# Patient Record
Sex: Female | Born: 1955 | Race: White | Hispanic: No | Marital: Married | Smoking: Former smoker
Health system: Southern US, Community
[De-identification: ages and names within clinical notes are randomized; demographics above are authoritative.]

## PROBLEM LIST (undated history)

## (undated) DIAGNOSIS — M1990 Unspecified osteoarthritis, unspecified site: Secondary | ICD-10-CM

## (undated) DIAGNOSIS — N200 Calculus of kidney: Secondary | ICD-10-CM

## (undated) DIAGNOSIS — A310 Pulmonary mycobacterial infection: Secondary | ICD-10-CM

## (undated) DIAGNOSIS — I7300 Raynaud's syndrome without gangrene: Secondary | ICD-10-CM

## (undated) DIAGNOSIS — B4481 Allergic bronchopulmonary aspergillosis: Secondary | ICD-10-CM

## (undated) DIAGNOSIS — I480 Paroxysmal atrial fibrillation: Secondary | ICD-10-CM

## (undated) DIAGNOSIS — M797 Fibromyalgia: Secondary | ICD-10-CM

## (undated) DIAGNOSIS — L4050 Arthropathic psoriasis, unspecified: Secondary | ICD-10-CM

## (undated) DIAGNOSIS — Z9221 Personal history of antineoplastic chemotherapy: Secondary | ICD-10-CM

## (undated) DIAGNOSIS — M549 Dorsalgia, unspecified: Secondary | ICD-10-CM

## (undated) DIAGNOSIS — J45909 Unspecified asthma, uncomplicated: Secondary | ICD-10-CM

## (undated) DIAGNOSIS — I341 Nonrheumatic mitral (valve) prolapse: Secondary | ICD-10-CM

## (undated) DIAGNOSIS — M329 Systemic lupus erythematosus, unspecified: Secondary | ICD-10-CM

## (undated) DIAGNOSIS — F419 Anxiety disorder, unspecified: Secondary | ICD-10-CM

## (undated) DIAGNOSIS — M3500 Sicca syndrome, unspecified: Secondary | ICD-10-CM

## (undated) DIAGNOSIS — I499 Cardiac arrhythmia, unspecified: Secondary | ICD-10-CM

## (undated) DIAGNOSIS — IMO0002 Reserved for concepts with insufficient information to code with codable children: Secondary | ICD-10-CM

## (undated) DIAGNOSIS — K5790 Diverticulosis of intestine, part unspecified, without perforation or abscess without bleeding: Secondary | ICD-10-CM

## (undated) DIAGNOSIS — M810 Age-related osteoporosis without current pathological fracture: Secondary | ICD-10-CM

## (undated) DIAGNOSIS — J449 Chronic obstructive pulmonary disease, unspecified: Secondary | ICD-10-CM

## (undated) DIAGNOSIS — R569 Unspecified convulsions: Secondary | ICD-10-CM

## (undated) DIAGNOSIS — F32A Depression, unspecified: Secondary | ICD-10-CM

## (undated) DIAGNOSIS — K589 Irritable bowel syndrome without diarrhea: Secondary | ICD-10-CM

## (undated) DIAGNOSIS — G43909 Migraine, unspecified, not intractable, without status migrainosus: Secondary | ICD-10-CM

## (undated) DIAGNOSIS — K219 Gastro-esophageal reflux disease without esophagitis: Secondary | ICD-10-CM

## (undated) DIAGNOSIS — R011 Cardiac murmur, unspecified: Secondary | ICD-10-CM

## (undated) HISTORY — PX: CHOLECYSTECTOMY: SHX55

## (undated) HISTORY — PX: BACK SURGERY: SHX140

## (undated) HISTORY — PX: BREAST BIOPSY: SHX20

## (undated) HISTORY — PX: ABDOMINAL HYSTERECTOMY: SHX81

## (undated) HISTORY — PX: OOPHORECTOMY: SHX86

## (undated) HISTORY — PX: TUBAL LIGATION: SHX77

## (undated) HISTORY — DX: Pulmonary mycobacterial infection: A31.0

---
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
## 1982-09-26 DIAGNOSIS — Z9221 Personal history of antineoplastic chemotherapy: Secondary | ICD-10-CM

## 1982-09-26 HISTORY — DX: Personal history of antineoplastic chemotherapy: Z92.21

## 2004-02-26 ENCOUNTER — Other Ambulatory Visit: Payer: Self-pay

## 2004-08-09 ENCOUNTER — Emergency Department: Payer: Self-pay | Admitting: General Practice

## 2004-08-13 ENCOUNTER — Ambulatory Visit: Payer: Self-pay | Admitting: Internal Medicine

## 2004-08-13 ENCOUNTER — Ambulatory Visit: Payer: Self-pay | Admitting: Cardiology

## 2004-08-18 ENCOUNTER — Emergency Department: Payer: Self-pay | Admitting: Emergency Medicine

## 2004-11-18 ENCOUNTER — Emergency Department: Payer: Self-pay | Admitting: Emergency Medicine

## 2004-11-18 ENCOUNTER — Other Ambulatory Visit: Payer: Self-pay

## 2004-11-19 ENCOUNTER — Ambulatory Visit: Payer: Self-pay | Admitting: Emergency Medicine

## 2005-01-06 ENCOUNTER — Ambulatory Visit: Payer: Self-pay | Admitting: Internal Medicine

## 2005-04-15 ENCOUNTER — Emergency Department: Payer: Self-pay | Admitting: Emergency Medicine

## 2005-04-16 ENCOUNTER — Ambulatory Visit: Payer: Self-pay | Admitting: Emergency Medicine

## 2005-04-21 ENCOUNTER — Ambulatory Visit: Payer: Self-pay | Admitting: Unknown Physician Specialty

## 2005-05-03 ENCOUNTER — Ambulatory Visit: Payer: Self-pay | Admitting: Unknown Physician Specialty

## 2005-05-25 ENCOUNTER — Ambulatory Visit: Payer: Self-pay | Admitting: General Surgery

## 2005-11-20 ENCOUNTER — Other Ambulatory Visit: Payer: Self-pay

## 2005-11-20 ENCOUNTER — Emergency Department: Payer: Self-pay | Admitting: Emergency Medicine

## 2006-01-08 ENCOUNTER — Emergency Department: Payer: Self-pay | Admitting: Emergency Medicine

## 2006-12-18 ENCOUNTER — Inpatient Hospital Stay: Payer: Self-pay | Admitting: Internal Medicine

## 2007-05-12 ENCOUNTER — Ambulatory Visit: Payer: Self-pay | Admitting: Rheumatology

## 2007-07-30 ENCOUNTER — Other Ambulatory Visit: Payer: Self-pay

## 2007-07-30 ENCOUNTER — Emergency Department: Payer: Self-pay | Admitting: Emergency Medicine

## 2008-02-05 ENCOUNTER — Emergency Department (HOSPITAL_COMMUNITY): Admission: EM | Admit: 2008-02-05 | Discharge: 2008-02-06 | Payer: Self-pay | Admitting: Emergency Medicine

## 2008-02-05 ENCOUNTER — Emergency Department: Payer: Self-pay | Admitting: Emergency Medicine

## 2008-03-16 ENCOUNTER — Emergency Department (HOSPITAL_COMMUNITY): Admission: EM | Admit: 2008-03-16 | Discharge: 2008-03-16 | Payer: Self-pay | Admitting: Emergency Medicine

## 2008-03-17 ENCOUNTER — Emergency Department (HOSPITAL_COMMUNITY): Admission: EM | Admit: 2008-03-17 | Discharge: 2008-03-17 | Payer: Self-pay | Admitting: Emergency Medicine

## 2008-03-30 ENCOUNTER — Observation Stay: Payer: Self-pay | Admitting: Internal Medicine

## 2008-03-30 ENCOUNTER — Other Ambulatory Visit: Payer: Self-pay

## 2008-05-06 ENCOUNTER — Encounter: Payer: Self-pay | Admitting: Neurology

## 2008-08-07 ENCOUNTER — Emergency Department (HOSPITAL_COMMUNITY): Admission: EM | Admit: 2008-08-07 | Discharge: 2008-08-07 | Payer: Self-pay | Admitting: Emergency Medicine

## 2008-09-20 ENCOUNTER — Emergency Department (HOSPITAL_COMMUNITY): Admission: EM | Admit: 2008-09-20 | Discharge: 2008-09-20 | Payer: Self-pay | Admitting: Emergency Medicine

## 2009-02-19 ENCOUNTER — Emergency Department (HOSPITAL_COMMUNITY): Admission: EM | Admit: 2009-02-19 | Discharge: 2009-02-20 | Payer: Self-pay | Admitting: Emergency Medicine

## 2009-03-04 ENCOUNTER — Emergency Department (HOSPITAL_COMMUNITY): Admission: EM | Admit: 2009-03-04 | Discharge: 2009-03-04 | Payer: Self-pay | Admitting: Family Medicine

## 2009-03-24 ENCOUNTER — Emergency Department (HOSPITAL_COMMUNITY): Admission: EM | Admit: 2009-03-24 | Discharge: 2009-03-24 | Payer: Self-pay | Admitting: Emergency Medicine

## 2009-05-19 ENCOUNTER — Other Ambulatory Visit: Payer: Self-pay

## 2009-05-19 ENCOUNTER — Ambulatory Visit: Payer: Self-pay | Admitting: Family Medicine

## 2009-05-23 ENCOUNTER — Telehealth: Payer: Self-pay | Admitting: Gastroenterology

## 2009-05-26 ENCOUNTER — Emergency Department (HOSPITAL_COMMUNITY): Admission: EM | Admit: 2009-05-26 | Discharge: 2009-05-26 | Payer: Self-pay | Admitting: Emergency Medicine

## 2009-05-26 ENCOUNTER — Encounter: Admission: RE | Admit: 2009-05-26 | Discharge: 2009-05-26 | Payer: Self-pay | Admitting: Gastroenterology

## 2009-09-19 ENCOUNTER — Emergency Department (HOSPITAL_COMMUNITY): Admission: EM | Admit: 2009-09-19 | Discharge: 2009-09-19 | Payer: Self-pay | Admitting: Emergency Medicine

## 2010-02-06 ENCOUNTER — Emergency Department (HOSPITAL_COMMUNITY): Admission: EM | Admit: 2010-02-06 | Discharge: 2010-02-06 | Payer: Self-pay | Admitting: Emergency Medicine

## 2010-03-10 ENCOUNTER — Ambulatory Visit: Payer: Self-pay | Admitting: Internal Medicine

## 2010-12-14 LAB — BASIC METABOLIC PANEL
BUN: 4 mg/dL — ABNORMAL LOW (ref 6–23)
CO2: 31 mEq/L (ref 19–32)
Calcium: 8.9 mg/dL (ref 8.4–10.5)
Chloride: 104 mEq/L (ref 96–112)
Creatinine, Ser: 0.51 mg/dL (ref 0.4–1.2)
GFR calc Af Amer: >60 mL/min (ref >60)
GFR calc non Af Amer: >60 mL/min (ref >60)
Glucose, Bld: 96 mg/dL (ref 70–99)
Potassium: 4.3 mEq/L (ref 3.5–5.1)
Sodium: 142 mEq/L (ref 135–145)

## 2010-12-14 LAB — URINALYSIS, ROUTINE W REFLEX MICROSCOPIC
Bilirubin Urine: NEGATIVE
Glucose, UA: NEGATIVE mg/dL
Hgb urine dipstick: NEGATIVE
Ketones, ur: NEGATIVE mg/dL
Nitrite: NEGATIVE
Protein, ur: NEGATIVE mg/dL
Specific Gravity, Urine: 1.009 (ref 1.005–1.030)
Urobilinogen, UA: 0.2 mg/dL (ref 0.0–1.0)
pH: 6 (ref 5.0–8.0)

## 2010-12-14 LAB — CBC
HCT: 42.5 % (ref 36.0–46.0)
Hemoglobin: 14.6 g/dL (ref 12.0–15.0)
MCHC: 34.3 g/dL (ref 30.0–36.0)
MCV: 92 fL (ref 78.0–100.0)
Platelets: 250 10*3/uL (ref 150–400)
RBC: 4.62 MIL/uL (ref 3.87–5.11)
RDW: 12.5 % (ref 11.5–15.5)
WBC: 8.5 10*3/uL (ref 4.0–10.5)

## 2010-12-14 LAB — DIFFERENTIAL
Basophils Absolute: 0 10*3/uL (ref 0.0–0.1)
Basophils Relative: 0 % (ref 0–1)
Eosinophils Absolute: 0.1 10*3/uL (ref 0.0–0.7)
Eosinophils Relative: 1 % (ref 0–5)
Lymphocytes Relative: 36 % (ref 12–46)
Lymphs Abs: 3 10*3/uL (ref 0.7–4.0)
Monocytes Absolute: 0.6 10*3/uL (ref 0.1–1.0)
Monocytes Relative: 7 % (ref 3–12)
Neutro Abs: 4.7 10*3/uL (ref 1.7–7.7)
Neutrophils Relative %: 55 % (ref 43–77)

## 2011-01-01 LAB — BASIC METABOLIC PANEL
BUN: 4 mg/dL — ABNORMAL LOW (ref 6–23)
CO2: 28 mEq/L (ref 19–32)
Calcium: 9.5 mg/dL (ref 8.4–10.5)
Chloride: 102 mEq/L (ref 96–112)
Creatinine, Ser: 0.49 mg/dL (ref 0.4–1.2)
GFR calc Af Amer: >60 mL/min (ref >60)
GFR calc non Af Amer: >60 mL/min (ref >60)
Glucose, Bld: 89 mg/dL (ref 70–99)
Potassium: 4.2 mEq/L (ref 3.5–5.1)
Sodium: 139 mEq/L (ref 135–145)

## 2011-01-01 LAB — POCT CARDIAC MARKERS
CKMB, poc: <1 ng/mL — ABNORMAL LOW (ref 1.0–8.0)
CKMB, poc: <1 ng/mL — ABNORMAL LOW (ref 1.0–8.0)
Myoglobin, poc: 58.3 ng/mL (ref 12–200)
Myoglobin, poc: 72.7 ng/mL (ref 12–200)
Troponin i, poc: <0.05 ng/mL (ref 0.00–0.09)
Troponin i, poc: <0.05 ng/mL (ref 0.00–0.09)

## 2011-01-01 LAB — CBC
HCT: 45.3 % (ref 36.0–46.0)
Hemoglobin: 15.4 g/dL — ABNORMAL HIGH (ref 12.0–15.0)
MCHC: 34.1 g/dL (ref 30.0–36.0)
MCV: 91.2 fL (ref 78.0–100.0)
Platelets: 264 10*3/uL (ref 150–400)
RBC: 4.97 MIL/uL (ref 3.87–5.11)
RDW: 12.9 % (ref 11.5–15.5)
WBC: 11.7 10*3/uL — ABNORMAL HIGH (ref 4.0–10.5)

## 2011-01-01 LAB — DIFFERENTIAL
Basophils Absolute: 0 10*3/uL (ref 0.0–0.1)
Basophils Relative: 0 % (ref 0–1)
Eosinophils Absolute: 0 10*3/uL (ref 0.0–0.7)
Eosinophils Relative: 0 % (ref 0–5)
Lymphocytes Relative: 27 % (ref 12–46)
Lymphs Abs: 3.2 10*3/uL (ref 0.7–4.0)
Monocytes Absolute: 0.7 10*3/uL (ref 0.1–1.0)
Monocytes Relative: 6 % (ref 3–12)
Neutro Abs: 7.7 10*3/uL (ref 1.7–7.7)
Neutrophils Relative %: 66 % (ref 43–77)

## 2011-01-03 LAB — GLUCOSE, CAPILLARY: Glucose-Capillary: 75 mg/dL (ref 70–99)

## 2011-01-03 LAB — URINE CULTURE: Colony Count: 4000

## 2011-01-03 LAB — COMPREHENSIVE METABOLIC PANEL
ALT: 21 U/L (ref 0–35)
AST: 28 U/L (ref 0–37)
Albumin: 3.5 g/dL (ref 3.5–5.2)
Alkaline Phosphatase: 82 U/L (ref 39–117)
BUN: 5 mg/dL — ABNORMAL LOW (ref 6–23)
CO2: 26 mEq/L (ref 19–32)
Calcium: 9 mg/dL (ref 8.4–10.5)
Chloride: 104 mEq/L (ref 96–112)
Creatinine, Ser: 0.64 mg/dL (ref 0.4–1.2)
GFR calc Af Amer: >60 mL/min (ref >60)
GFR calc non Af Amer: >60 mL/min (ref >60)
Glucose, Bld: 96 mg/dL (ref 70–99)
Potassium: 4.6 mEq/L (ref 3.5–5.1)
Sodium: 140 mEq/L (ref 135–145)
Total Bilirubin: 1.3 mg/dL — ABNORMAL HIGH (ref 0.3–1.2)
Total Protein: 7.4 g/dL (ref 6.0–8.3)

## 2011-01-03 LAB — DIFFERENTIAL
Basophils Absolute: 0 10*3/uL (ref 0.0–0.1)
Basophils Relative: 0 % (ref 0–1)
Eosinophils Absolute: 0 10*3/uL (ref 0.0–0.7)
Eosinophils Relative: 0 % (ref 0–5)
Lymphocytes Relative: 16 % (ref 12–46)
Lymphs Abs: 2.2 10*3/uL (ref 0.7–4.0)
Monocytes Absolute: 0.9 10*3/uL (ref 0.1–1.0)
Monocytes Relative: 7 % (ref 3–12)
Neutro Abs: 10.3 10*3/uL — ABNORMAL HIGH (ref 1.7–7.7)
Neutrophils Relative %: 76 % (ref 43–77)

## 2011-01-03 LAB — CBC
HCT: 41.9 % (ref 36.0–46.0)
Hemoglobin: 14.3 g/dL (ref 12.0–15.0)
MCHC: 34.2 g/dL (ref 30.0–36.0)
MCV: 91 fL (ref 78.0–100.0)
Platelets: 255 10*3/uL (ref 150–400)
RBC: 4.6 MIL/uL (ref 3.87–5.11)
RDW: 13.1 % (ref 11.5–15.5)
WBC: 13.5 10*3/uL — ABNORMAL HIGH (ref 4.0–10.5)

## 2011-01-03 LAB — URINALYSIS, ROUTINE W REFLEX MICROSCOPIC
Bilirubin Urine: NEGATIVE
Glucose, UA: NEGATIVE mg/dL
Hgb urine dipstick: NEGATIVE
Ketones, ur: NEGATIVE mg/dL
Nitrite: NEGATIVE
Protein, ur: NEGATIVE mg/dL
Specific Gravity, Urine: 1.007 (ref 1.005–1.030)
Urobilinogen, UA: 0.2 mg/dL (ref 0.0–1.0)
pH: 8 (ref 5.0–8.0)

## 2011-01-05 ENCOUNTER — Ambulatory Visit: Payer: Self-pay | Admitting: Internal Medicine

## 2011-01-28 ENCOUNTER — Encounter: Payer: Self-pay | Admitting: Orthopedic Surgery

## 2011-02-08 NOTE — Consult Note (Signed)
NAMESAMAR, VENNEMAN            ACCOUNT NO.:  [id number]   MEDICAL RECORD NO.:  [id number]          PATIENT TYPE:  EMS   LOCATION:  [facility]                         FACILITY:  [facility]   PHYSICIAN:  Della Goo, M.D. DATE OF BIRTH:  [birth year]   DATE OF CONSULTATION:  09/20/2008  DATE OF DISCHARGE:                                 CONSULTATION   CHIEF COMPLAINT:  Shortness of breath and wheezing.   HISTORY OF PRESENT ILLNESS:  This is a 55 year old female who presents  to the emergency department secondary to complaints of increasing  shortness of breath, cough and wheezing.  The patient has a history of  asthma and reports being seen at [facility] 2 days ago and being  evaluated and found to have a viral bronchitis.  She presented to the  emergency department at [facility] secondary to pleuritic chest  discomfort which she reports is waxing and waning.  The patient was  evaluated in the emergency department and was noticed to have an  abnormal electrocardiographic tracing.  Cardiac enzymes were started and  the patient was retained for further evaluation.  The patient was  treated for her symptoms with pain medication.  The patient had point of  care cardiac markers performed, two of which were negative, and an EKG  performed which revealed upward sloping of the ST-segment and this  abnormality was discussed with the patient who reports that this is a  chronic finding of which she has had multiple cardiac evaluations and  reports having a Myoview study performed in July of 2009 with normal  findings.  The patient reports that she also has followup with a  cardiologist at [facility] and a cardiologist in [place] who is  her primary care physician.  The patient denies having any fevers,  chills, but does report coughing up a clear mucus.  She also reports  having an elevated white count, however, reports the white count has  improved from the report 2 days ago  at [facility].   PAST MEDICAL HISTORY:  Significant for asthma, arthritis, atrial  fibrillation, mitral valve prolapse.  The patient has also a history of  scoliosis and is status post Harrington rod placement.   PAST SURGICAL HISTORY:  History of a cholecystectomy.  Please at the  past medical history.   ALLERGIES:  The patient has multiple listed and documented allergies to  CEPHALOSPORIN, SULFA, AMOXICILLIN, VANCOMYCIN, AZITHROMYCIN,  METRONIDAZOLE, VANCOMYCIN, PREDNISONE, VICODIN, FLUOROQUINOLONES along  with ALBUTEROL.   MEDICATIONS:  1. Her medications at this time include Flonase.  2. Magnesium.  3. Aspirin.  4. Motrin.  5. Multivitamin.  6. Flexhaler 2 puffs b.i.d.  7. Tenormin.  8. Pulmicort.  9. Xanax.  10.Protonix.  11.She was recently started on Tamiflu therapy.   SOCIAL HISTORY:  The patient is married.  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  She is a nonsmoker, nondrinker.   FAMILY HISTORY:  Noncontributory.   PHYSICAL EXAMINATION:  GENERAL:  This is a 55 year old well-nourished,  well-developed female in no discomfort or acute distress.  VITAL SIGNS:  Her  vital signs are temperature 98.6, blood pressure  142/86, heart rate 85, respirations 16.  HEENT:  Normocephalic, atraumatic.  Pupils equal, round, reactive to  light.  Extraocular movements are intact.  Funduscopic benign.  Oropharynx is clear.  No exudates, erythema.  No tonsillar adenopathy.  NECK:  Supple for range of motion.  No thyromegaly, adenopathy, jugular  venous distention.  No carotid bruits.  CARDIOVASCULAR:  Regular rate and rhythm.  No murmurs, gallops or rubs  appreciated.  LUNGS:  Lungs are clear to auscultation bilaterally.  No rales, rhonchi  or wheezes.  ABDOMEN:  Positive bowel sounds, soft, nontender, nondistended.  EXTREMITIES:  Without cyanosis, clubbing or edema.  NEUROLOGICAL:  Alert and oriented x3.  Cranial nerves are intact.  Motor  and sensory function  also intact and gait is steady.   LABORATORY STUDIES:  White blood cell count 10.7, hemoglobin 14.7,  hematocrit 45.0, platelets 333.  Sodium 140, potassium 4.4, chloride  103, bicarb 30, BUN 7, creatinine 0.8 and glucose 82.  Point of care  cardiac markers with a myoglobin level 47.6, a CK-MB less than 1.0 and  troponin less than 0.05.  A second set of point of care cardiac markers  revealed a myoglobin of 43.6, a CK-MB of less than 1.0 and a troponin of  less than 0.05.  Beta natriuretic peptide less than 30.0.  EKG reveals a  normal sinus rhythm with diffuse ST elevation with upward  sloping/concavity in the ST-segment.  Chest x-ray reveals no acute  disease process and hyperaeration of both lungs.  The patient has  dextroscoliosis and the Harrington rod is present in the lower thoracic  and lumbar region.   ASSESSMENT:  A 55 year old female with pleuritic chest pain and viral  bronchitis/mild asthma exacerbation.   PLAN:  The results of the patient's cardiac studies have been discussed  with the patient and the patient will follow up with her cardiologist in  [facility] and her cardiologist at [facility].  The  patient also understands that if her symptoms worsen that she is to  return to the emergency department for reevaluation.  The patient will  continue on her guaifenesin that she has been taking q.6 hours for  coughing.  She will also continue on her flexhaler as she had been  instructed by her pulmonologist.      Della Goo, M.D.  Electronically Signed     HJ/MEDQ  D:  09/21/2008  T:  09/21/2008  Job:  [id number]

## 2011-02-25 ENCOUNTER — Encounter: Payer: Self-pay | Admitting: Orthopedic Surgery

## 2011-03-12 ENCOUNTER — Emergency Department (HOSPITAL_COMMUNITY): Payer: Medicare Other

## 2011-03-12 ENCOUNTER — Emergency Department (HOSPITAL_COMMUNITY)
Admission: EM | Admit: 2011-03-12 | Discharge: 2011-03-12 | Disposition: A | Discharge status: Home / Self Care | Payer: Medicare Other | Attending: Emergency Medicine | Admitting: Emergency Medicine

## 2011-03-12 DIAGNOSIS — I4891 Unspecified atrial fibrillation: Secondary | ICD-10-CM | POA: Insufficient documentation

## 2011-03-12 DIAGNOSIS — Z7982 Long term (current) use of aspirin: Secondary | ICD-10-CM | POA: Insufficient documentation

## 2011-03-12 DIAGNOSIS — M3500 Sicca syndrome, unspecified: Secondary | ICD-10-CM | POA: Insufficient documentation

## 2011-03-12 DIAGNOSIS — Z8541 Personal history of malignant neoplasm of cervix uteri: Secondary | ICD-10-CM | POA: Insufficient documentation

## 2011-03-12 DIAGNOSIS — K589 Irritable bowel syndrome without diarrhea: Secondary | ICD-10-CM | POA: Insufficient documentation

## 2011-03-12 DIAGNOSIS — I498 Other specified cardiac arrhythmias: Secondary | ICD-10-CM | POA: Insufficient documentation

## 2011-03-12 DIAGNOSIS — J45909 Unspecified asthma, uncomplicated: Secondary | ICD-10-CM | POA: Insufficient documentation

## 2011-03-12 DIAGNOSIS — Z79899 Other long term (current) drug therapy: Secondary | ICD-10-CM | POA: Insufficient documentation

## 2011-03-12 DIAGNOSIS — R002 Palpitations: Secondary | ICD-10-CM | POA: Insufficient documentation

## 2011-03-12 LAB — DIFFERENTIAL
Basophils Absolute: 0 10*3/uL (ref 0.0–0.1)
Basophils Relative: 0 % (ref 0–1)
Eosinophils Absolute: 0 10*3/uL (ref 0.0–0.7)
Eosinophils Relative: 0 % (ref 0–5)
Lymphocytes Relative: 38 % (ref 12–46)
Lymphs Abs: 4.9 10*3/uL — ABNORMAL HIGH (ref 0.7–4.0)
Monocytes Absolute: 0.8 10*3/uL (ref 0.1–1.0)
Monocytes Relative: 7 % (ref 3–12)
Neutro Abs: 6.9 10*3/uL (ref 1.7–7.7)
Neutrophils Relative %: 54 % (ref 43–77)

## 2011-03-12 LAB — ETHANOL: Alcohol, Ethyl (B): <11 mg/dL (ref 0–11)

## 2011-03-12 LAB — URINALYSIS, ROUTINE W REFLEX MICROSCOPIC
Bilirubin Urine: NEGATIVE
Glucose, UA: NEGATIVE mg/dL
Hgb urine dipstick: NEGATIVE
Ketones, ur: >80 mg/dL — AB
Leukocytes, UA: NEGATIVE
Nitrite: NEGATIVE
Protein, ur: NEGATIVE mg/dL
Specific Gravity, Urine: 1.009 (ref 1.005–1.030)
Urobilinogen, UA: 0.2 mg/dL (ref 0.0–1.0)
pH: 8 (ref 5.0–8.0)

## 2011-03-12 LAB — CBC
HCT: 47.9 % — ABNORMAL HIGH (ref 36.0–46.0)
Hemoglobin: 17 g/dL — ABNORMAL HIGH (ref 12.0–15.0)
MCH: 32 pg (ref 26.0–34.0)
MCHC: 35.5 g/dL (ref 30.0–36.0)
MCV: 90.2 fL (ref 78.0–100.0)
Platelets: 311 10*3/uL (ref 150–400)
RBC: 5.31 MIL/uL — ABNORMAL HIGH (ref 3.87–5.11)
RDW: 12.7 % (ref 11.5–15.5)
WBC: 12.7 10*3/uL — ABNORMAL HIGH (ref 4.0–10.5)

## 2011-03-12 LAB — BASIC METABOLIC PANEL
BUN: 9 mg/dL (ref 6–23)
CO2: 25 mEq/L (ref 19–32)
Calcium: 10 mg/dL (ref 8.4–10.5)
Chloride: 101 mEq/L (ref 96–112)
Creatinine, Ser: 0.5 mg/dL (ref 0.50–1.10)
GFR calc Af Amer: >60 mL/min (ref >60)
GFR calc non Af Amer: >60 mL/min (ref >60)
Glucose, Bld: 95 mg/dL (ref 70–99)
Potassium: 3.3 mEq/L — ABNORMAL LOW (ref 3.5–5.1)
Sodium: 139 mEq/L (ref 135–145)

## 2011-03-12 LAB — RAPID URINE DRUG SCREEN, HOSP PERFORMED
Amphetamines: NOT DETECTED
Barbiturates: NOT DETECTED
Benzodiazepines: POSITIVE — AB
Cocaine: NOT DETECTED
Opiates: NOT DETECTED
Tetrahydrocannabinol: NOT DETECTED

## 2011-03-12 LAB — APTT: aPTT: 34 seconds (ref 24–37)

## 2011-03-12 LAB — CK TOTAL AND CKMB (NOT AT ARMC)
CK, MB: 3 ng/mL (ref 0.3–4.0)
Relative Index: 1.9 (ref 0.0–2.5)
Total CK: 159 U/L (ref 7–177)

## 2011-03-12 LAB — PROTIME-INR
INR: 0.93 (ref 0.00–1.49)
Prothrombin Time: 12.7 seconds (ref 11.6–15.2)

## 2011-03-12 LAB — TROPONIN I: Troponin I: <0.3 ng/mL (ref <0.30)

## 2011-03-27 ENCOUNTER — Encounter: Payer: Self-pay | Admitting: Orthopedic Surgery

## 2011-06-28 LAB — COMPREHENSIVE METABOLIC PANEL
ALT: 24
AST: 29
Albumin: 3.7
Alkaline Phosphatase: 83
BUN: 6
CO2: 28
Calcium: 9.5
Chloride: 97
Creatinine, Ser: 0.66
GFR calc Af Amer: >60
GFR calc non Af Amer: >60
Glucose, Bld: 93
Potassium: 4.3
Sodium: 137
Total Bilirubin: 0.8
Total Protein: 7.2

## 2011-06-28 LAB — CBC
HCT: 43.3
Hemoglobin: 14.6
MCHC: 33.6
MCV: 91.2
Platelets: 278
RBC: 4.75
RDW: 13
WBC: 9.7

## 2011-06-28 LAB — DIFFERENTIAL
Basophils Absolute: 0
Basophils Relative: 0
Eosinophils Absolute: 0.1
Eosinophils Relative: 1
Lymphocytes Relative: 24
Lymphs Abs: 2.3
Monocytes Absolute: 0.6
Monocytes Relative: 6
Neutro Abs: 6.7
Neutrophils Relative %: 69

## 2011-06-28 LAB — URINALYSIS, ROUTINE W REFLEX MICROSCOPIC
Bilirubin Urine: NEGATIVE
Glucose, UA: NEGATIVE
Hgb urine dipstick: NEGATIVE
Ketones, ur: NEGATIVE
Nitrite: NEGATIVE
Protein, ur: NEGATIVE
Specific Gravity, Urine: 1.007
Urobilinogen, UA: 0.2
pH: 6.5

## 2011-06-28 LAB — LIPASE, BLOOD: Lipase: 28

## 2011-07-01 LAB — POCT I-STAT, CHEM 8
BUN: 7 mg/dL (ref 6–23)
Calcium, Ion: 1.13 mmol/L (ref 1.12–1.32)
Chloride: 103 mEq/L (ref 96–112)
Creatinine, Ser: 0.8 mg/dL (ref 0.4–1.2)
Glucose, Bld: 82 mg/dL (ref 70–99)
HCT: 46 % (ref 36.0–46.0)
Hemoglobin: 15.6 g/dL — ABNORMAL HIGH (ref 12.0–15.0)
Potassium: 4.4 mEq/L (ref 3.5–5.1)
Sodium: 140 mEq/L (ref 135–145)
TCO2: 30 mmol/L (ref 0–100)

## 2011-07-01 LAB — POCT CARDIAC MARKERS
CKMB, poc: <1 ng/mL — ABNORMAL LOW (ref 1.0–8.0)
CKMB, poc: <1 ng/mL — ABNORMAL LOW (ref 1.0–8.0)
Myoglobin, poc: 43.6 ng/mL (ref 12–200)
Myoglobin, poc: 47.6 ng/mL (ref 12–200)
Troponin i, poc: <0.05 ng/mL (ref 0.00–0.09)
Troponin i, poc: <0.05 ng/mL (ref 0.00–0.09)

## 2011-07-01 LAB — CBC
HCT: 45 % (ref 36.0–46.0)
Hemoglobin: 14.7 g/dL (ref 12.0–15.0)
MCHC: 32.8 g/dL (ref 30.0–36.0)
MCV: 91.9 fL (ref 78.0–100.0)
Platelets: 333 10*3/uL (ref 150–400)
RBC: 4.89 MIL/uL (ref 3.87–5.11)
RDW: 13 % (ref 11.5–15.5)
WBC: 10.7 10*3/uL — ABNORMAL HIGH (ref 4.0–10.5)

## 2011-07-01 LAB — B-NATRIURETIC PEPTIDE (CONVERTED LAB): Pro B Natriuretic peptide (BNP): <30 pg/mL (ref 0.0–100.0)

## 2011-07-04 ENCOUNTER — Encounter (HOSPITAL_COMMUNITY): Payer: Self-pay | Admitting: Radiology

## 2011-07-04 ENCOUNTER — Emergency Department (HOSPITAL_COMMUNITY): Payer: Medicare Other

## 2011-07-04 ENCOUNTER — Emergency Department (HOSPITAL_COMMUNITY)
Admission: EM | Admit: 2011-07-04 | Discharge: 2011-07-04 | Disposition: A | Discharge status: Home / Self Care | Payer: Medicare Other | Attending: Emergency Medicine | Admitting: Emergency Medicine

## 2011-07-04 DIAGNOSIS — R1013 Epigastric pain: Secondary | ICD-10-CM | POA: Insufficient documentation

## 2011-07-04 DIAGNOSIS — K589 Irritable bowel syndrome without diarrhea: Secondary | ICD-10-CM | POA: Insufficient documentation

## 2011-07-04 DIAGNOSIS — J45909 Unspecified asthma, uncomplicated: Secondary | ICD-10-CM | POA: Insufficient documentation

## 2011-07-04 DIAGNOSIS — M3500 Sicca syndrome, unspecified: Secondary | ICD-10-CM | POA: Insufficient documentation

## 2011-07-04 DIAGNOSIS — I059 Rheumatic mitral valve disease, unspecified: Secondary | ICD-10-CM | POA: Insufficient documentation

## 2011-07-04 DIAGNOSIS — E876 Hypokalemia: Secondary | ICD-10-CM | POA: Insufficient documentation

## 2011-07-04 DIAGNOSIS — K3189 Other diseases of stomach and duodenum: Secondary | ICD-10-CM | POA: Insufficient documentation

## 2011-07-04 DIAGNOSIS — R1032 Left lower quadrant pain: Secondary | ICD-10-CM | POA: Insufficient documentation

## 2011-07-04 DIAGNOSIS — M4120 Other idiopathic scoliosis, site unspecified: Secondary | ICD-10-CM | POA: Insufficient documentation

## 2011-07-04 DIAGNOSIS — R110 Nausea: Secondary | ICD-10-CM | POA: Insufficient documentation

## 2011-07-04 LAB — DIFFERENTIAL
Basophils Absolute: 0.1 10*3/uL (ref 0.0–0.1)
Basophils Relative: 1 % (ref 0–1)
Eosinophils Absolute: 0.1 10*3/uL (ref 0.0–0.7)
Eosinophils Relative: 1 % (ref 0–5)
Lymphocytes Relative: 36 % (ref 12–46)
Lymphs Abs: 3 10*3/uL (ref 0.7–4.0)
Monocytes Absolute: 0.8 10*3/uL (ref 0.1–1.0)
Monocytes Relative: 9 % (ref 3–12)
Neutro Abs: 4.6 10*3/uL (ref 1.7–7.7)
Neutrophils Relative %: 54 % (ref 43–77)

## 2011-07-04 LAB — COMPREHENSIVE METABOLIC PANEL
ALT: 18 U/L (ref 0–35)
AST: 23 U/L (ref 0–37)
Albumin: 3.4 g/dL — ABNORMAL LOW (ref 3.5–5.2)
Alkaline Phosphatase: 74 U/L (ref 39–117)
BUN: 4 mg/dL — ABNORMAL LOW (ref 6–23)
CO2: 27 mEq/L (ref 19–32)
Calcium: 8.7 mg/dL (ref 8.4–10.5)
Chloride: 104 mEq/L (ref 96–112)
Creatinine, Ser: 0.49 mg/dL — ABNORMAL LOW (ref 0.50–1.10)
GFR calc Af Amer: >90 mL/min (ref >90)
GFR calc non Af Amer: >90 mL/min (ref >90)
Glucose, Bld: 97 mg/dL (ref 70–99)
Potassium: 2.9 mEq/L — ABNORMAL LOW (ref 3.5–5.1)
Sodium: 139 mEq/L (ref 135–145)
Total Bilirubin: 0.6 mg/dL (ref 0.3–1.2)
Total Protein: 6.7 g/dL (ref 6.0–8.3)

## 2011-07-04 LAB — URINALYSIS, ROUTINE W REFLEX MICROSCOPIC
Bilirubin Urine: NEGATIVE
Glucose, UA: NEGATIVE mg/dL
Hgb urine dipstick: NEGATIVE
Ketones, ur: NEGATIVE mg/dL
Leukocytes, UA: NEGATIVE
Nitrite: NEGATIVE
Protein, ur: NEGATIVE mg/dL
Specific Gravity, Urine: 1.002 — ABNORMAL LOW (ref 1.005–1.030)
Urobilinogen, UA: 0.2 mg/dL (ref 0.0–1.0)
pH: 6.5 (ref 5.0–8.0)

## 2011-07-04 LAB — CBC
HCT: 40 % (ref 36.0–46.0)
Hemoglobin: 13.3 g/dL (ref 12.0–15.0)
MCH: 30.2 pg (ref 26.0–34.0)
MCHC: 33.3 g/dL (ref 30.0–36.0)
MCV: 90.7 fL (ref 78.0–100.0)
Platelets: 238 10*3/uL (ref 150–400)
RBC: 4.41 MIL/uL (ref 3.87–5.11)
RDW: 12.4 % (ref 11.5–15.5)
WBC: 8.5 10*3/uL (ref 4.0–10.5)

## 2011-07-04 LAB — LACTIC ACID, PLASMA: Lactic Acid, Venous: 1.2 mmol/L (ref 0.5–2.2)

## 2011-07-04 MED ORDER — IOHEXOL 300 MG/ML  SOLN: 80.0000 mL | Freq: Once | INTRAMUSCULAR | Status: DC | PRN

## 2012-03-31 ENCOUNTER — Emergency Department (HOSPITAL_COMMUNITY)
Admission: EM | Admit: 2012-03-31 | Discharge: 2012-03-31 | Disposition: A | Discharge status: Home / Self Care | Payer: Medicare Other | Attending: Emergency Medicine | Admitting: Emergency Medicine

## 2012-03-31 ENCOUNTER — Emergency Department (HOSPITAL_COMMUNITY): Payer: Medicare Other

## 2012-03-31 ENCOUNTER — Encounter (HOSPITAL_COMMUNITY): Payer: Self-pay | Admitting: Emergency Medicine

## 2012-03-31 DIAGNOSIS — J4489 Other specified chronic obstructive pulmonary disease: Secondary | ICD-10-CM | POA: Insufficient documentation

## 2012-03-31 DIAGNOSIS — S2239XA Fracture of one rib, unspecified side, initial encounter for closed fracture: Secondary | ICD-10-CM

## 2012-03-31 DIAGNOSIS — S2249XA Multiple fractures of ribs, unspecified side, initial encounter for closed fracture: Secondary | ICD-10-CM | POA: Insufficient documentation

## 2012-03-31 DIAGNOSIS — W010XXA Fall on same level from slipping, tripping and stumbling without subsequent striking against object, initial encounter: Secondary | ICD-10-CM | POA: Insufficient documentation

## 2012-03-31 DIAGNOSIS — J449 Chronic obstructive pulmonary disease, unspecified: Secondary | ICD-10-CM | POA: Insufficient documentation

## 2012-03-31 DIAGNOSIS — I059 Rheumatic mitral valve disease, unspecified: Secondary | ICD-10-CM | POA: Insufficient documentation

## 2012-03-31 DIAGNOSIS — T07XXXA Unspecified multiple injuries, initial encounter: Secondary | ICD-10-CM | POA: Insufficient documentation

## 2012-03-31 DIAGNOSIS — Y92009 Unspecified place in unspecified non-institutional (private) residence as the place of occurrence of the external cause: Secondary | ICD-10-CM | POA: Insufficient documentation

## 2012-03-31 HISTORY — DX: Fibromyalgia: M79.7

## 2012-03-31 HISTORY — DX: Dorsalgia, unspecified: M54.9

## 2012-03-31 HISTORY — DX: Nonrheumatic mitral (valve) prolapse: I34.1

## 2012-03-31 HISTORY — DX: Chronic obstructive pulmonary disease, unspecified: J44.9

## 2012-03-31 HISTORY — DX: Cardiac arrhythmia, unspecified: I49.9

## 2012-03-31 MED ORDER — KETOROLAC TROMETHAMINE 60 MG/2ML IM SOLN
60.0000 mg | Freq: Once | INTRAMUSCULAR | Status: AC
  Administered 2012-03-31: 60 mg via INTRAMUSCULAR
  Filled 2012-03-31: qty 2

## 2012-03-31 MED ORDER — KETOROLAC TROMETHAMINE 10 MG PO TABS: 10.0000 mg | ORAL_TABLET | Freq: Four times a day (QID) | ORAL | Status: AC | PRN

## 2012-03-31 NOTE — ED Notes (Signed)
Pt states she fell while changing clothes  At 0100.  Pt states he foot got caught up into her pants and fell into a door jamb and landed on left hip.  C/o right  Rib pain 9/10 and when leaning forward it is a 10/10.  Denies LOC.

## 2012-03-31 NOTE — ED Provider Notes (Signed)
History     CSN: [id number]  Arrival date & time 03/31/12  0219   First MD Initiated Contact with Patient 03/31/12 [phone]      Chief Complaint  Patient presents with  . Fall    (Consider location/radiation/quality/duration/timing/severity/associated sxs/prior treatment) HPI Comments: According to the patient and her spouse she lost her balance while trying to change clothes this evening, fell landing on her right side against a wooden door jam and then to the floor bracing her fall with her right arm and leg. This was acute in onset, and occurred just prior to arrival. Her symptoms are persistent, moderate to severe, worse with taking a deep breath and not associated with neck pain or head injury. She has had no medication prior to arrival.  Patient is a 56 y.o. female presenting with fall. The history is provided by the patient and the spouse.  Fall Pertinent negatives include no fever, no nausea and no vomiting.    Past Medical History  Diagnosis Date  . Asthma   . Fibromyalgia   . Arrhythmia   . Back pain   . Mitral valve prolapse   . Chronic obstructive airway disease     Past Surgical History  Procedure Date  . Back surgery     No family history on file.  History  Substance Use Topics  . Smoking status: Never Smoker   . Smokeless tobacco: Not on file  . Alcohol Use: No    OB History    Grav Para Term Preterm Abortions TAB SAB Ect Mult Living                  Review of Systems  Constitutional: Negative for fever and chills.  Gastrointestinal: Negative for nausea and vomiting.  Musculoskeletal: Negative for back pain.  Skin: Positive for rash.       abscess    Allergies  Vancomycin; Vicodin; Albuterol; Amoxicillin; Cephalosporins; Ciprofloxacin; Macrobid; Sulfa antibiotics; Decongest-aid; and Prednisone  Home Medications   Current Outpatient Rx  Name Route Sig Dispense Refill  . ALPRAZOLAM 0.5 MG PO TABS Oral Take 0.5 mg by mouth 4 (four) times  daily.    . ASPIRIN 81 MG PO CHEW Oral Chew 81 mg by mouth daily.    . ATENOLOL 25 MG PO TABS Oral Take 12.5 mg by mouth daily.    . BUDESONIDE 180 MCG/ACT IN AEPB Inhalation Inhale 1 puff into the lungs 2 (two) times daily.    Marland Kitchen DOXYCYCLINE HYCLATE 100 MG PO TBEC Oral Take 100 mg by mouth 2 (two) times daily.    Marland Kitchen FLUTICASONE PROPIONATE 50 MCG/ACT NA SUSP Nasal Place 2 sprays into the nose daily.    . IPRATROPIUM BROMIDE HFA 17 MCG/ACT IN AERS Inhalation Inhale 2 puffs into the lungs 4 (four) times daily.    . LUBIPROSTONE 8 MCG PO CAPS Oral Take 8 mcg by mouth 2 (two) times daily with a meal.    . MAGNESIUM OXIDE 400 MG PO TABS Oral Take 400 mg by mouth daily.    . ADULT MULTIVITAMIN W/MINERALS CH Oral Take 1 tablet by mouth daily.    . TRAMADOL HCL 50 MG PO TABS Oral Take 50 mg by mouth every 6 (six) hours as needed. For pain    . KETOROLAC TROMETHAMINE 10 MG PO TABS Oral Take 1 tablet (10 mg total) by mouth every 6 (six) hours as needed for pain. 20 tablet 0    BP 153/90  Pulse 77  Temp 97.7  F (36.5 C) (Oral)  SpO2 100%  Physical Exam  Constitutional: She appears well-developed and well-nourished.       Uncomfortable appearing  HENT:  Head: Normocephalic.  Mouth/Throat: Oropharynx is clear and moist.  Eyes: Conjunctivae are normal. No scleral icterus.  Neck: Normal range of motion. Neck supple.  Cardiovascular: Normal rate and regular rhythm.   Pulmonary/Chest: Effort normal and breath sounds normal. No respiratory distress. She has no wheezes. She has no rales. She exhibits tenderness.  Abdominal: Soft. Bowel sounds are normal. She exhibits no distension. There is no tenderness. There is no rebound.       No right upper quadrant or left upper quadrant tenderness  Musculoskeletal: Normal range of motion. She exhibits tenderness (Tender to palpation over the right anterior lateral lower ribs, no crepitus, no subcutaneous emphysema). She exhibits no edema.  Neurological: She is  alert. Coordination normal.       Sensation and motor intact  Skin: Skin is warm and dry. She is not diaphoretic.       Contusions to the right shoulder, biceps, elbow, forearm, knee    ED Course  Procedures (including critical care time)  Labs Reviewed - No data to display Dg Ribs Unilateral W/chest Right  03/31/2012  *RADIOLOGY REPORT*  Clinical Data: Status post fall, with right lower rib pain.  Pain with breathing and motion.  RIGHT RIBS AND CHEST - 3+ VIEW  Comparison: Chest radiograph performed 03/12/2011  Findings: There are minimally displaced fractures of the right anterolateral eighth through tenth ribs.  The lungs are well-aerated and clear.  There is no evidence of focal opacification, pleural effusion or pneumothorax.  The cardiomediastinal silhouette is within normal limits.  No additional osseous abnormalities are seen; a thoracolumbar spinal rod is partially imaged.  Clips are noted within the right upper quadrant, reflecting prior cholecystectomy.  IMPRESSION:  1.  Minimally displaced fractures of the right anterolateral eighth through tenth ribs. 2.  No acute cardiopulmonary process seen.  Original Report Authenticated By: Tonia Ghent, M.D.     1. Rib fractures   2. Contusion of multiple sites       MDM  Multiple signs of contusion, rib fracture suspected based on exam, confirmed on x-ray. Intramuscular Toradol given, patient refuses opiate medications, vital signs normal, oxygenation 100% on room air and no signs of pneumothorax. No significant tenderness in the right upper quadrant to suggest liver injury, anticipate discharge home  Definitive Fracture Care:  Definitive fracture care performred for the ribs.  This included analgesia in the ED, incentive spiromoterly, which have been provided.  I have counseled the pt on possible complications of the fractures and signs and symptoms which would mandate return for further care.  Discharge Prescriptions  include:  Toradol         Vida Roller, MD 03/31/12 [phone]

## 2012-03-31 NOTE — ED Notes (Signed)
C/o 10 /10 right rib pain from fall at home.  Abrasions noted to RLE and RUE.  Family at bedside

## 2012-03-31 NOTE — ED Notes (Signed)
Pt states she lost her balance and fell tonight while changing pants.  C/o R sided rib pain, abrasion noted to R knee and R ankle.

## 2012-03-31 NOTE — ED Notes (Signed)
Instructions given for Incentive spirometry.  Pt did return demonstration of  2000cc times 4 reps

## 2012-06-24 ENCOUNTER — Emergency Department (HOSPITAL_COMMUNITY): Payer: Medicare Other

## 2012-06-24 ENCOUNTER — Encounter (HOSPITAL_COMMUNITY): Payer: Self-pay | Admitting: Physical Medicine and Rehabilitation

## 2012-06-24 ENCOUNTER — Emergency Department (HOSPITAL_COMMUNITY)
Admission: EM | Admit: 2012-06-24 | Discharge: 2012-06-24 | Disposition: A | Discharge status: Home / Self Care | Payer: Medicare Other | Attending: Emergency Medicine | Admitting: Emergency Medicine

## 2012-06-24 DIAGNOSIS — R1031 Right lower quadrant pain: Secondary | ICD-10-CM | POA: Insufficient documentation

## 2012-06-24 DIAGNOSIS — IMO0001 Reserved for inherently not codable concepts without codable children: Secondary | ICD-10-CM | POA: Insufficient documentation

## 2012-06-24 DIAGNOSIS — Z7982 Long term (current) use of aspirin: Secondary | ICD-10-CM | POA: Insufficient documentation

## 2012-06-24 DIAGNOSIS — R109 Unspecified abdominal pain: Secondary | ICD-10-CM

## 2012-06-24 DIAGNOSIS — G8929 Other chronic pain: Secondary | ICD-10-CM | POA: Insufficient documentation

## 2012-06-24 DIAGNOSIS — Z79899 Other long term (current) drug therapy: Secondary | ICD-10-CM | POA: Insufficient documentation

## 2012-06-24 DIAGNOSIS — J4489 Other specified chronic obstructive pulmonary disease: Secondary | ICD-10-CM | POA: Insufficient documentation

## 2012-06-24 DIAGNOSIS — J449 Chronic obstructive pulmonary disease, unspecified: Secondary | ICD-10-CM | POA: Insufficient documentation

## 2012-06-24 HISTORY — DX: Calculus of kidney: N20.0

## 2012-06-24 HISTORY — DX: Irritable bowel syndrome, unspecified: K58.9

## 2012-06-24 LAB — COMPREHENSIVE METABOLIC PANEL
ALT: 21 U/L (ref 0–35)
AST: 32 U/L (ref 0–37)
Albumin: 4.2 g/dL (ref 3.5–5.2)
Alkaline Phosphatase: 84 U/L (ref 39–117)
BUN: 6 mg/dL (ref 6–23)
CO2: 28 mEq/L (ref 19–32)
Calcium: 10 mg/dL (ref 8.4–10.5)
Chloride: 97 mEq/L (ref 96–112)
Creatinine, Ser: 0.66 mg/dL (ref 0.50–1.10)
GFR calc Af Amer: >90 mL/min (ref >90)
GFR calc non Af Amer: >90 mL/min (ref >90)
Glucose, Bld: 88 mg/dL (ref 70–99)
Potassium: 4.4 mEq/L (ref 3.5–5.1)
Sodium: 134 mEq/L — ABNORMAL LOW (ref 135–145)
Total Bilirubin: 1.1 mg/dL (ref 0.3–1.2)
Total Protein: 8.1 g/dL (ref 6.0–8.3)

## 2012-06-24 LAB — URINALYSIS, ROUTINE W REFLEX MICROSCOPIC
Bilirubin Urine: NEGATIVE
Glucose, UA: NEGATIVE mg/dL
Hgb urine dipstick: NEGATIVE
Ketones, ur: NEGATIVE mg/dL
Leukocytes, UA: NEGATIVE
Nitrite: NEGATIVE
Protein, ur: NEGATIVE mg/dL
Specific Gravity, Urine: 1.006 (ref 1.005–1.030)
Urobilinogen, UA: 0.2 mg/dL (ref 0.0–1.0)
pH: 6.5 (ref 5.0–8.0)

## 2012-06-24 LAB — CBC WITH DIFFERENTIAL/PLATELET
Basophils Absolute: 0.1 10*3/uL (ref 0.0–0.1)
Basophils Relative: 1 % (ref 0–1)
Eosinophils Absolute: 0.1 10*3/uL (ref 0.0–0.7)
Eosinophils Relative: 1 % (ref 0–5)
HCT: 46.2 % — ABNORMAL HIGH (ref 36.0–46.0)
Hemoglobin: 15.8 g/dL — ABNORMAL HIGH (ref 12.0–15.0)
Lymphocytes Relative: 32 % (ref 12–46)
Lymphs Abs: 2.7 10*3/uL (ref 0.7–4.0)
MCH: 31.5 pg (ref 26.0–34.0)
MCHC: 34.2 g/dL (ref 30.0–36.0)
MCV: 92.2 fL (ref 78.0–100.0)
Monocytes Absolute: 0.7 10*3/uL (ref 0.1–1.0)
Monocytes Relative: 9 % (ref 3–12)
Neutro Abs: 4.9 10*3/uL (ref 1.7–7.7)
Neutrophils Relative %: 58 % (ref 43–77)
Platelets: 249 10*3/uL (ref 150–400)
RBC: 5.01 MIL/uL (ref 3.87–5.11)
RDW: 12.8 % (ref 11.5–15.5)
WBC: 8.5 10*3/uL (ref 4.0–10.5)

## 2012-06-24 LAB — PREGNANCY, URINE: Preg Test, Ur: NEGATIVE

## 2012-06-24 MED ORDER — SODIUM CHLORIDE 0.9 % IV BOLUS (SEPSIS)
1000.0000 mL | Freq: Once | INTRAVENOUS | Status: AC
  Administered 2012-06-24: 1000 mL via INTRAVENOUS

## 2012-06-24 MED ORDER — LUBIPROSTONE 8 MCG PO CAPS: 8.0000 ug | ORAL_CAPSULE | Freq: Two times a day (BID) | ORAL | Status: DC

## 2012-06-24 MED ORDER — IOHEXOL 300 MG/ML  SOLN
80.0000 mL | Freq: Once | INTRAMUSCULAR | Status: AC | PRN
  Administered 2012-06-24: 80 mL via INTRAVENOUS

## 2012-06-24 NOTE — ED Notes (Signed)
Remains in ct scan.

## 2012-06-24 NOTE — ED Notes (Signed)
Pt presents to department for evaluation of RLQ abdominal pain and nausea. Ongoing x4 days. States 8/10 intermittent "sharp" pains. History of diverticulosis and chronic pain. Denies urinary symptoms. She is conscious alert and oriented x4.

## 2012-06-24 NOTE — ED Provider Notes (Signed)
History     CSN: [id number]  Arrival date & time 06/24/12  1128   First MD Initiated Contact with Patient 06/24/12 1510      Chief Complaint  Patient presents with  . Abdominal Pain    (Consider location/radiation/quality/duration/timing/severity/associated sxs/prior treatment) HPI Pt with a history of chronic back pain and kidney stones reports onset of RLQ pain about 4 days ago. States at first it was a sharp, fleeting pain, came and went without particular provoking factors, over the last 2 days it has been more of a gnawing pain, persistent, worse with movement and palpation. Non-radiating, associated with lack of appetite and nausea. No fever or vomiting. No diarrhea. She was seen at PCP office 2 days ago and had CBC which was reportedly normal. Advised to come to the ED if symptoms worsened.   Past Medical History  Diagnosis Date  . Asthma   . Fibromyalgia   . Arrhythmia   . Back pain   . Mitral valve prolapse   . Chronic obstructive airway disease   . Kidney stone   . Irritable bowel syndrome     Past Surgical History  Procedure Date  . Back surgery     History reviewed. No pertinent family history.  History  Substance Use Topics  . Smoking status: Never Smoker   . Smokeless tobacco: Not on file  . Alcohol Use: No    OB History    Grav Para Term Preterm Abortions TAB SAB Ect Mult Living                  Review of Systems All other systems reviewed and are negative except as noted in HPI.   Allergies  Vancomycin; Vicodin; Albuterol; Amoxicillin; Cephalosporins; Ciprofloxacin; Macrobid; Sulfa antibiotics; Decongest-aid; and Prednisone  Home Medications   Current Outpatient Rx  Name Route Sig Dispense Refill  . ALPRAZOLAM 0.5 MG PO TABS Oral Take 0.75 mg by mouth 4 (four) times daily.     . ASPIRIN 81 MG PO CHEW Oral Chew 81 mg by mouth daily.    . ATENOLOL 25 MG PO TABS Oral Take 12.5 mg by mouth daily.    . BUDESONIDE 180 MCG/ACT IN AEPB Inhalation  Inhale 1 puff into the lungs 2 (two) times daily.    Marland Kitchen DOXYCYCLINE HYCLATE 100 MG PO TBEC Oral Take 100 mg by mouth 2 (two) times daily as needed.     Marland Kitchen FLUTICASONE PROPIONATE 50 MCG/ACT NA SUSP Nasal Place 2 sprays into the nose daily.    . IPRATROPIUM BROMIDE HFA 17 MCG/ACT IN AERS Inhalation Inhale 2 puffs into the lungs 4 (four) times daily.    . LUBIPROSTONE 8 MCG PO CAPS Oral Take 8 mcg by mouth 2 (two) times daily as needed.     . ADULT MULTIVITAMIN W/MINERALS CH Oral Take 1 tablet by mouth daily.    . TRAMADOL HCL 50 MG PO TABS Oral Take 50 mg by mouth every 6 (six) hours as needed. For pain      BP 127/69  Pulse 63  Temp 98.1 F (36.7 C) (Oral)  Resp 18  SpO2 100%  Physical Exam  Nursing note and vitals reviewed. Constitutional: She is oriented to person, place, and time. She appears well-developed and well-nourished.  HENT:  Head: Normocephalic and atraumatic.  Eyes: EOM are normal. Pupils are equal, round, and reactive to light.  Neck: Normal range of motion. Neck supple.  Cardiovascular: Normal rate, normal heart sounds and intact distal pulses.  Pulmonary/Chest: Effort normal and breath sounds normal.  Abdominal: Bowel sounds are normal. She exhibits no distension. There is tenderness (RLQ moderate). There is no rebound.  Musculoskeletal: Normal range of motion. She exhibits no edema and no tenderness.  Neurological: She is alert and oriented to person, place, and time. She has normal strength. No cranial nerve deficit or sensory deficit.  Skin: Skin is warm and dry. No rash noted.  Psychiatric: She has a normal mood and affect.    ED Course  Procedures (including critical care time)  Labs Reviewed  CBC WITH DIFFERENTIAL - Abnormal; Notable for the following:    Hemoglobin 15.8 (*)     HCT 46.2 (*)     All other components within normal limits  COMPREHENSIVE METABOLIC PANEL - Abnormal; Notable for the following:    Sodium 134 (*)     All other components within  normal limits  URINALYSIS, ROUTINE W REFLEX MICROSCOPIC  PREGNANCY, URINE   Ct Abdomen Pelvis W Contrast  06/24/2012  *RADIOLOGY REPORT*  Clinical Data: Right lower quadrant abdominal pain.  Nausea.  CT ABDOMEN AND PELVIS WITH CONTRAST  Technique:  Multidetector CT imaging of the abdomen and pelvis was performed following the standard protocol during bolus administration of intravenous contrast.  Contrast: 80mL OMNIPAQUE IOHEXOL 300 MG/ML  SOLN  Comparison: 07/04/2011 and 02/06/2010  Findings: 8 mm low density lesion in the dome of the liver is unchanged in the interval.  Another small low density lesion in the lateral segment the left liver was not readily evident on the prior study as well seen on 02/06/2010 and is unchanged since that time. Tiny 5 mm posterior right liver lesion is stable.  Calcified granuloma again noted in the spleen.  The stomach, duodenum, pancreas, adrenal glands, and left kidney are unremarkable.  3 mm nonobstructing stone is seen in the right kidney.  No abdominal aortic aneurysm.  There is no free fluid or lymphadenopathy in the abdomen.  The abdominal bowel loops are unremarkable.  Imaging through the pelvis shows no free intraperitoneal fluid.  No pelvic sidewall lymphadenopathy.  Bladder is unremarkable.  Uterus is surgically absent.  There is no adnexal mass.  Diverticular change noted in the left colon without diverticulitis. The terminal ileum is normal.  The appendix is is opacified throughout its length except for the very tip.  There is no appendiceal dilatation.  No periappendiceal edema or inflammation.  The patient is status post thoracolumbar fusion for scoliosis. Bone windows reveal no worrisome lytic or sclerotic osseous lesions.  IMPRESSION: Stable exam.  No acute findings to explain the patient's history of abdominal pain.   Original Report Authenticated By: ERIC A. MANSELL, M.D.      No diagnosis found.    MDM  CT as above neg for acute problems, no  appendicitis. Pt feeling better and ready to go home. Asking for refill for her Amitiza for IBS.         Charles B. Bernette Mayers, MD 06/24/12 1610

## 2012-11-10 ENCOUNTER — Other Ambulatory Visit: Payer: Self-pay

## 2013-07-08 ENCOUNTER — Ambulatory Visit: Payer: Self-pay

## 2013-08-01 ENCOUNTER — Other Ambulatory Visit: Payer: Self-pay

## 2013-12-20 ENCOUNTER — Emergency Department (HOSPITAL_COMMUNITY): Payer: Medicare Other

## 2013-12-20 ENCOUNTER — Emergency Department (HOSPITAL_COMMUNITY)
Admission: EM | Admit: 2013-12-20 | Discharge: 2013-12-21 | Disposition: A | Discharge status: Home / Self Care | Payer: Medicare Other | Attending: Emergency Medicine | Admitting: Emergency Medicine

## 2013-12-20 ENCOUNTER — Encounter (HOSPITAL_COMMUNITY): Payer: Self-pay | Admitting: Emergency Medicine

## 2013-12-20 DIAGNOSIS — Z23 Encounter for immunization: Secondary | ICD-10-CM | POA: Insufficient documentation

## 2013-12-20 DIAGNOSIS — K589 Irritable bowel syndrome without diarrhea: Secondary | ICD-10-CM | POA: Insufficient documentation

## 2013-12-20 DIAGNOSIS — Y9301 Activity, walking, marching and hiking: Secondary | ICD-10-CM | POA: Insufficient documentation

## 2013-12-20 DIAGNOSIS — K219 Gastro-esophageal reflux disease without esophagitis: Secondary | ICD-10-CM | POA: Insufficient documentation

## 2013-12-20 DIAGNOSIS — J449 Chronic obstructive pulmonary disease, unspecified: Secondary | ICD-10-CM | POA: Insufficient documentation

## 2013-12-20 DIAGNOSIS — W268XXA Contact with other sharp object(s), not elsewhere classified, initial encounter: Secondary | ICD-10-CM | POA: Insufficient documentation

## 2013-12-20 DIAGNOSIS — I059 Rheumatic mitral valve disease, unspecified: Secondary | ICD-10-CM | POA: Insufficient documentation

## 2013-12-20 DIAGNOSIS — S81809A Unspecified open wound, unspecified lower leg, initial encounter: Principal | ICD-10-CM

## 2013-12-20 DIAGNOSIS — IMO0002 Reserved for concepts with insufficient information to code with codable children: Secondary | ICD-10-CM

## 2013-12-20 DIAGNOSIS — S81009A Unspecified open wound, unspecified knee, initial encounter: Secondary | ICD-10-CM | POA: Insufficient documentation

## 2013-12-20 DIAGNOSIS — S91009A Unspecified open wound, unspecified ankle, initial encounter: Principal | ICD-10-CM

## 2013-12-20 DIAGNOSIS — M25571 Pain in right ankle and joints of right foot: Secondary | ICD-10-CM

## 2013-12-20 DIAGNOSIS — I4891 Unspecified atrial fibrillation: Secondary | ICD-10-CM | POA: Insufficient documentation

## 2013-12-20 DIAGNOSIS — Y92009 Unspecified place in unspecified non-institutional (private) residence as the place of occurrence of the external cause: Secondary | ICD-10-CM | POA: Insufficient documentation

## 2013-12-20 DIAGNOSIS — Z87442 Personal history of urinary calculi: Secondary | ICD-10-CM | POA: Insufficient documentation

## 2013-12-20 DIAGNOSIS — Z79899 Other long term (current) drug therapy: Secondary | ICD-10-CM | POA: Insufficient documentation

## 2013-12-20 DIAGNOSIS — J4489 Other specified chronic obstructive pulmonary disease: Secondary | ICD-10-CM | POA: Insufficient documentation

## 2013-12-20 DIAGNOSIS — Z880 Allergy status to penicillin: Secondary | ICD-10-CM | POA: Insufficient documentation

## 2013-12-20 DIAGNOSIS — Z7982 Long term (current) use of aspirin: Secondary | ICD-10-CM | POA: Insufficient documentation

## 2013-12-20 DIAGNOSIS — Z8739 Personal history of other diseases of the musculoskeletal system and connective tissue: Secondary | ICD-10-CM | POA: Insufficient documentation

## 2013-12-20 HISTORY — DX: Gastro-esophageal reflux disease without esophagitis: K21.9

## 2013-12-20 HISTORY — DX: Diverticulosis of intestine, part unspecified, without perforation or abscess without bleeding: K57.90

## 2013-12-20 HISTORY — DX: Age-related osteoporosis without current pathological fracture: M81.0

## 2013-12-20 HISTORY — DX: Paroxysmal atrial fibrillation: I48.0

## 2013-12-20 MED ORDER — TETANUS-DIPHTH-ACELL PERTUSSIS 5-2.5-18.5 LF-MCG/0.5 IM SUSP
0.5000 mL | Freq: Once | INTRAMUSCULAR | Status: AC
  Administered 2013-12-20: 0.5 mL via INTRAMUSCULAR
  Filled 2013-12-20: qty 0.5

## 2013-12-20 NOTE — ED Provider Notes (Signed)
CSN: [id number]     Arrival date & time 12/20/13  2106 History   First MD Initiated Contact with Patient 12/20/13 2239     Chief Complaint  Patient presents with  . Extremity Laceration     (Consider location/radiation/quality/duration/timing/severity/associated sxs/prior Treatment) HPI Comments: Patient is a 58 year old female who presents to the emergency department for right ankle pain times one day. Patient states that she was walking over a great at her friend's house when she lost her footing causing the side of her ankle to scrape across it. Patient states that over the last 24 hour she has noticed an increased discomfort at the site of her skin tear as well as mild redness and swelling. Patient states that pain is worse with dorsiflexion of her right ankle and 1 weightbearing. She states that she was concerned for infection so she took one tablet doxycycline at 3:30 PM today. Patient denies associated red linear streaking, fever, numbness/tingling, and weakness. Unable to recall date of last tetanus.  The history is provided by the patient. No language interpreter was used.    Past Medical History  Diagnosis Date  . Asthma   . Fibromyalgia   . Arrhythmia   . Back pain   . Mitral valve prolapse   . Chronic obstructive airway disease   . Kidney stone   . Irritable bowel syndrome   . Diverticulosis   . GERD (gastroesophageal reflux disease)   . Paroxysmal a-fib   . Osteoporosis    Past Surgical History  Procedure Laterality Date  . Back surgery    . Abdominal hysterectomy    . Oophorectomy    . Cholecystectomy     History reviewed. No pertinent family history. History  Substance Use Topics  . Smoking status: Never Smoker   . Smokeless tobacco: Not on file  . Alcohol Use: No   OB History   Grav Para Term Preterm Abortions TAB SAB Ect Mult Living                 Review of Systems  Constitutional: Negative for fever.  Musculoskeletal: Positive for myalgias.    Skin: Positive for color change and wound. Negative for pallor.  Neurological: Negative for weakness and numbness.  All other systems reviewed and are negative.      Allergies  Vancomycin; Vicodin; Albuterol; Amoxicillin; Cephalosporins; Ciprofloxacin; Flagyl; Macrobid; Sulfa antibiotics; Decongest-aid; and Prednisone  Home Medications   Current Outpatient Rx  Name  Route  Sig  Dispense  Refill  . ALPRAZolam (XANAX) 0.5 MG tablet   Oral   Take 0.75 mg by mouth 4 (four) times daily.          Marland Kitchen. aspirin 81 MG chewable tablet   Oral   Chew 81 mg by mouth daily.         Marland Kitchen. atenolol (TENORMIN) 25 MG tablet   Oral   Take 12.5 mg by mouth 2 (two) times daily.          . budesonide (PULMICORT) 180 MCG/ACT inhaler   Inhalation   Inhale 1 puff into the lungs 2 (two) times daily.         Marland Kitchen. doxycycline (DORYX) 100 MG EC tablet   Oral   Take 100 mg by mouth 2 (two) times daily as needed. For rash on foot/pain         . fluticasone (FLONASE) 50 MCG/ACT nasal spray   Nasal   Place 2 sprays into the nose daily.         .Marland Kitchen  ipratropium (ATROVENT HFA) 17 MCG/ACT inhaler   Inhalation   Inhale 2 puffs into the lungs 4 (four) times daily.         Marland Kitchen lubiprostone (AMITIZA) 8 MCG capsule   Oral   Take 8 mcg by mouth 2 (two) times daily as needed for constipation.          . Multiple Vitamin (MULTIVITAMIN WITH MINERALS) TABS   Oral   Take 1 tablet by mouth daily.         . pantoprazole (PROTONIX) 40 MG tablet   Oral   Take 40 mg by mouth daily as needed. For stomach         . Vitamin D, Ergocalciferol, (DRISDOL) 50000 UNITS CAPS capsule   Oral   Take 50,000 Units by mouth every 7 (seven) days. Has no certain days. Usually take it on weekends         . doxycycline (VIBRAMYCIN) 100 MG capsule   Oral   Take 1 capsule (100 mg total) by mouth 2 (two) times daily. Start on evening of 12/22/13 if symptoms worsen.   14 capsule   0   . traMADol (ULTRAM) 50 MG  tablet   Oral   Take 1 tablet (50 mg total) by mouth every 6 (six) hours as needed. For pain   11 tablet   0    BP 142/79  Pulse 79  Temp(Src) 98 F (36.7 C) (Oral)  Resp 18  SpO2 98%  Physical Exam  Nursing note and vitals reviewed. Constitutional: She is oriented to person, place, and time. She appears well-developed and well-nourished. No distress.  HENT:  Head: Normocephalic and atraumatic.  Eyes: Conjunctivae and EOM are normal. No scleral icterus.  Neck: Normal range of motion.  Cardiovascular: Normal rate, regular rhythm and intact distal pulses.   Capillary refill normal in all digits of R foot.  Pulmonary/Chest: Effort normal. No respiratory distress.  Musculoskeletal: Normal range of motion.       Right ankle: She exhibits swelling (very mild to posterior aspect of medial malleolus). She exhibits normal range of motion, no deformity and normal pulse. Tenderness. Medial malleolus tenderness found. No lateral malleolus tenderness found. Achilles tendon normal.       Feet:  Neurological: She is alert and oriented to person, place, and time. She has normal reflexes. She displays normal reflexes. She exhibits normal muscle tone.  No gross sensory deficits appreciated. Patellar and achilles reflexes 2+ in RLE.  Skin: Skin is warm and dry. No rash noted. She is not diaphoretic. No erythema. No pallor.  Psychiatric: She has a normal mood and affect. Her behavior is normal.    ED Course  Procedures (including critical care time)  Labs Review Labs Reviewed - No data to display  Imaging Review Dg Ankle Complete Right  12/21/2013   CLINICAL DATA:  Laceration, medial aspect of right Ankle  EXAM: RIGHT ANKLE - COMPLETE 3+ VIEW  COMPARISON:  None available  FINDINGS: No acute fracture or dislocation. The ankle mortise is approximated. No joint effusion. No definite soft tissue abnormality identified. No radiopaque foreign body.  Osseous mineralization is normal.  IMPRESSION: 1.  No acute fracture or dislocation. 2. No radiopaque foreign body.   Electronically Signed   By: Rise Mu M.D.   On: 12/21/2013 00:19     EKG Interpretation None      MDM   Final diagnoses:  Skin tear  Right ankle pain    58 year old female presents for a  skin tear to her medial right ankle with onset 2 days ago. Patient is neurovascularly intact with normal sensation to light touch. Capillary refill normal in all digits of right foot. No evidence of cellulitis; no marked erythema, red linear streaking, or heat to touch. No purulent drainage. Tetanus updated in ED. X-ray obtained today which shows no evidence of subcutaneous gas or osteomyelitis. Patient is stable and appropriate for discharge with instruction to followup with her primary doctor on Monday. Tramadol excised for pain control as well as application of bacitracin twice a day. Patient concerned about spread of infection. Will prescribe doxycycline to begin on evening of 12/22/2013 should symptoms worsen. Return precautions provided and patient agreeable to plan with no unaddressed concerns.   Filed Vitals:   12/20/13 2115 12/21/13 0032  BP: 142/79 107/64  Pulse: 79 73  Temp: 98 F (36.7 C) 98 F (36.7 C)  TempSrc: Oral Oral  Resp: 18 18  SpO2: 98% 96%       Antony Madura, PA-C 12/21/13 [phone]

## 2013-12-20 NOTE — ED Notes (Addendum)
Patient arrives from home tonight with complaint of right foot laceration. Foot was cut by metal gate as she was stepping over. Has been taking Doxycycline at home from a previous prescription without improvement. Today pain was drastically increased and redness noted by patient to have increased over a period of 2 days. Explains that flexion of her ankle is extremely painful.

## 2013-12-20 NOTE — ED Notes (Signed)
MD at bedside. 

## 2013-12-21 MED ORDER — DOXYCYCLINE HYCLATE 100 MG PO CAPS: 100.0000 mg | ORAL_CAPSULE | Freq: Two times a day (BID) | ORAL | Status: DC

## 2013-12-21 MED ORDER — TRAMADOL HCL 50 MG PO TABS: 50.0000 mg | ORAL_TABLET | Freq: Four times a day (QID) | ORAL | Status: DC | PRN

## 2013-12-21 NOTE — Discharge Instructions (Signed)
Your xray today was negative for subcutaneous gas and bone infection. Recommend bacitracin topically twice a day. Keep area covered and change dressing at least once per day. Take doxycycline as prescribed if symptoms worsen by evening of 12/22/13. Follow up with your doctor on Monday.  Skin Tear Care A skin tear is a wound in which the top layer of skin has peeled off. This is a common problem with aging because the skin becomes thinner and more fragile as a person gets older. In addition, some medicines, such as oral corticosteroids, can lead to skin thinning if taken for long periods of time.  A skin tear is often repaired with tape or skin adhesive strips. This keeps the skin that has been peeled off in contact with the healthier skin beneath. Depending on the location of the wound, a bandage (dressing) may be applied over the tape or skin adhesive strips. Sometimes, during the healing process, the skin turns black and dies. Even when this happens, the torn skin acts as a good dressing until the skin underneath gets healthier and repairs itself. HOME CARE INSTRUCTIONS   Change dressings once per day or as directed by your caregiver.  Gently clean the skin tear and the area around the tear using saline solution or mild soap and water.  Do not rub the injured skin dry. Let the area air dry.  Apply petroleum jelly or an antibiotic cream or ointment to keep the tear moist. This will help the wound heal. Do not allow a scab to form.  If the dressing sticks before the next dressing change, moisten it with warm soapy water and gently remove it.  Protect the injured skin until it has healed.  Only take over-the-counter or prescription medicines as directed by your caregiver.  Take showers or baths using warm soapy water. Apply a new dressing after the shower or bath.  Keep all follow-up appointments as directed by your caregiver.  SEEK IMMEDIATE MEDICAL CARE IF:   You have redness, swelling, or  increasing pain in the skin tear.  You havepus coming from the skin tear.  You have chills.  You have a red streak that goes away from the skin tear.  You have a bad smell coming from the tear or dressing.  You have a fever or persistent symptoms for more than 2 3 days.  You have a fever and your symptoms suddenly get worse. MAKE SURE YOU:  Understand these instructions.  Will watch this condition.  Will get help right away if your child is not doing well or gets worse. Document Released: 06/07/2001 Document Revised: 06/06/2012 Document Reviewed: 03/26/2012 [facility] Patient Information 2014 [place], [place].

## 2013-12-21 NOTE — ED Provider Notes (Signed)
Medical screening examination/treatment/procedure(s) were conducted as a shared visit with non-physician practitioner(s) or resident and myself. I personally evaluated the patient during the encounter and agree with the findings and plan unless otherwise indicated.  I have personally reviewed any xrays and/ or EKG's with the provider and I agree with interpretation.  Patient with fibromyalgia, nonspecific autoimmune disease, GERD, osteoporosis present with right medial lower leg pain and skin tears since yesterday. No fevers chills or vomiting. Patient noticed it was more red around the border today. No discharge or streaking. On exam 2 cm superficial skin tear, oval appearance, no streaking warmth or crepitus. Neurovascular intact in the right distal extremity full range of motion of the right ankle and foot with no focal bone tenderness. Discussed watch and wait with doxycycline as currently no signs of active cellulitis.     Skin wound  Sandra SkeensJoshua M Ji Feldner, MD 12/21/13 0800

## 2014-11-04 ENCOUNTER — Emergency Department (HOSPITAL_COMMUNITY)
Admission: EM | Admit: 2014-11-04 | Discharge: 2014-11-04 | Disposition: A | Discharge status: Home / Self Care | Payer: Medicare Other | Attending: Emergency Medicine | Admitting: Emergency Medicine

## 2014-11-04 ENCOUNTER — Emergency Department (HOSPITAL_COMMUNITY): Payer: Medicare Other

## 2014-11-04 ENCOUNTER — Encounter (HOSPITAL_COMMUNITY): Payer: Self-pay | Admitting: Emergency Medicine

## 2014-11-04 DIAGNOSIS — J449 Chronic obstructive pulmonary disease, unspecified: Secondary | ICD-10-CM | POA: Insufficient documentation

## 2014-11-04 DIAGNOSIS — M797 Fibromyalgia: Secondary | ICD-10-CM | POA: Diagnosis not present

## 2014-11-04 DIAGNOSIS — I480 Paroxysmal atrial fibrillation: Secondary | ICD-10-CM | POA: Insufficient documentation

## 2014-11-04 DIAGNOSIS — K589 Irritable bowel syndrome without diarrhea: Secondary | ICD-10-CM | POA: Diagnosis not present

## 2014-11-04 DIAGNOSIS — Z87442 Personal history of urinary calculi: Secondary | ICD-10-CM | POA: Insufficient documentation

## 2014-11-04 DIAGNOSIS — Z9049 Acquired absence of other specified parts of digestive tract: Secondary | ICD-10-CM | POA: Diagnosis not present

## 2014-11-04 DIAGNOSIS — Z7982 Long term (current) use of aspirin: Secondary | ICD-10-CM | POA: Diagnosis not present

## 2014-11-04 DIAGNOSIS — Z792 Long term (current) use of antibiotics: Secondary | ICD-10-CM | POA: Diagnosis not present

## 2014-11-04 DIAGNOSIS — Z79899 Other long term (current) drug therapy: Secondary | ICD-10-CM | POA: Diagnosis not present

## 2014-11-04 DIAGNOSIS — K219 Gastro-esophageal reflux disease without esophagitis: Secondary | ICD-10-CM | POA: Diagnosis not present

## 2014-11-04 DIAGNOSIS — Z7951 Long term (current) use of inhaled steroids: Secondary | ICD-10-CM | POA: Insufficient documentation

## 2014-11-04 DIAGNOSIS — M810 Age-related osteoporosis without current pathological fracture: Secondary | ICD-10-CM | POA: Insufficient documentation

## 2014-11-04 DIAGNOSIS — Z880 Allergy status to penicillin: Secondary | ICD-10-CM | POA: Insufficient documentation

## 2014-11-04 DIAGNOSIS — R1031 Right lower quadrant pain: Secondary | ICD-10-CM | POA: Diagnosis present

## 2014-11-04 DIAGNOSIS — R109 Unspecified abdominal pain: Secondary | ICD-10-CM

## 2014-11-04 DIAGNOSIS — Z90710 Acquired absence of both cervix and uterus: Secondary | ICD-10-CM | POA: Insufficient documentation

## 2014-11-04 LAB — CBC WITH DIFFERENTIAL/PLATELET
Basophils Absolute: 0 10*3/uL (ref 0.0–0.1)
Basophils Relative: 0 % (ref 0–1)
Eosinophils Absolute: 0.1 10*3/uL (ref 0.0–0.7)
Eosinophils Relative: 2 % (ref 0–5)
HCT: 45.6 % (ref 36.0–46.0)
Hemoglobin: 15.4 g/dL — ABNORMAL HIGH (ref 12.0–15.0)
Lymphocytes Relative: 41 % (ref 12–46)
Lymphs Abs: 2.9 10*3/uL (ref 0.7–4.0)
MCH: 31 pg (ref 26.0–34.0)
MCHC: 33.8 g/dL (ref 30.0–36.0)
MCV: 91.8 fL (ref 78.0–100.0)
Monocytes Absolute: 0.6 10*3/uL (ref 0.1–1.0)
Monocytes Relative: 9 % (ref 3–12)
Neutro Abs: 3.4 10*3/uL (ref 1.7–7.7)
Neutrophils Relative %: 48 % (ref 43–77)
Platelets: 226 10*3/uL (ref 150–400)
RBC: 4.97 MIL/uL (ref 3.87–5.11)
RDW: 13.2 % (ref 11.5–15.5)
WBC: 7.1 10*3/uL (ref 4.0–10.5)

## 2014-11-04 LAB — COMPREHENSIVE METABOLIC PANEL
ALT: 14 U/L (ref 0–35)
AST: 24 U/L (ref 0–37)
Albumin: 3.6 g/dL (ref 3.5–5.2)
Alkaline Phosphatase: 59 U/L (ref 39–117)
Anion gap: 9 (ref 5–15)
BUN: <5 mg/dL — ABNORMAL LOW (ref 6–23)
CO2: 25 mmol/L (ref 19–32)
Calcium: 8.9 mg/dL (ref 8.4–10.5)
Chloride: 103 mmol/L (ref 96–112)
Creatinine, Ser: 0.64 mg/dL (ref 0.50–1.10)
GFR calc Af Amer: >90 mL/min (ref >90)
GFR calc non Af Amer: >90 mL/min (ref >90)
Glucose, Bld: 78 mg/dL (ref 70–99)
Potassium: 3.3 mmol/L — ABNORMAL LOW (ref 3.5–5.1)
Sodium: 137 mmol/L (ref 135–145)
Total Bilirubin: 1 mg/dL (ref 0.3–1.2)
Total Protein: 6.8 g/dL (ref 6.0–8.3)

## 2014-11-04 LAB — URINALYSIS, ROUTINE W REFLEX MICROSCOPIC
Bilirubin Urine: NEGATIVE
Glucose, UA: NEGATIVE mg/dL
Hgb urine dipstick: NEGATIVE
Ketones, ur: NEGATIVE mg/dL
Leukocytes, UA: NEGATIVE
Nitrite: NEGATIVE
Protein, ur: NEGATIVE mg/dL
Specific Gravity, Urine: 1.003 — ABNORMAL LOW (ref 1.005–1.030)
Urobilinogen, UA: 0.2 mg/dL (ref 0.0–1.0)
pH: 6.5 (ref 5.0–8.0)

## 2014-11-04 LAB — LIPASE, BLOOD: Lipase: 35 U/L (ref 11–59)

## 2014-11-04 MED ORDER — DICYCLOMINE HCL 20 MG PO TABS: 20.0000 mg | ORAL_TABLET | Freq: Two times a day (BID) | ORAL | Status: DC

## 2014-11-04 NOTE — Discharge Instructions (Signed)
Abdominal Pain Many things can cause abdominal pain. Usually, abdominal pain is not caused by a disease and will improve without treatment. It can often be observed and treated at home. Your health care provider will do a physical exam and possibly order blood tests and X-rays to help determine the seriousness of your pain. However, in many cases, more time must pass before a clear cause of the pain can be found. Before that point, your health care provider may not know if you need more testing or further treatment. HOME CARE INSTRUCTIONS  Monitor your abdominal pain for any changes. The following actions may help to alleviate any discomfort you are experiencing:  Only take over-the-counter or prescription medicines as directed by your health care provider.  Do not take laxatives unless directed to do so by your health care provider.  Try a clear liquid diet (broth, tea, or water) as directed by your health care provider. Slowly move to a bland diet as tolerated. SEEK MEDICAL CARE IF:  You have unexplained abdominal pain.  You have abdominal pain associated with nausea or diarrhea.  You have pain when you urinate or have a bowel movement.  You experience abdominal pain that wakes you in the night.  You have abdominal pain that is worsened or improved by eating food.  You have abdominal pain that is worsened with eating fatty foods.  You have a fever. SEEK IMMEDIATE MEDICAL CARE IF:   Your pain does not go away within 2 hours.  You keep throwing up (vomiting).  Your pain is felt only in portions of the abdomen, such as the right side or the left lower portion of the abdomen.  You pass bloody or black tarry stools. MAKE SURE YOU:  Understand these instructions.   Will watch your condition.   Will get help right away if you are not doing well or get worse.  Document Released: 06/22/2005 Document Revised: 09/17/2013 Document Reviewed: 05/22/2013 Exit[employer] Patient Information  2015 [employer]. This information is not intended to replace advice given to you by your health care provider. Make sure you discuss any questions you have with your health care provider.   Irritable Bowel Syndrome Irritable bowel syndrome (IBS) is caused by a disturbance of normal bowel function and is a common digestive disorder. You may also hear this condition called spastic colon, mucous colitis, and irritable colon. There is no cure for IBS. However, symptoms often gradually improve or disappear with a good diet, stress management, and medicine. This condition usually appears in late adolescence or early adulthood. Women develop it twice as often as men. CAUSES  After food has been digested and absorbed in the small intestine, waste material is moved into the large intestine, or colon. In the colon, water and salts are absorbed from the undigested products coming from the small intestine. The remaining residue, or fecal material, is held for elimination. Under normal circumstances, gentle, rhythmic contractions of the bowel walls push the fecal material along the colon toward the rectum. In IBS, however, these contractions are irregular and poorly coordinated. The fecal material is either retained too long, resulting in constipation, or expelled too soon, producing diarrhea. SIGNS AND SYMPTOMS  The most common symptom of IBS is abdominal pain. It is often in the lower left side of the abdomen, but it may occur anywhere in the abdomen. The pain comes from spasms of the bowel muscles happening too much and from the buildup of gas and fecal material in the colon.   This pain:  Can range from sharp abdominal cramps to a dull, continuous ache.  Often worsens soon after eating.  Is often relieved by having a bowel movement or passing gas. Abdominal pain is usually accompanied by constipation, but it may also produce diarrhea. The diarrhea often occurs right after a meal or upon waking up in the  morning. The stools are often soft, watery, and flecked with mucus. Other symptoms of IBS include:  Bloating.  Loss of appetite.  Heartburn.  Backache.  Dull pain in the arms or shoulders.  Nausea.  Burping.  Vomiting.  Gas. IBS may also cause symptoms that are unrelated to the digestive system, such as:  Fatigue.  Headaches.  Anxiety.  Shortness of breath.  Trouble concentrating.  Dizziness. These symptoms tend to come and go. DIAGNOSIS  The symptoms of IBS may seem like symptoms of other, more serious digestive disorders. Your health care provider may want to perform tests to exclude these disorders.  TREATMENT Many medicines are available to help correct bowel function or relieve bowel spasms and abdominal pain. Among the medicines available are:  Laxatives for severe constipation and to help restore normal bowel habits.  Specific antidiarrheal medicines to treat severe or lasting diarrhea.  Antispasmodic agents to relieve intestinal cramps. Your health care provider may also decide to treat you with a mild tranquilizer or sedative during unusually stressful periods in your life. Your health care provider may also prescribe antidepressant medicine. The use of this medicine has been shown to reduce pain and other symptoms of IBS. Remember that if any medicine is prescribed for you, you should take it exactly as directed. Make sure your health care provider knows how well it worked for you. HOME CARE INSTRUCTIONS   Take all medicines as directed by your health care provider.  Avoid foods that are high in fat or oils, such as heavy cream, butter, frankfurters, sausage, and other fatty meats.  Avoid foods that make you go to the bathroom, such as fruit, fruit juice, and dairy products.  Cut out carbonated drinks, chewing gum, and "gassy" foods such as beans and cabbage. This may help relieve bloating and burping.  Eat foods with bran, and drink plenty of liquids  with the bran foods. This helps relieve constipation.  Keep track of what foods seem to bring on your symptoms.  Avoid emotionally charged situations or circumstances that produce anxiety.  Start or continue exercising.  Get plenty of rest and sleep. Document Released: 09/12/2005 Document Revised: 09/17/2013 Document Reviewed: 05/02/2008 Exit[employer] Patient Information 2015 [employer]. This information is not intended to replace advice given to you by your health care provider. Make sure you discuss any questions you have with your health care provider.  

## 2014-11-04 NOTE — ED Notes (Signed)
Pt requesting no opiod meds--

## 2014-11-04 NOTE — ED Notes (Signed)
Pt reports RLQ abdominal pain x 5 days with radiation to back. Reports nausea, but denies emesis or diarrhea. 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 Denies hematuria. Reports low grade fever. Pt in NAD.

## 2014-11-04 NOTE — ED Provider Notes (Addendum)
CSN: [id number]     Arrival date & time 11/04/14  [id number] History   First MD Initiated Contact with Patient 11/04/14 0840     Chief Complaint  Patient presents with  . Abdominal Pain      HPI  She presents for evaluation of abdominal pain. Primary right lower quadrant now. Present for the last 5 days. States was started on Thursday or Friday it felt "slightly IBS". She has had bowel movements over the weekend. She took a dose of doxycycline Friday and Saturday "that I had at home from before". States that she felt better Saturday and part of Sunday. Today is Tuesday. Sunday night yesterday, Monday she states it became worse and she became worried "because I still have my appendix, and I know I have sigmoid diverticuli". States she's had a temperature of 99 at home. Minimal nausea but no vomiting.  Past Medical History  Diagnosis Date  . Asthma   . Fibromyalgia   . Arrhythmia   . Back pain   . Mitral valve prolapse   . Chronic obstructive airway disease   . Kidney stone   . Irritable bowel syndrome   . Diverticulosis   . GERD (gastroesophageal reflux disease)   . Paroxysmal a-fib   . Osteoporosis    Past Surgical History  Procedure Laterality Date  . Back surgery    . Abdominal hysterectomy    . Oophorectomy    . Cholecystectomy     History reviewed. No pertinent family history. History  Substance Use Topics  . Smoking status: Never Smoker   . Smokeless tobacco: Not on file  . Alcohol Use: No   OB History    No data available     Review of Systems  Constitutional: Negative for fever, chills, diaphoresis, appetite change and fatigue.  HENT: Negative for mouth sores, sore throat and trouble swallowing.   Eyes: Negative for visual disturbance.  Respiratory: Negative for cough, chest tightness, shortness of breath and wheezing.   Cardiovascular: Negative for chest pain.  Gastrointestinal: Positive for nausea and abdominal pain. Negative for vomiting, diarrhea and abdominal  distention.  Endocrine: Negative for polydipsia, polyphagia and polyuria.  Genitourinary: Negative for dysuria, urgency, frequency, hematuria, flank pain and difficulty urinating.  Musculoskeletal: Negative for gait problem.  Skin: Negative for color change, pallor and rash.  Neurological: Negative for dizziness, syncope, light-headedness and headaches.  Hematological: Does not bruise/bleed easily.  Psychiatric/Behavioral: Negative for behavioral problems and confusion.      Allergies  Vancomycin; Vicodin; Albuterol; Amoxicillin; Cephalosporins; Ciprofloxacin; Flagyl; Macrobid; Sulfa antibiotics; Decongest-aid; and Prednisone  Home Medications   Prior to Admission medications   Medication Sig Start Date End Date Taking? Authorizing Provider  ALPRAZolam Prudy Feeler(XANAX) 0.5 MG tablet Take 0.75 mg by mouth 4 (four) times daily.     Historical Provider, MD  aspirin 81 MG chewable tablet Chew 81 mg by mouth daily.    Historical Provider, MD  atenolol (TENORMIN) 25 MG tablet Take 12.5 mg by mouth 2 (two) times daily.     Historical Provider, MD  budesonide (PULMICORT) 180 MCG/ACT inhaler Inhale 1 puff into the lungs 2 (two) times daily.    Historical Provider, MD  dicyclomine (BENTYL) 20 MG tablet Take 1 tablet (20 mg total) by mouth 2 (two) times daily. 11/04/14   Rolland PorterMark Reniya Mcclees, MD  doxycycline (DORYX) 100 MG EC tablet Take 100 mg by mouth 2 (two) times daily as needed. For rash on foot/pain    Historical Provider, MD  doxycycline (  VIBRAMYCIN) 100 MG capsule Take 1 capsule (100 mg total) by mouth 2 (two) times daily. Start on evening of 12/22/13 if symptoms worsen. 12/22/13   Antony Madura, PA-C  fluticasone (FLONASE) 50 MCG/ACT nasal spray Place 2 sprays into the nose daily.    Historical Provider, MD  ipratropium (ATROVENT HFA) 17 MCG/ACT inhaler Inhale 2 puffs into the lungs 4 (four) times daily.    Historical Provider, MD  lubiprostone (AMITIZA) 8 MCG capsule Take 8 mcg by mouth 2 (two) times daily as  needed for constipation.     Historical Provider, MD  Multiple Vitamin (MULTIVITAMIN WITH MINERALS) TABS Take 1 tablet by mouth daily.    Historical Provider, MD  pantoprazole (PROTONIX) 40 MG tablet Take 40 mg by mouth daily as needed. For stomach    Historical Provider, MD  traMADol (ULTRAM) 50 MG tablet Take 1 tablet (50 mg total) by mouth every 6 (six) hours as needed. For pain 12/21/13   Antony Madura, PA-C  Vitamin D, Ergocalciferol, (DRISDOL) 50000 UNITS CAPS capsule Take 50,000 Units by mouth every 7 (seven) days. Has no certain days. Usually take it on weekends    Historical Provider, MD   BP 88/51 mmHg  Pulse 58  Temp(Src) 97.4 F (36.3 C) (Oral)  Resp 17  SpO2 94% Physical Exam  Constitutional: She is oriented to person, place, and time. She appears well-developed and well-nourished. No distress.  HENT:  Head: Normocephalic.  Eyes: Conjunctivae are normal. Pupils are equal, round, and reactive to light. No scleral icterus.  Neck: Normal range of motion. Neck supple. No thyromegaly present.  Cardiovascular: Normal rate and regular rhythm.  Exam reveals no gallop and no friction rub.   No murmur heard. Pulmonary/Chest: Effort normal and breath sounds normal. No respiratory distress. She has no wheezes. She has no rales.  Abdominal: Soft. Bowel sounds are normal. She exhibits no distension. There is no tenderness. There is no rebound.    Musculoskeletal: Normal range of motion.  Neurological: She is alert and oriented to person, place, and time.  Skin: Skin is warm and dry. No rash noted.  Psychiatric: She has a normal mood and affect. Her behavior is normal.    ED Course  Procedures (including critical care time) Labs Review Labs Reviewed  CBC WITH DIFFERENTIAL/PLATELET - Abnormal; Notable for the following:    Hemoglobin 15.4 (*)    All other components within normal limits  COMPREHENSIVE METABOLIC PANEL - Abnormal; Notable for the following:    Potassium 3.3 (*)    BUN  <5 (*)    All other components within normal limits  URINALYSIS, ROUTINE W REFLEX MICROSCOPIC - Abnormal; Notable for the following:    Specific Gravity, Urine 1.003 (*)    All other components within normal limits  LIPASE, BLOOD    Imaging Review Ct Abdomen Pelvis Wo Contrast  11/04/2014   CLINICAL DATA:  Right flank pain and right lower quadrant pain.  EXAM: CT ABDOMEN AND PELVIS WITHOUT CONTRAST  TECHNIQUE: Multidetector CT imaging of the abdomen and pelvis was performed following the standard protocol without IV contrast.  COMPARISON:  CT scan dated 06/24/2012 and 02/05/2008  FINDINGS: There is a stable 3 mm nodule in the lateral aspect of the right middle lobe, unchanged since 2009. There is also stable 6 mm nodule in the lingula with slight scarring in the lingula.  Heart size is normal. Previous spine fusion from T9 to L4 with Harrington rod in place. Lumbar scoliosis.  There is a  2.8 mm stone in the mid right kidney. There is no hydronephrosis. No mass. No ureteral or bladder calculi.  Gallbladder is been removed. Liver, biliary tree, adrenal glands, and left kidney are normal. There is a 4 mm benign calcification in the otherwise normal spleen. There scattered diverticula in the otherwise normal colon. Terminal ileum and appendix and small bowel appear normal. No adenopathy. Scattered calcifications in the abdominal aorta and iliac arteries. No free air or free fluid. Uterus has been removed. Ovaries are normal.  IMPRESSION: No acute abnormalities. Small stone in the right kidney. Diverticulosis of the distal colon.   Electronically Signed   By: Francene Boyers M.D.   On: 11/04/2014 11:25     EKG Interpretation None      MDM   Final diagnoses:  Abdominal pain  Irritable bowel    CT scan is reassuring. She stones, however no sign of acute hydronephrosis or passage of stone. Visualized appendix without periappendiceal inflammation. Diverticuli, without signs of diverticulitis. With  normal white blood cell count, reassuring CT, and normal urine think she is appropriate for outpatient treatment for her irritable bowel syndrome exacerbation. We discussed treatment. She has done well in the past with Bentyl. Given a prescription. Discharged home. ER with any acute changes. Routine primary care follow-up.    Rolland Porter, MD 11/04/14 1255  Rolland Porter, MD 11/04/14 1257

## 2014-11-04 NOTE — ED Notes (Signed)
C/o hx diverticulitis, abd pain, started taking doxycycline this weekend. States has not eaten since midnight, fever in evenings, "99 - axillary" dr Fayrene Fearingjames at bedside.

## 2014-12-15 ENCOUNTER — Emergency Department (HOSPITAL_COMMUNITY): Payer: Medicare Other

## 2014-12-15 ENCOUNTER — Emergency Department (HOSPITAL_COMMUNITY)
Admission: EM | Admit: 2014-12-15 | Discharge: 2014-12-15 | Disposition: A | Discharge status: Home / Self Care | Payer: Medicare Other | Attending: Emergency Medicine | Admitting: Emergency Medicine

## 2014-12-15 ENCOUNTER — Encounter (HOSPITAL_COMMUNITY): Payer: Self-pay | Admitting: Emergency Medicine

## 2014-12-15 DIAGNOSIS — Z79899 Other long term (current) drug therapy: Secondary | ICD-10-CM | POA: Insufficient documentation

## 2014-12-15 DIAGNOSIS — G43909 Migraine, unspecified, not intractable, without status migrainosus: Secondary | ICD-10-CM

## 2014-12-15 DIAGNOSIS — Z792 Long term (current) use of antibiotics: Secondary | ICD-10-CM | POA: Diagnosis not present

## 2014-12-15 DIAGNOSIS — Z880 Allergy status to penicillin: Secondary | ICD-10-CM | POA: Diagnosis not present

## 2014-12-15 DIAGNOSIS — Z7951 Long term (current) use of inhaled steroids: Secondary | ICD-10-CM | POA: Diagnosis not present

## 2014-12-15 DIAGNOSIS — Z7982 Long term (current) use of aspirin: Secondary | ICD-10-CM | POA: Diagnosis not present

## 2014-12-15 DIAGNOSIS — R4701 Aphasia: Secondary | ICD-10-CM

## 2014-12-15 DIAGNOSIS — K219 Gastro-esophageal reflux disease without esophagitis: Secondary | ICD-10-CM | POA: Insufficient documentation

## 2014-12-15 DIAGNOSIS — Z87442 Personal history of urinary calculi: Secondary | ICD-10-CM | POA: Diagnosis not present

## 2014-12-15 DIAGNOSIS — M810 Age-related osteoporosis without current pathological fracture: Secondary | ICD-10-CM | POA: Diagnosis not present

## 2014-12-15 DIAGNOSIS — J449 Chronic obstructive pulmonary disease, unspecified: Secondary | ICD-10-CM | POA: Diagnosis not present

## 2014-12-15 DIAGNOSIS — R001 Bradycardia, unspecified: Secondary | ICD-10-CM | POA: Diagnosis not present

## 2014-12-15 LAB — I-STAT CHEM 8, ED
BUN: 5 mg/dL — ABNORMAL LOW (ref 6–23)
Calcium, Ion: 1.16 mmol/L (ref 1.12–1.23)
Chloride: 96 mmol/L (ref 96–112)
Creatinine, Ser: 0.7 mg/dL (ref 0.50–1.10)
Glucose, Bld: 99 mg/dL (ref 70–99)
HCT: 50 % — ABNORMAL HIGH (ref 36.0–46.0)
Hemoglobin: 17 g/dL — ABNORMAL HIGH (ref 12.0–15.0)
Potassium: 3.1 mmol/L — ABNORMAL LOW (ref 3.5–5.1)
Sodium: 137 mmol/L (ref 135–145)
TCO2: 25 mmol/L (ref 0–100)

## 2014-12-15 LAB — URINALYSIS, ROUTINE W REFLEX MICROSCOPIC
Bilirubin Urine: NEGATIVE
Glucose, UA: NEGATIVE mg/dL
Hgb urine dipstick: NEGATIVE
Ketones, ur: NEGATIVE mg/dL
Leukocytes, UA: NEGATIVE
Nitrite: NEGATIVE
Protein, ur: NEGATIVE mg/dL
Specific Gravity, Urine: 1.003 — ABNORMAL LOW (ref 1.005–1.030)
Urobilinogen, UA: 0.2 mg/dL (ref 0.0–1.0)
pH: 6 (ref 5.0–8.0)

## 2014-12-15 LAB — CBC WITH DIFFERENTIAL/PLATELET
Basophils Absolute: 0 10*3/uL (ref 0.0–0.1)
Basophils Relative: 0 % (ref 0–1)
Eosinophils Absolute: 0.2 10*3/uL (ref 0.0–0.7)
Eosinophils Relative: 2 % (ref 0–5)
HCT: 44.9 % (ref 36.0–46.0)
Hemoglobin: 14.9 g/dL (ref 12.0–15.0)
Lymphocytes Relative: 39 % (ref 12–46)
Lymphs Abs: 3.6 10*3/uL (ref 0.7–4.0)
MCH: 30.7 pg (ref 26.0–34.0)
MCHC: 33.2 g/dL (ref 30.0–36.0)
MCV: 92.4 fL (ref 78.0–100.0)
Monocytes Absolute: 0.7 10*3/uL (ref 0.1–1.0)
Monocytes Relative: 7 % (ref 3–12)
Neutro Abs: 4.7 10*3/uL (ref 1.7–7.7)
Neutrophils Relative %: 52 % (ref 43–77)
Platelets: 247 10*3/uL (ref 150–400)
RBC: 4.86 MIL/uL (ref 3.87–5.11)
RDW: 12.6 % (ref 11.5–15.5)
WBC: 9.2 10*3/uL (ref 4.0–10.5)

## 2014-12-15 LAB — I-STAT TROPONIN, ED: Troponin i, poc: 0 ng/mL (ref 0.00–0.08)

## 2014-12-15 LAB — CBG MONITORING, ED: Glucose-Capillary: 101 mg/dL — ABNORMAL HIGH (ref 70–99)

## 2014-12-15 LAB — RAPID URINE DRUG SCREEN, HOSP PERFORMED
Amphetamines: NOT DETECTED
Barbiturates: NOT DETECTED
Benzodiazepines: POSITIVE — AB
Cocaine: NOT DETECTED
Opiates: NOT DETECTED
Tetrahydrocannabinol: NOT DETECTED

## 2014-12-15 LAB — CK TOTAL AND CKMB (NOT AT ARMC)
CK, MB: 0.9 ng/mL (ref 0.3–4.0)
Relative Index: INVALID (ref 0.0–2.5)
Total CK: 37 U/L (ref 7–177)

## 2014-12-15 LAB — APTT: aPTT: 35 seconds (ref 24–37)

## 2014-12-15 LAB — ETHANOL: Alcohol, Ethyl (B): <5 mg/dL (ref 0–9)

## 2014-12-15 MED ORDER — POTASSIUM CHLORIDE CRYS ER 20 MEQ PO TBCR
40.0000 meq | EXTENDED_RELEASE_TABLET | Freq: Once | ORAL | Status: AC
  Administered 2014-12-15: 40 meq via ORAL
  Filled 2014-12-15: qty 2

## 2014-12-15 NOTE — ED Notes (Signed)
Code Stroke paged

## 2014-12-15 NOTE — Discharge Instructions (Signed)
Recurrent Migraine Headache Sandra Burns, your MRI did not show a stroke. Her symptoms are likely due to your migraines. Follow-up with your neurologist within 3 days for continued management. If symptoms worsen come back to the emergency department immediately. Thank you A migraine headache is very bad, throbbing pain on one or both sides of your head. Recurrent migraines keep coming back. Talk to your doctor about what things may bring on (trigger) your migraine headaches. HOME CARE  Only take medicines as told by your doctor.  Lie down in a dark, quiet room when you have a migraine.  Keep a journal to find out if certain things bring on migraine headaches. For example, write down:  What you eat and drink.  How much sleep you get.  Any change to your diet or medicines.  Lessen how much alcohol you drink.  Quit smoking if you smoke.  Get enough sleep.  Lessen any stress in your life.  Keep lights dim if bright lights bother you or make your migraines worse. GET HELP IF:  Medicine does not help your migraines.  Your pain keeps coming back.  You have a fever. GET HELP RIGHT AWAY IF:   Your migraine becomes really bad.  You have a stiff neck.  You have trouble seeing.  Your muscles are weak, or you lose muscle control.  You lose your balance or have trouble walking.  You feel like you will pass out (faint), or you pass out.  You have really bad symptoms that are different than your first symptoms. MAKE SURE YOU:   Understand these instructions.  Will watch your condition.  Will get help right away if you are not doing well or get worse. Document Released: 06/21/2008 Document Revised: 09/17/2013 Document Reviewed: 05/20/2013 [facility] Patient Information 2015 [employer], [place]. This information is not intended to replace advice given to you by your health care provider. Make sure you discuss any questions you have with your health care provider.

## 2014-12-15 NOTE — Consult Note (Addendum)
Referring Physician: Oni    Chief Complaint: Aphasia, LUE numbness  HPI: Sandra Burns an 59 y.o. female who reports that on Sunday after noon she felt she was in a arrhythmia and despite measures taken at home staying in her abnormal rhythm for about 3 hours.  She then acutely began to feel poorly.  She became disoriented.  She experienced an expressive aphasia along with left facial numbness and LUE numbness.  She then had the onset of a left sided headache that increased in severity to a 10/10.  The patient has a history of migraines and has had all of these symptoms associated with her migraines in the past but these symptoms usually last about 5 minutes.  On this occasion the symptoms were severe for about 15 minutes and continued for another hour, although less severe.  Her husband prompted her to come to the ED when she had not returned to baseline after this prolonged period of time.  Code stroke was called on arrival.  Symptoms have now improved significantly with her headache now being rated at a 5/10 and her only having some mild difference in sensation on the left side of her face.  NIHSS of 1.    Date last known well: Date: 12/15/2014 Time last known well: Time: 00:00 tPA Given: No: Resolution of symptoms  Past Medical History  Diagnosis Date  . Asthma   . Fibromyalgia   . Arrhythmia   . Back pain   . Mitral valve prolapse   . Chronic obstructive airway disease   . Kidney stone   . Irritable bowel syndrome   . Diverticulosis   . GERD (gastroesophageal reflux disease)   . Paroxysmal a-fib   . Osteoporosis     Past Surgical History  Procedure Laterality Date  . Back surgery    . Abdominal hysterectomy    . Oophorectomy    . Cholecystectomy      Family history: Multiple family members with autoimmune disorders.    Social History:  reports that she has never smoked. She does not have any smokeless tobacco history on file. She reports that she does not drink  alcohol or use illicit drugs.  Allergies:  Allergies  Allergen Reactions  . Vancomycin Shortness Of Breath  . Vicodin [Hydrocodone-Acetaminophen] Shortness Of Breath  . Albuterol Other (See Comments)    Atrial fibrillation   . Amoxicillin Itching  . Cephalosporins Itching  . Ciprofloxacin Swelling  . Flagyl [Metronidazole] Dermatitis  . Macrobid [Nitrofurantoin Macrocrystal] Itching  . Sulfa Antibiotics Itching  . Decongest-Aid [Pseudoephedrine] Palpitations  . Prednisone Palpitations    Medications: I have reviewed the patient's current medications. Prior to Admission:  Current outpatient prescriptions:  .  ALPRAZolam (XANAX) 0.5 MG tablet, Take 0.75 mg by mouth 4 (four) times daily. , Disp: , Rfl:  .  aspirin 81 MG chewable tablet, Chew 81 mg by mouth daily., Disp: , Rfl:  .  atenolol (TENORMIN) 25 MG tablet, Take 12.5 mg by mouth 2 (two) times daily. , Disp: , Rfl:  .  budesonide (PULMICORT) 180 MCG/ACT inhaler, Inhale 2 puffs into the lungs 2 (two) times daily. , Disp: , Rfl:  .  doxycycline (VIBRAMYCIN) 100 MG capsule, Take 1 capsule (100 mg total) by mouth 2 (two) times daily. Start on evening of 12/22/13 if symptoms worsen. (Patient taking differently: Take 100 mg by mouth daily as needed (diverticulitis). Start on evening of 12/22/13 if symptoms worsen.), Disp: 14 capsule, Rfl: 0 .  fluticasone (  FLONASE) 50 MCG/ACT nasal spray, Place 2 sprays into the nose daily., Disp: , Rfl:  .  ipratropium (ATROVENT HFA) 17 MCG/ACT inhaler, Inhale 2 puffs into the lungs 4 (four) times daily., Disp: , Rfl:  .  lubiprostone (AMITIZA) 8 MCG capsule, Take 8 mcg by mouth 2 (two) times daily as needed (IBS). , Disp: , Rfl:  .  magnesium oxide (MAG-OX) 400 MG tablet, Take 400 mg by mouth daily as needed (AFIB)., Disp: , Rfl:  .  Multiple Vitamin (MULTIVITAMIN WITH MINERALS) TABS, Take 1 tablet by mouth daily., Disp: , Rfl:  .  pantoprazole (PROTONIX) 40 MG tablet, Take 40 mg by mouth daily as  needed. For stomach, Disp: , Rfl:  .  traMADol (ULTRAM) 50 MG tablet, Take 1 tablet (50 mg total) by mouth every 6 (six) hours as needed. For pain (Patient taking differently: Take 25 mg by mouth every 6 (six) hours as needed for moderate pain. For pain), Disp: 11 tablet, Rfl: 0 .  dicyclomine (BENTYL) 20 MG tablet, Take 1 tablet (20 mg total) by mouth 2 (two) times daily., Disp: 20 tablet, Rfl: 0 .  doxycycline (DORYX) 100 MG EC tablet, Take 100 mg by mouth 2 (two) times daily as needed. For rash on foot/pain, Disp: , Rfl:  .  Vitamin D, Ergocalciferol, (DRISDOL) 50000 UNITS CAPS capsule, Take 50,000 Units by mouth every 7 (seven) days. Has no certain days. Usually take it on weekends, Disp: , Rfl:   ROS: History obtained from the patient  General ROS: negative for - chills, fatigue, fever, night sweats, weight gain or weight loss Psychological ROS: negative for - behavioral disorder, hallucinations, memory difficulties, mood swings or suicidal ideation Ophthalmic ROS: negative for - blurry vision, double vision, eye pain or loss of vision ENT ROS: negative for - epistaxis, nasal discharge, oral lesions, sore throat, tinnitus or vertigo Allergy and Immunology ROS: negative for - hives or itchy/watery eyes Hematological and Lymphatic ROS: negative for - bleeding problems, bruising or swollen lymph nodes Endocrine ROS: negative for - galactorrhea, hair pattern changes, polydipsia/polyuria or temperature intolerance Respiratory ROS: negative for - cough, hemoptysis, shortness of breath or wheezing Cardiovascular ROS: negative for - chest pain, dyspnea on exertion, edema or irregular heartbeat Gastrointestinal ROS: negative for - abdominal pain, diarrhea, hematemesis, nausea/vomiting or stool incontinence Genito-Urinary ROS: negative for - dysuria, hematuria, incontinence or urinary frequency/urgency Musculoskeletal ROS: chronic back pain Neurological ROS: as noted in HPI Dermatological ROS:  negative for rash and skin lesion changes  Physical Examination: Blood pressure 143/67, pulse 57, resp. rate 11, SpO2 98 %.  HEENT-  Normocephalic, no lesions, without obvious abnormality.  Normal external eye and conjunctiva.  Normal TM's bilaterally.  Normal auditory canals and external ears. Normal external nose, mucus membranes and septum.  Normal pharynx. Cardiovascular- S1, S2 normal, pulses palpable throughout   Lungs- chest clear, no wheezing, rales, normal symmetric air entry Abdomen- soft, non-tender; bowel sounds normal; no masses,  no organomegaly Extremities- no edema Lymph-no adenopathy palpable Musculoskeletal-no joint tenderness, deformity or swelling Skin-warm and dry, no hyperpigmentation, vitiligo, or suspicious lesions  Neurological Examination Mental Status: Alert, oriented, thought content appropriate.  Speech fluent without evidence of aphasia.  Able to follow 3 step commands without difficulty.  Flat affect.  Cranial Nerves: II: Discs flat bilaterally; Visual fields grossly normal, pupils equal, round, reactive to light and accommodation III,IV, VI: mild right ptosis, extra-ocular motions intact bilaterally V,VII: smile symmetric, facial light touch sensation decreased on the left VIII:  hearing normal bilaterally IX,X: gag reflex present XI: bilateral shoulder shrug XII: midline tongue extension Motor: Right : Upper extremity   5/5    Left:     Upper extremity   5/5  Lower extremity   5/5     Lower extremity   5/5 Tone and bulk:normal tone throughout; no atrophy noted Sensory: Pinprick and light touch intact throughout, bilaterally Deep Tendon Reflexes: 2+ and symmetric throughout Plantars: Right: downgoing   Left: downgoing Cerebellar: normal finger-to-nose and normal heel-to-shin testing bilaterally Gait: not tested due to safety concerns    Laboratory Studies:  Basic Metabolic Panel:  Recent Labs Lab 12/15/14 0222  NA 137  K 3.1*  CL 96   GLUCOSE 99  BUN 5*  CREATININE 0.70    Liver Function Tests: No results for input(s): AST, ALT, ALKPHOS, BILITOT, PROT, ALBUMIN in the last 168 hours. No results for input(s): LIPASE, AMYLASE in the last 168 hours. No results for input(s): AMMONIA in the last 168 hours.  CBC:  Recent Labs Lab 12/15/14 0214 12/15/14 0222  WBC 9.2  --   NEUTROABS 4.7  --   HGB 14.9 17.0*  HCT 44.9 50.0*  MCV 92.4  --   PLT 247  --     Cardiac Enzymes:  Recent Labs Lab 12/15/14 0214  CKTOTAL 37  CKMB 0.9    BNP: Invalid input(s): POCBNP  CBG:  Recent Labs Lab 12/15/14 0213  GLUCAP 101*    Microbiology: Results for orders placed or performed during the hospital encounter of 03/24/09  Urine culture     Status: None   Collection Time: 03/24/09  1:00 PM  Result Value Ref Range Status   Specimen Description URINE, CLEAN CATCH  Final   Special Requests NONE  Final   Colony Count 4,000 COLONIES/ML  Final   Culture INSIGNIFICANT GROWTH  Final   Report Status 03/25/2009 FINAL  Final    Coagulation Studies: No results for input(s): LABPROT, INR in the last 72 hours.  Urinalysis: No results for input(s): COLORURINE, LABSPEC, PHURINE, GLUCOSEU, HGBUR, BILIRUBINUR, KETONESUR, PROTEINUR, UROBILINOGEN, NITRITE, LEUKOCYTESUR in the last 168 hours.  Invalid input(s): APPERANCEUR  Lipid Panel: No results found for: CHOL, TRIG, HDL, CHOLHDL, VLDL, LDLCALC  HgbA1C: No results found for: HGBA1C  Urine Drug Screen:     Component Value Date/Time   LABOPIA NONE DETECTED 03/12/2011 1623   COCAINSCRNUR NONE DETECTED 03/12/2011 1623   LABBENZ POSITIVE* 03/12/2011 1623   AMPHETMU NONE DETECTED 03/12/2011 1623   THCU NONE DETECTED 03/12/2011 1623   LABBARB NONE DETECTED 03/12/2011 1623    Alcohol Level:  Recent Labs Lab 12/15/14 0214  ETH <5    Other results: EKG: sinus rhythm at 54 bpm.  Imaging: Ct Head Wo Contrast  12/15/2014   CLINICAL DATA:  Sudden onset confusion and  inability to get her words out 2 hours ago  EXAM: CT HEAD WITHOUT CONTRAST  TECHNIQUE: Contiguous axial images were obtained from the base of the skull through the vertex without intravenous contrast.  COMPARISON:  09/19/2009  FINDINGS: There Burns no intracranial hemorrhage, mass or evidence of acute infarction. Gray matter and white matter are normal. The ventricles and basal cisterns appear unremarkable.  The bony structures are intact. The visible portions of the paranasal sinuses are clear.  IMPRESSION: Normal brain.  These results were called by telephone at the time of interpretation on 12/15/2014 at 2:23 am to Dr. Tomasita Crumble , who verbally acknowledged these results.   Electronically Signed  By: Ellery Plunk M.D.   On: 12/15/2014 02:24    Assessment: 59 y.o. female with a history of complicated migraine and atrial fibrillation who today had focal neurological symptoms associated with headache that were unusual from her normal presentation. Smyptom have now resolved for the most part but due to relationship to her paroxysmal afib further work up indicated.  Head CT personally reviewed and shows no acute changes.  Patient on ASA at home.    Stroke Risk Factors - atrial fibrillation  Plan: 1. MRI of the brain without contrast.  If MRI shows no acute changes no further neurological investigation indicated at this time.   2. Telemetry monitoring 3. Frequent neuro checks 4. Headache has improved to the point that no analgesia indicated at this time  Case discussed with Dr. Alferd Apa, MD [facility] [phone] 12/15/2014, 3:46 AM

## 2014-12-15 NOTE — ED Notes (Signed)
Husband reports pt had sudden onset of confusion and inability to "get her words out" at 12:05am.  Pt states it felt like she was "unresponsive" and doesn't remember events.  Pt states she is still having difficulty with words.  Code Stroke activated from triage and pt straight to Pod A for Dr. Mora Bellmanni to assess airway.  Labs drawn and pt to CT with Liz BeachGabe, RN.

## 2014-12-15 NOTE — ED Provider Notes (Signed)
CSN: [id number]     Arrival date & time 12/15/14  0202 History  This chart was scribed for Tomasita CrumbleAdeleke Ariez Neilan, MD by Tanda RockersMargaux Venter, ED Scribe. This patient was seen in room A05C/A05C and the patient's care was started at 2:35 AM.    Chief Complaint  Patient presents with  . Code Stroke   The history is provided by the patient and the spouse. No language interpreter was used.     HPI Comments: Sandra Roachammy H Folmar is a 59 y.o. female who presents to the Emergency Department complaining of code stroke that began tonight around 12:05 AM. Pt reports that she has history of arrhythmias in her hands and was having an episode yesterday for about 3 hours ago. Pt admits to taking Atenolol which finally relieved her symptoms. She states that she felt fine afterwards until tonight upon getting ready for bed. Pt claims that she was watching television when she began feeling "gone" for about 1 minute. She also reports having extreme aphasia. Pt mentions that she has had similar symptoms of aphasia and left sided numbness due to her occular migraines. She states that the aphasia tonight was much worse than normal. Pt also complains of left arm weakness tonight. Spouse reports that pt was having symptoms for about 1 hour tonight with extreme confusion for approximately 20 minutes. He states that pt was holding head and stating that she was having pain in her head. He also reports pt was having difficulty getting words out. Pt does admit to increased stress in her life as of late. She states that she feels much better now. She does mention that she is currently treating a corneal abrasion which is the cause of her drooping eye. Pt has FHx of strokes.     Past Medical History  Diagnosis Date  . Asthma   . Fibromyalgia   . Arrhythmia   . Back pain   . Mitral valve prolapse   . Chronic obstructive airway disease   . Kidney stone   . Irritable bowel syndrome   . Diverticulosis   . GERD (gastroesophageal reflux disease)    . Paroxysmal a-fib   . Osteoporosis    Past Surgical History  Procedure Laterality Date  . Back surgery    . Abdominal hysterectomy    . Oophorectomy    . Cholecystectomy     No family history on file. History  Substance Use Topics  . Smoking status: Never Smoker   . Smokeless tobacco: Not on file  . Alcohol Use: No   OB History    No data available     Review of Systems  10 Systems reviewed and all are negative for acute change except as noted in the HPI.    Allergies  Vancomycin; Vicodin; Albuterol; Amoxicillin; Cephalosporins; Ciprofloxacin; Flagyl; Macrobid; Sulfa antibiotics; Decongest-aid; and Prednisone  Home Medications   Prior to Admission medications   Medication Sig Start Date End Date Taking? Authorizing Provider  ALPRAZolam Prudy Feeler(XANAX) 0.5 MG tablet Take 0.75 mg by mouth 4 (four) times daily.     Historical Provider, MD  aspirin 81 MG chewable tablet Chew 81 mg by mouth daily.    Historical Provider, MD  atenolol (TENORMIN) 25 MG tablet Take 12.5 mg by mouth 2 (two) times daily.     Historical Provider, MD  budesonide (PULMICORT) 180 MCG/ACT inhaler Inhale 1 puff into the lungs 2 (two) times daily.    Historical Provider, MD  dicyclomine (BENTYL) 20 MG tablet Take 1 tablet (20  mg total) by mouth 2 (two) times daily. 11/04/14   Rolland Porter, MD  doxycycline (DORYX) 100 MG EC tablet Take 100 mg by mouth 2 (two) times daily as needed. For rash on foot/pain    Historical Provider, MD  doxycycline (VIBRAMYCIN) 100 MG capsule Take 1 capsule (100 mg total) by mouth 2 (two) times daily. Start on evening of 12/22/13 if symptoms worsen. 12/22/13   Antony Madura, PA-C  fluticasone (FLONASE) 50 MCG/ACT nasal spray Place 2 sprays into the nose daily.    Historical Provider, MD  ipratropium (ATROVENT HFA) 17 MCG/ACT inhaler Inhale 2 puffs into the lungs 4 (four) times daily.    Historical Provider, MD  lubiprostone (AMITIZA) 8 MCG capsule Take 8 mcg by mouth 2 (two) times daily as  needed for constipation.     Historical Provider, MD  Multiple Vitamin (MULTIVITAMIN WITH MINERALS) TABS Take 1 tablet by mouth daily.    Historical Provider, MD  pantoprazole (PROTONIX) 40 MG tablet Take 40 mg by mouth daily as needed. For stomach    Historical Provider, MD  traMADol (ULTRAM) 50 MG tablet Take 1 tablet (50 mg total) by mouth every 6 (six) hours as needed. For pain 12/21/13   Antony Madura, PA-C  Vitamin D, Ergocalciferol, (DRISDOL) 50000 UNITS CAPS capsule Take 50,000 Units by mouth every 7 (seven) days. Has no certain days. Usually take it on weekends    Historical Provider, MD   Triage Vitals: BP 143/67 mmHg  Pulse 57  Resp 11  SpO2 98%   Physical Exam  Constitutional: She is oriented to person, place, and time. She appears well-developed and well-nourished. No distress.  HENT:  Head: Normocephalic and atraumatic.  Nose: Nose normal.  Mouth/Throat: Oropharynx is clear and moist. No oropharyngeal exudate.  Eyes: EOM are normal. Pupils are equal, round, and reactive to light. No scleral icterus.  There is some redness to right eye.   Neck: Normal range of motion. Neck supple. No JVD present. No tracheal deviation present. No thyromegaly present.  Cardiovascular: Regular rhythm and normal heart sounds.  Bradycardia present.  Exam reveals no gallop and no friction rub.   No murmur heard. Pulmonary/Chest: Effort normal and breath sounds normal. No respiratory distress. She has no wheezes. She exhibits no tenderness.  Abdominal: Soft. Bowel sounds are normal. She exhibits no distension and no mass. There is no tenderness. There is no rebound and no guarding.  Musculoskeletal: Normal range of motion. She exhibits no edema or tenderness.  Lymphadenopathy:    She has no cervical adenopathy.  Neurological: She is alert and oriented to person, place, and time. She has normal strength. No cranial nerve deficit. She exhibits normal muscle tone.  Normal sensation to all 4 extremities.  Normal cerebella testing.   Skin: Skin is warm and dry. No rash noted. No erythema. No pallor.  Nursing note and vitals reviewed.   ED Course  Procedures (including critical care time)  DIAGNOSTIC STUDIES: Oxygen Saturation is 98% on RA, normal by my interpretation.    COORDINATION OF CARE: 2:44 AM-Discussed treatment plan which includes CT Head, consult with neurologist on call with pt at bedside and pt agreed to plan.   Labs Review Labs Reviewed  URINE RAPID DRUG SCREEN (HOSP PERFORMED) - Abnormal; Notable for the following:    Benzodiazepines POSITIVE (*)    All other components within normal limits  URINALYSIS, ROUTINE W REFLEX MICROSCOPIC - Abnormal; Notable for the following:    Color, Urine STRAW (*)  Specific Gravity, Urine 1.003 (*)    All other components within normal limits  I-STAT CHEM 8, ED - Abnormal; Notable for the following:    Potassium 3.1 (*)    BUN 5 (*)    Hemoglobin 17.0 (*)    HCT 50.0 (*)    All other components within normal limits  CBG MONITORING, ED - Abnormal; Notable for the following:    Glucose-Capillary 101 (*)    All other components within normal limits  ETHANOL  APTT  CBC WITH DIFFERENTIAL/PLATELET  CK TOTAL AND CKMB  CBC  DIFFERENTIAL  I-STAT TROPOININ, ED    Imaging Review Ct Head Wo Contrast  12/15/2014   CLINICAL DATA:  Sudden onset confusion and inability to get her words out 2 hours ago  EXAM: CT HEAD WITHOUT CONTRAST  TECHNIQUE: Contiguous axial images were obtained from the base of the skull through the vertex without intravenous contrast.  COMPARISON:  09/19/2009  FINDINGS: There is no intracranial hemorrhage, mass or evidence of acute infarction. Gray matter and white matter are normal. The ventricles and basal cisterns appear unremarkable.  The bony structures are intact. The visible portions of the paranasal sinuses are clear.  IMPRESSION: Normal brain.  These results were called by telephone at the time of interpretation  on 12/15/2014 at 2:23 am to Dr. Tomasita Crumble , who verbally acknowledged these results.   Electronically Signed   By: Ellery Plunk M.D.   On: 12/15/2014 02:24     EKG Interpretation   Date/Time:  Monday December 15 2014 02:27:23 EDT Ventricular Rate:  54 PR Interval:  161 QRS Duration: 76 QT Interval:  419 QTC Calculation: 397 R Axis:   78 Text Interpretation:  Sinus rhythm No significant change since last  tracing Confirmed by Erroll Luna [phone]) on 12/15/2014 2:33:59 AM      MDM   Final diagnoses:  None   patient since emergency department for possible stroke. Her history is not consistent with stroke however. She states she has complex migraine headaches at times and her symptoms this time was worse, including a fascia and weakness in her left upper extremity. Nevertheless, patient is at high risk bus code stroke was called by triage nurse and Dr. Thad Ranger with neurology evaluated the patient. She recommends for MRI and if negative no further workup indicated. MRI results do reveal a normal study. Patient's headache is resolved at this point and she does not require pain medications. Patient be advised to follow-up with her primary neurologist within 3 days for continued management. Her vital signs remain within her normal limits and she is safe for discharge.  I personally performed the services described in this documentation, which was scribed in my presence. The recorded information has been reviewed and is accurate.      Tomasita Crumble, MD 12/15/14 0600

## 2015-07-10 ENCOUNTER — Other Ambulatory Visit: Payer: Self-pay | Admitting: Internal Medicine

## 2015-07-10 DIAGNOSIS — Z1231 Encounter for screening mammogram for malignant neoplasm of breast: Secondary | ICD-10-CM

## 2015-07-17 ENCOUNTER — Ambulatory Visit: Payer: Medicare Other

## 2015-07-22 ENCOUNTER — Ambulatory Visit
Admission: RE | Admit: 2015-07-22 | Discharge: 2015-07-22 | Disposition: A | Discharge status: Home / Self Care | Payer: Medicare Other | Source: Ambulatory Visit | Attending: Internal Medicine | Admitting: Internal Medicine

## 2015-07-22 DIAGNOSIS — Z1231 Encounter for screening mammogram for malignant neoplasm of breast: Secondary | ICD-10-CM | POA: Insufficient documentation

## 2015-07-22 HISTORY — DX: Personal history of antineoplastic chemotherapy: Z92.21

## 2015-08-28 ENCOUNTER — Ambulatory Visit
Admission: RE | Admit: 2015-08-28 | Discharge: 2015-08-28 | Disposition: A | Discharge status: Home / Self Care | Payer: Medicare Other | Source: Ambulatory Visit | Attending: Cardiology | Admitting: Cardiology

## 2015-08-28 ENCOUNTER — Other Ambulatory Visit: Payer: Self-pay | Admitting: Cardiology

## 2015-08-28 DIAGNOSIS — R600 Localized edema: Secondary | ICD-10-CM | POA: Diagnosis not present

## 2016-05-19 ENCOUNTER — Other Ambulatory Visit: Payer: Self-pay | Admitting: Pediatrics

## 2016-05-19 DIAGNOSIS — R1032 Left lower quadrant pain: Secondary | ICD-10-CM

## 2016-05-25 ENCOUNTER — Ambulatory Visit: Admission: RE | Admit: 2016-05-25 | Payer: Medicare Other | Source: Ambulatory Visit

## 2016-05-25 ENCOUNTER — Ambulatory Visit (INDEPENDENT_AMBULATORY_CARE_PROVIDER_SITE_OTHER): Payer: No Typology Code available for payment source | Admitting: Psychiatry

## 2016-05-25 DIAGNOSIS — F331 Major depressive disorder, recurrent, moderate: Secondary | ICD-10-CM

## 2016-06-14 ENCOUNTER — Ambulatory Visit: Payer: Medicare Other | Admitting: Psychiatry

## 2016-06-28 ENCOUNTER — Ambulatory Visit (INDEPENDENT_AMBULATORY_CARE_PROVIDER_SITE_OTHER): Payer: No Typology Code available for payment source | Admitting: Psychiatry

## 2016-06-28 DIAGNOSIS — F331 Major depressive disorder, recurrent, moderate: Secondary | ICD-10-CM

## 2016-07-05 ENCOUNTER — Ambulatory Visit (INDEPENDENT_AMBULATORY_CARE_PROVIDER_SITE_OTHER): Payer: No Typology Code available for payment source | Admitting: Psychiatry

## 2016-07-05 DIAGNOSIS — F331 Major depressive disorder, recurrent, moderate: Secondary | ICD-10-CM

## 2016-07-14 ENCOUNTER — Ambulatory Visit: Payer: Medicare Other | Admitting: Psychiatry

## 2016-07-19 ENCOUNTER — Ambulatory Visit: Payer: Medicare Other | Admitting: Psychiatry

## 2016-07-27 ENCOUNTER — Ambulatory Visit (INDEPENDENT_AMBULATORY_CARE_PROVIDER_SITE_OTHER): Payer: No Typology Code available for payment source | Admitting: Psychiatry

## 2016-07-27 DIAGNOSIS — F331 Major depressive disorder, recurrent, moderate: Secondary | ICD-10-CM

## 2016-08-16 ENCOUNTER — Ambulatory Visit: Payer: No Typology Code available for payment source | Admitting: Psychiatry

## 2017-03-23 ENCOUNTER — Other Ambulatory Visit: Payer: Self-pay | Admitting: Pediatrics

## 2017-03-23 DIAGNOSIS — Z1231 Encounter for screening mammogram for malignant neoplasm of breast: Secondary | ICD-10-CM

## 2017-04-07 ENCOUNTER — Ambulatory Visit
Admission: RE | Admit: 2017-04-07 | Discharge: 2017-04-07 | Disposition: A | Discharge status: Home / Self Care | Payer: Medicare Other | Source: Ambulatory Visit | Attending: Pediatrics | Admitting: Pediatrics

## 2017-04-07 DIAGNOSIS — Z1231 Encounter for screening mammogram for malignant neoplasm of breast: Secondary | ICD-10-CM | POA: Insufficient documentation

## 2017-04-12 ENCOUNTER — Encounter (HOSPITAL_COMMUNITY): Payer: Self-pay | Admitting: *Deleted

## 2017-04-12 ENCOUNTER — Emergency Department (HOSPITAL_COMMUNITY): Payer: Medicare Other

## 2017-04-12 ENCOUNTER — Emergency Department (HOSPITAL_COMMUNITY)
Admission: EM | Admit: 2017-04-12 | Discharge: 2017-04-12 | Disposition: A | Discharge status: Home / Self Care | Payer: Medicare Other | Attending: Emergency Medicine | Admitting: Emergency Medicine

## 2017-04-12 DIAGNOSIS — K5792 Diverticulitis of intestine, part unspecified, without perforation or abscess without bleeding: Secondary | ICD-10-CM | POA: Insufficient documentation

## 2017-04-12 DIAGNOSIS — Z7982 Long term (current) use of aspirin: Secondary | ICD-10-CM | POA: Diagnosis not present

## 2017-04-12 DIAGNOSIS — Z79899 Other long term (current) drug therapy: Secondary | ICD-10-CM | POA: Diagnosis not present

## 2017-04-12 DIAGNOSIS — J449 Chronic obstructive pulmonary disease, unspecified: Secondary | ICD-10-CM | POA: Insufficient documentation

## 2017-04-12 DIAGNOSIS — J45909 Unspecified asthma, uncomplicated: Secondary | ICD-10-CM | POA: Insufficient documentation

## 2017-04-12 DIAGNOSIS — R1084 Generalized abdominal pain: Secondary | ICD-10-CM | POA: Diagnosis present

## 2017-04-12 DIAGNOSIS — Z8541 Personal history of malignant neoplasm of cervix uteri: Secondary | ICD-10-CM | POA: Diagnosis not present

## 2017-04-12 HISTORY — DX: Arthropathic psoriasis, unspecified: L40.50

## 2017-04-12 HISTORY — DX: Raynaud's syndrome without gangrene: I73.00

## 2017-04-12 HISTORY — DX: Reserved for concepts with insufficient information to code with codable children: IMO0002

## 2017-04-12 HISTORY — DX: Migraine, unspecified, not intractable, without status migrainosus: G43.909

## 2017-04-12 HISTORY — DX: Unspecified osteoarthritis, unspecified site: M19.90

## 2017-04-12 HISTORY — DX: Systemic lupus erythematosus, unspecified: M32.9

## 2017-04-12 HISTORY — DX: Unspecified asthma, uncomplicated: J45.909

## 2017-04-12 LAB — URINALYSIS, ROUTINE W REFLEX MICROSCOPIC
Bilirubin Urine: NEGATIVE
Glucose, UA: NEGATIVE mg/dL
Hgb urine dipstick: NEGATIVE
Ketones, ur: NEGATIVE mg/dL
Leukocytes, UA: NEGATIVE
Nitrite: NEGATIVE
Protein, ur: NEGATIVE mg/dL
Specific Gravity, Urine: 1.004 — ABNORMAL LOW (ref 1.005–1.030)
pH: 7 (ref 5.0–8.0)

## 2017-04-12 LAB — COMPREHENSIVE METABOLIC PANEL
ALT: 23 U/L (ref 14–54)
AST: 35 U/L (ref 15–41)
Albumin: 4 g/dL (ref 3.5–5.0)
Alkaline Phosphatase: 77 U/L (ref 38–126)
Anion gap: 8 (ref 5–15)
BUN: 7 mg/dL (ref 6–20)
CO2: 27 mmol/L (ref 22–32)
Calcium: 9.3 mg/dL (ref 8.9–10.3)
Chloride: 98 mmol/L — ABNORMAL LOW (ref 101–111)
Creatinine, Ser: 0.73 mg/dL (ref 0.44–1.00)
GFR calc Af Amer: >60 mL/min (ref >60)
GFR calc non Af Amer: >60 mL/min (ref >60)
Glucose, Bld: 91 mg/dL (ref 65–99)
Potassium: 4.8 mmol/L (ref 3.5–5.1)
Sodium: 133 mmol/L — ABNORMAL LOW (ref 135–145)
Total Bilirubin: 1.4 mg/dL — ABNORMAL HIGH (ref 0.3–1.2)
Total Protein: 7.6 g/dL (ref 6.5–8.1)

## 2017-04-12 LAB — CBC
HCT: 46.6 % — ABNORMAL HIGH (ref 36.0–46.0)
Hemoglobin: 15.5 g/dL — ABNORMAL HIGH (ref 12.0–15.0)
MCH: 31.1 pg (ref 26.0–34.0)
MCHC: 33.3 g/dL (ref 30.0–36.0)
MCV: 93.4 fL (ref 78.0–100.0)
Platelets: 291 10*3/uL (ref 150–400)
RBC: 4.99 MIL/uL (ref 3.87–5.11)
RDW: 13 % (ref 11.5–15.5)
WBC: 13.3 10*3/uL — ABNORMAL HIGH (ref 4.0–10.5)

## 2017-04-12 LAB — LIPASE, BLOOD: Lipase: 37 U/L (ref 11–51)

## 2017-04-12 MED ORDER — ONDANSETRON HCL 4 MG/2ML IJ SOLN: 4.0000 mg | Freq: Once | INTRAMUSCULAR | Status: DC

## 2017-04-12 MED ORDER — KETOROLAC TROMETHAMINE 30 MG/ML IJ SOLN: 15.0000 mg | Freq: Once | INTRAMUSCULAR | Status: DC

## 2017-04-12 MED ORDER — DOXYCYCLINE HYCLATE 100 MG PO TABS: 100.0000 mg | ORAL_TABLET | Freq: Two times a day (BID) | ORAL | 0 refills | Status: AC

## 2017-04-12 MED ORDER — FENTANYL CITRATE (PF) 100 MCG/2ML IJ SOLN: 50.0000 ug | Freq: Once | INTRAMUSCULAR | Status: DC

## 2017-04-12 MED ORDER — LACTATED RINGERS IV BOLUS (SEPSIS)
1000.0000 mL | Freq: Once | INTRAVENOUS | Status: AC
  Administered 2017-04-12: 1000 mL via INTRAVENOUS

## 2017-04-12 NOTE — ED Notes (Signed)
Patient transported to CT 

## 2017-04-12 NOTE — ED Provider Notes (Addendum)
[facility]-EMERGENCY DEPT Provider Note   CSN: [id number] Arrival date & time: 04/12/17  1007     History   Chief Complaint Chief Complaint  Patient presents with  . Flank Pain  . Abdominal Pain    HPI Sandra Burns is a 61 y.o. female.   Flank Pain  This is a recurrent problem. The current episode started 2 days ago. The problem occurs constantly. The problem has been gradually worsening. Associated symptoms include abdominal pain. Nothing aggravates the symptoms. Nothing relieves the symptoms.  Abdominal Pain      Past Medical History:  Diagnosis Date  . Arrhythmia   . Arthritis   . Asthma   . Back pain   . Chronic obstructive airway disease (HCC)   . Diverticulosis   . Fibromyalgia   . Fibromyalgia   . GERD (gastroesophageal reflux disease)   . Irritable bowel syndrome   . Kidney stone   . Lupus    "suspected"  . Migraine   . Mitral valve prolapse   . Osteoporosis   . Paroxysmal A-fib (HCC)   . Psoriatic arthritis (HCC)   . Raynaud's disease   . Reactive airway disease   . S/P chemotherapy, time since greater than 12 weeks 1984   cervical ca    There are no active problems to display for this patient.   Past Surgical History:  Procedure Laterality Date  . ABDOMINAL HYSTERECTOMY    . BACK SURGERY    . BREAST BIOPSY Left 20+ YRS AGO   EXCISIONAL - NEG  . CHOLECYSTECTOMY    . OOPHORECTOMY      OB History    No data available       Home Medications    Prior to Admission medications   Medication Sig Start Date End Date Taking? Authorizing Provider  ALPRAZolam Prudy Feeler) 0.5 MG tablet Take 0.5 mg by mouth See admin instructions. Pt takes 0.75mg  in morning, afternoon, 0.5mg  with dinner, and 0.75mg  at bedtime   Yes [provider]  Artificial Tear Ointment (REFRESH P.M. OP) Place 1 application into both eyes at bedtime. Apply eye gel to both eyelids   Yes [provider]  aspirin 81 MG chewable tablet Chew 81 mg by mouth daily.    Yes [provider]  atenolol (TENORMIN) 25 MG tablet Take 12.5 mg by mouth 2 (two) times daily.    Yes [provider]  budesonide (PULMICORT) 180 MCG/ACT inhaler Inhale 2 puffs into the lungs 2 (two) times daily.    Yes [provider]  ipratropium (ATROVENT HFA) 17 MCG/ACT inhaler Inhale 2 puffs into the lungs 4 (four) times daily.   Yes [provider]  Multiple Vitamin (MULTIVITAMIN WITH MINERALS) TABS Take 1 tablet by mouth daily.   Yes [provider]  pantoprazole (PROTONIX) 40 MG tablet Take 40 mg by mouth daily as needed. For stomach   Yes [provider]  Polyvinyl Alcohol-Povidone (REFRESH OP) Place 1 drop into both eyes daily as needed (dry eyes).   Yes [provider]  doxycycline (VIBRA-TABS) 100 MG tablet Take 1 tablet (100 mg total) by mouth 2 (two) times daily. 04/12/17 04/22/17  Maybel Dambrosio, Barbara Cower, MD    Family History Family History  Problem Relation Age of Onset  . Breast cancer Neg Hx     Social History Social History  Substance Use Topics  . Smoking status: Never Smoker  . Smokeless tobacco: Never Used  . Alcohol use No     Allergies  Albuterol; Amoxicillin; Cephalosporins; Ciprofloxacin; Flagyl [metronidazole]; Macrobid [nitrofurantoin macrocrystal]; Sulfa antibiotics; Vancomycin; Vicodin [hydrocodone-acetaminophen]; Zithromax [azithromycin]; Decongest-aid [pseudoephedrine]; Levofloxacin; and Prednisone   Review of Systems Review of Systems  Gastrointestinal: Positive for abdominal pain.  Genitourinary: Positive for flank pain.  All other systems reviewed and are negative.    Physical Exam Updated Vital Signs BP (!) 106/93   Pulse 70   Temp 98.7 F (37.1 C) (Oral)   Resp 15   Ht 5\' 3"  (1.6 m)   Wt 49 kg (108 lb)   SpO2 99%   BMI 19.13 kg/m   Physical Exam  Constitutional: She is oriented to person, place, and time. She appears well-developed and well-nourished.  HENT:  Head:  Normocephalic and atraumatic.  Eyes: Conjunctivae and EOM are normal.  Neck: Normal range of motion.  Cardiovascular: Normal rate and regular rhythm.   Pulmonary/Chest: Effort normal and breath sounds normal. No stridor. No respiratory distress. She has no wheezes.  Abdominal: Soft. Bowel sounds are normal. She exhibits no distension. There is tenderness (suprapubic and LLQ).  Musculoskeletal: Normal range of motion. She exhibits no edema or deformity.  Neurological: She is alert and oriented to person, place, and time. No cranial nerve deficit.  Skin: Skin is warm and dry.  Nursing note and vitals reviewed.    ED Treatments / Results  Labs (all labs ordered are listed, but only abnormal results are displayed) Labs Reviewed  COMPREHENSIVE METABOLIC PANEL - Abnormal; Notable for the following:       Result Value   Sodium 133 (*)    Chloride 98 (*)    Total Bilirubin 1.4 (*)    All other components within normal limits  CBC - Abnormal; Notable for the following:    WBC 13.3 (*)    Hemoglobin 15.5 (*)    HCT 46.6 (*)    All other components within normal limits  URINALYSIS, ROUTINE W REFLEX MICROSCOPIC - Abnormal; Notable for the following:    Color, Urine STRAW (*)    Specific Gravity, Urine 1.004 (*)    All other components within normal limits  LIPASE, BLOOD    EKG  EKG Interpretation None       Radiology Ct Renal Stone Study  Result Date: 04/12/2017 CLINICAL DATA:  Left-sided flank pain for 2 days, initial encounter EXAM: CT ABDOMEN AND PELVIS WITHOUT CONTRAST TECHNIQUE: Multidetector CT imaging of the abdomen and pelvis was performed following the standard protocol without IV contrast. COMPARISON:  11/04/2014 FINDINGS: Lower chest: Stable subpleural nodule is noted in the right middle lobe. The lung bases are free of acute infiltrate. Some scarring remains in the lingula. Hepatobiliary: No focal liver abnormality is seen. Status post cholecystectomy. No biliary  dilatation. Pancreas: Unremarkable. No pancreatic ductal dilatation or surrounding inflammatory changes. Spleen: Splenic granuloma is noted. No other focal abnormality is seen. Adrenals/Urinary Tract: The adrenal glands are within normal limits bilaterally. The kidneys demonstrate scattered small nonobstructing renal stones on the right. Prominent extrarenal pelvis is noted on the right. The bladder is well distended. No ureteral stones are seen. Stomach/Bowel: Stomach is within normal limits. Appendix appears normal. Scattered colonic diverticular changes are noted. Some pericolonic inflammatory changes are noted in the sigmoid colon consistent with diverticulitis. No perforation or abscess formation is identified. Vascular/Lymphatic: Aortic atherosclerosis. No enlarged abdominal or pelvic lymph nodes. Reproductive: Status post hysterectomy. No adnexal masses. Other: No abdominal wall hernia or abnormality. No abdominopelvic ascites. Musculoskeletal: Scoliosis is again seen concave to the right with fixation rods  in place. IMPRESSION: Mild early diverticulitis in the sigmoid colon. No perforation or abscess is noted. Nonobstructing right renal stone. Chronic changes as described above. Electronically Signed   By: Alcide Clever M.D.   On: 04/12/2017 15:35    Procedures Procedures (including critical care time)  Medications Ordered in ED Medications  fentaNYL (SUBLIMAZE) injection 50 mcg (not administered)  ondansetron (ZOFRAN) injection 4 mg (not administered)  ketorolac (TORADOL) 30 MG/ML injection 15 mg (not administered)  lactated ringers bolus 1,000 mL (1,000 mLs Intravenous New Bag/Given 04/12/17 1316)     Initial Impression / Assessment and Plan / ED Course  I have reviewed the triage vital signs and the nursing notes.  Pertinent labs & imaging results that were available during my care of the patient were reviewed by me and considered in my medical decision making (see chart for details).       Diverticulitis but no e/o sepsis. Doesn't want pain medications. Has extensive allergy list but states she has had improvement with docycyline in the past for which she does not have an allergy. Will have close follow up with PCP and strict return precautions for any evidence of worsening or not improving.   Final Clinical Impressions(s) / ED Diagnoses   Final diagnoses:  Diverticulitis    New Prescriptions New Prescriptions   DOXYCYCLINE (VIBRA-TABS) 100 MG TABLET    Take 1 tablet (100 mg total) by mouth 2 (two) times daily.     Marily Memos, MD 04/12/17 [phone]

## 2017-04-12 NOTE — ED Triage Notes (Signed)
Pt states L flank pain since yesterday and lower abdominal pain today.

## 2017-05-04 DIAGNOSIS — H18509 Unspecified hereditary corneal dystrophies, unspecified eye: Secondary | ICD-10-CM | POA: Insufficient documentation

## 2017-05-12 ENCOUNTER — Emergency Department (HOSPITAL_COMMUNITY)
Admission: EM | Admit: 2017-05-12 | Discharge: 2017-05-12 | Disposition: A | Discharge status: Home / Self Care | Payer: Medicare Other | Attending: Emergency Medicine | Admitting: Emergency Medicine

## 2017-05-12 ENCOUNTER — Encounter (HOSPITAL_COMMUNITY): Payer: Self-pay | Admitting: *Deleted

## 2017-05-12 DIAGNOSIS — R1032 Left lower quadrant pain: Secondary | ICD-10-CM | POA: Insufficient documentation

## 2017-05-12 DIAGNOSIS — Z5321 Procedure and treatment not carried out due to patient leaving prior to being seen by health care provider: Secondary | ICD-10-CM | POA: Diagnosis not present

## 2017-05-12 LAB — COMPREHENSIVE METABOLIC PANEL
ALT: 16 U/L (ref 14–54)
AST: 28 U/L (ref 15–41)
Albumin: 3.9 g/dL (ref 3.5–5.0)
Alkaline Phosphatase: 64 U/L (ref 38–126)
Anion gap: 8 (ref 5–15)
BUN: <5 mg/dL — ABNORMAL LOW (ref 6–20)
CO2: 28 mmol/L (ref 22–32)
Calcium: 9.1 mg/dL (ref 8.9–10.3)
Chloride: 103 mmol/L (ref 101–111)
Creatinine, Ser: 0.61 mg/dL (ref 0.44–1.00)
GFR calc Af Amer: >60 mL/min (ref >60)
GFR calc non Af Amer: >60 mL/min (ref >60)
Glucose, Bld: 89 mg/dL (ref 65–99)
Potassium: 3.8 mmol/L (ref 3.5–5.1)
Sodium: 139 mmol/L (ref 135–145)
Total Bilirubin: 1 mg/dL (ref 0.3–1.2)
Total Protein: 7.8 g/dL (ref 6.5–8.1)

## 2017-05-12 LAB — URINALYSIS, ROUTINE W REFLEX MICROSCOPIC
Bilirubin Urine: NEGATIVE
Glucose, UA: NEGATIVE mg/dL
Hgb urine dipstick: NEGATIVE
Ketones, ur: NEGATIVE mg/dL
Leukocytes, UA: NEGATIVE
Nitrite: NEGATIVE
Protein, ur: NEGATIVE mg/dL
Specific Gravity, Urine: 1.002 — ABNORMAL LOW (ref 1.005–1.030)
pH: 6 (ref 5.0–8.0)

## 2017-05-12 LAB — CBC
HCT: 44.7 % (ref 36.0–46.0)
Hemoglobin: 14.7 g/dL (ref 12.0–15.0)
MCH: 30.3 pg (ref 26.0–34.0)
MCHC: 32.9 g/dL (ref 30.0–36.0)
MCV: 92.2 fL (ref 78.0–100.0)
Platelets: 277 10*3/uL (ref 150–400)
RBC: 4.85 MIL/uL (ref 3.87–5.11)
RDW: 12.6 % (ref 11.5–15.5)
WBC: 8 10*3/uL (ref 4.0–10.5)

## 2017-05-12 LAB — CBG MONITORING, ED: Glucose-Capillary: 65 mg/dL (ref 65–99)

## 2017-05-12 LAB — LIPASE, BLOOD: Lipase: 38 U/L (ref 11–51)

## 2017-05-12 NOTE — ED Triage Notes (Signed)
PT is here with left lower quad pain and was recently here with diverticulitis.  Pt also suffers from IBS.

## 2017-06-10 ENCOUNTER — Encounter (HOSPITAL_COMMUNITY): Payer: Self-pay | Admitting: Emergency Medicine

## 2017-06-10 DIAGNOSIS — Z7982 Long term (current) use of aspirin: Secondary | ICD-10-CM | POA: Insufficient documentation

## 2017-06-10 DIAGNOSIS — R300 Dysuria: Secondary | ICD-10-CM | POA: Diagnosis not present

## 2017-06-10 DIAGNOSIS — J449 Chronic obstructive pulmonary disease, unspecified: Secondary | ICD-10-CM | POA: Diagnosis not present

## 2017-06-10 DIAGNOSIS — N3010 Interstitial cystitis (chronic) without hematuria: Secondary | ICD-10-CM | POA: Diagnosis not present

## 2017-06-10 DIAGNOSIS — J45909 Unspecified asthma, uncomplicated: Secondary | ICD-10-CM | POA: Insufficient documentation

## 2017-06-10 DIAGNOSIS — Z79899 Other long term (current) drug therapy: Secondary | ICD-10-CM | POA: Diagnosis not present

## 2017-06-10 DIAGNOSIS — R309 Painful micturition, unspecified: Secondary | ICD-10-CM | POA: Diagnosis present

## 2017-06-10 LAB — URINALYSIS, ROUTINE W REFLEX MICROSCOPIC
Bilirubin Urine: NEGATIVE
Glucose, UA: NEGATIVE mg/dL
Hgb urine dipstick: NEGATIVE
Ketones, ur: NEGATIVE mg/dL
Leukocytes, UA: NEGATIVE
Nitrite: NEGATIVE
Protein, ur: NEGATIVE mg/dL
Specific Gravity, Urine: 1.004 — ABNORMAL LOW (ref 1.005–1.030)
pH: 6 (ref 5.0–8.0)

## 2017-06-10 LAB — CBC
HCT: 43 % (ref 36.0–46.0)
Hemoglobin: 13.9 g/dL (ref 12.0–15.0)
MCH: 30.6 pg (ref 26.0–34.0)
MCHC: 32.3 g/dL (ref 30.0–36.0)
MCV: 94.7 fL (ref 78.0–100.0)
Platelets: 258 10*3/uL (ref 150–400)
RBC: 4.54 MIL/uL (ref 3.87–5.11)
RDW: 13 % (ref 11.5–15.5)
WBC: 9.9 10*3/uL (ref 4.0–10.5)

## 2017-06-10 LAB — BASIC METABOLIC PANEL
Anion gap: 8 (ref 5–15)
BUN: 7 mg/dL (ref 6–20)
CO2: 25 mmol/L (ref 22–32)
Calcium: 8.7 mg/dL — ABNORMAL LOW (ref 8.9–10.3)
Chloride: 105 mmol/L (ref 101–111)
Creatinine, Ser: 0.55 mg/dL (ref 0.44–1.00)
GFR calc Af Amer: >60 mL/min (ref >60)
GFR calc non Af Amer: >60 mL/min (ref >60)
Glucose, Bld: 94 mg/dL (ref 65–99)
Potassium: 3.3 mmol/L — ABNORMAL LOW (ref 3.5–5.1)
Sodium: 138 mmol/L (ref 135–145)

## 2017-06-10 NOTE — ED Triage Notes (Signed)
Reports right flank pain since Thursday.  Also c/o urinary pain with foul urine per patient.  Reports taking doxycycline at home as well as azo standard.  Afebrile but having chills and sweats intermittantly.

## 2017-06-11 ENCOUNTER — Emergency Department (HOSPITAL_COMMUNITY): Payer: Medicare Other

## 2017-06-11 ENCOUNTER — Emergency Department (HOSPITAL_COMMUNITY)
Admission: EM | Admit: 2017-06-11 | Discharge: 2017-06-11 | Disposition: A | Discharge status: Home / Self Care | Payer: Medicare Other | Attending: Emergency Medicine | Admitting: Emergency Medicine

## 2017-06-11 DIAGNOSIS — R300 Dysuria: Secondary | ICD-10-CM

## 2017-06-11 MED ORDER — DOXYCYCLINE HYCLATE 100 MG PO CAPS: 100.0000 mg | ORAL_CAPSULE | Freq: Two times a day (BID) | ORAL | 0 refills | Status: AC

## 2017-06-11 MED ORDER — KETOROLAC TROMETHAMINE 30 MG/ML IJ SOLN
30.0000 mg | Freq: Once | INTRAMUSCULAR | Status: AC
  Administered 2017-06-11: 30 mg via INTRAMUSCULAR
  Filled 2017-06-11: qty 1

## 2017-06-11 NOTE — ED Provider Notes (Signed)
[facility]-EMERGENCY DEPT Provider Note   CSN: [id number] Arrival date & time: 06/10/17  2111     History   Chief Complaint Chief Complaint  Patient presents with  . pain with urination  . Flank Pain    HPI Sandra Burns is a 61 y.o. female.  Patient reports symptoms of painful urination, "like razor blades", 4 days ago associated with right flank pain. No fever, nausea or vomiting. She reports a history of IC and used one of her UA quick strips without any evidence of WBCs. She has noticed a foul odor to urine. She started taking a prescription for Doxycycline she had from a previous infection and has had 4 doses. She has tried increasing her water intake, using cranberry juice, AZO standard (with temporary relief) and she continues to have symptoms.    The history is provided by the patient. No language interpreter was used.    Past Medical History:  Diagnosis Date  . Arrhythmia   . Arthritis   . Asthma   . Back pain   . Chronic obstructive airway disease (HCC)   . Diverticulosis   . Fibromyalgia   . Fibromyalgia   . GERD (gastroesophageal reflux disease)   . Irritable bowel syndrome   . Kidney stone   . Lupus    "suspected"  . Migraine   . Mitral valve prolapse   . Osteoporosis   . Paroxysmal A-fib (HCC)   . Psoriatic arthritis (HCC)   . Raynaud's disease   . Reactive airway disease   . S/P chemotherapy, time since greater than 12 weeks 1984   cervical ca    There are no active problems to display for this patient.   Past Surgical History:  Procedure Laterality Date  . ABDOMINAL HYSTERECTOMY    . BACK SURGERY    . BREAST BIOPSY Left 20+ YRS AGO   EXCISIONAL - NEG  . CHOLECYSTECTOMY    . OOPHORECTOMY      OB History    No data available       Home Medications    Prior to Admission medications   Medication Sig Start Date End Date Taking? Authorizing Provider  ALPRAZolam Prudy Feeler) 0.5 MG tablet Take 0.5 mg by mouth See admin instructions. Pt  takes 0.75mg  in morning, afternoon, 0.5mg  with dinner, and 0.75mg  at bedtime    [provider]  Artificial Tear Ointment (REFRESH P.M. OP) Place 1 application into both eyes at bedtime. Apply eye gel to both eyelids    [provider]  aspirin 81 MG chewable tablet Chew 81 mg by mouth daily.    [provider]  atenolol (TENORMIN) 25 MG tablet Take 12.5 mg by mouth 2 (two) times daily.     [provider]  budesonide (PULMICORT) 180 MCG/ACT inhaler Inhale 2 puffs into the lungs 2 (two) times daily.     [provider]  ipratropium (ATROVENT HFA) 17 MCG/ACT inhaler Inhale 2 puffs into the lungs 4 (four) times daily.    [provider]  Multiple Vitamin (MULTIVITAMIN WITH MINERALS) TABS Take 1 tablet by mouth daily.    [provider]  pantoprazole (PROTONIX) 40 MG tablet Take 40 mg by mouth daily as needed. For stomach    [provider]  Polyvinyl Alcohol-Povidone (REFRESH OP) Place 1 drop into both eyes daily as needed (dry eyes).    [provider]    Family History Family History  Problem Relation Age of Onset  . Breast cancer Neg  Hx     Social History Social History  Substance Use Topics  . Smoking status: Never Smoker  . Smokeless tobacco: Never Used  . Alcohol use No     Allergies   Albuterol; Amoxicillin; Cephalosporins; Ciprofloxacin; Flagyl [metronidazole]; Macrobid [nitrofurantoin macrocrystal]; Sulfa antibiotics; Vancomycin; Vicodin [hydrocodone-acetaminophen]; Zithromax [azithromycin]; Decongest-aid [pseudoephedrine]; Levofloxacin; and Prednisone   Review of Systems Review of Systems  Constitutional: Negative for chills and fever.  Respiratory: Negative.  Negative for shortness of breath.   Cardiovascular: Negative.  Negative for chest pain.  Gastrointestinal: Negative.  Negative for nausea and vomiting.  Genitourinary: Positive for dysuria, flank pain and frequency.       Reports  malodor.  Musculoskeletal: Negative for myalgias.  Skin: Negative.   Neurological: Negative.  Negative for weakness and light-headedness.     Physical Exam Updated Vital Signs BP (!) 145/81 (BP Location: Right Arm)   Pulse (!) 57   Temp 98.1 F (36.7 C) (Oral)   Resp 10   Ht  (1.6 m)   Wt 49 kg (108 lb)   SpO2 100%   BMI 19.13 kg/m   Physical Exam  Constitutional: She is oriented to person, place, and time. She appears well-developed and well-nourished.  HENT:  Head: Normocephalic.  Neck: Normal range of motion. Neck supple.  Cardiovascular: Normal rate and regular rhythm.   Pulmonary/Chest: Effort normal and breath sounds normal. She has no wheezes. She has no rales.  Abdominal: Soft. Bowel sounds are normal. There is tenderness. There is no rebound and no guarding.    Genitourinary:  Genitourinary Comments: Mild right flank tenderness.   Musculoskeletal: Normal range of motion.  Neurological: She is alert and oriented to person, place, and time.  Skin: Skin is warm and dry. No rash noted.  Psychiatric: She has a normal mood and affect.     ED Treatments / Results  Labs (all labs ordered are listed, but only abnormal results are displayed) Labs Reviewed  URINALYSIS, ROUTINE W REFLEX MICROSCOPIC - Abnormal; Notable for the following:       Result Value   Color, Urine STRAW (*)    Specific Gravity, Urine 1.004 (*)    All other components within normal limits  BASIC METABOLIC PANEL - Abnormal; Notable for the following:    Potassium 3.3 (*)    Calcium 8.7 (*)    All other components within normal limits  CBC    EKG  EKG Interpretation None       Radiology No results found.  Procedures Procedures (including critical care time)  Medications Ordered in ED Medications  ketorolac (TORADOL) 30 MG/ML injection 30 mg (not administered)     Initial Impression / Assessment and Plan / ED Course  I have reviewed the triage vital signs and the nursing  notes.  Pertinent labs & imaging results that were available during my care of the patient were reviewed by me and considered in my medical decision making (see chart for details).     Patient having pain with urination, difficulty urinating and malodor x 3 days. She started Doxycycline that she had at home 2 days ago and sxs improved then declined again today. No fever. History of IC.  She reports a history of kidney stones as well. CT renal study done showing no ureteral stones (there is nephrolithiasis). There is confirmation of a normal appendix.   DDx: UTI partially treated vs interstitial cystitis pain vs passed stone.   She should continue to take Doxycycline for another  3 days, total of 5 days of treatment. She declined pain medication here but accepted an IM Toradol injection. Small hematoma at injection site considered benign. Ice pack applied.   Final Clinical Impressions(s) / ED Diagnoses   Final diagnoses:  None   1. Dysuria 2. History of Interstitial Cystitis  New Prescriptions New Prescriptions   No medications on file     Danne Harbor 06/11/17 0241    Dione Booze, MD 06/11/17 [phone]

## 2017-06-11 NOTE — Discharge Instructions (Signed)
Take the Doxycycline for a total of 5 days. Return to the emergency department if symptoms worsen - high fever, severe pain, uncontrolled vomiting or new concern.

## 2017-06-11 NOTE — ED Notes (Signed)
Patient transported to CT 

## 2017-06-11 NOTE — ED Notes (Signed)
Pt understood dc material, NAD noted. Scripts given at [facility]

## 2017-10-27 DIAGNOSIS — K644 Residual hemorrhoidal skin tags: Secondary | ICD-10-CM | POA: Diagnosis not present

## 2017-11-03 ENCOUNTER — Telehealth: Payer: Self-pay | Admitting: Internal Medicine

## 2017-11-03 NOTE — Telephone Encounter (Signed)
Copied from CRM [phone]#[id number]. Topic: Quick Communication - See Telephone Encounter >> Nov 03, 2017  2:36 PM Rudi CocoLathan, Zay Yeargan M, NT wrote: CRM for notification. See Telephone encounter for:   11/03/17. Pt. Would like to become new pt. Of Dr. Lorin PicketScott pt. Would like for someone to give her a call if able to approve. ([facility])

## 2017-11-03 NOTE — Telephone Encounter (Signed)
Lm to let Ms. Eternity know that Dr.Scott is NOT taking on New Pt

## 2017-11-09 ENCOUNTER — Emergency Department (HOSPITAL_COMMUNITY)
Admission: EM | Admit: 2017-11-09 | Discharge: 2017-11-09 | Disposition: A | Discharge status: Home / Self Care | Payer: Medicare HMO | Attending: Emergency Medicine | Admitting: Emergency Medicine

## 2017-11-09 ENCOUNTER — Emergency Department (HOSPITAL_COMMUNITY): Payer: Medicare HMO

## 2017-11-09 ENCOUNTER — Encounter (HOSPITAL_COMMUNITY): Payer: Self-pay

## 2017-11-09 DIAGNOSIS — J449 Chronic obstructive pulmonary disease, unspecified: Secondary | ICD-10-CM | POA: Diagnosis not present

## 2017-11-09 DIAGNOSIS — R1032 Left lower quadrant pain: Secondary | ICD-10-CM | POA: Diagnosis not present

## 2017-11-09 DIAGNOSIS — R110 Nausea: Secondary | ICD-10-CM | POA: Diagnosis not present

## 2017-11-09 DIAGNOSIS — R109 Unspecified abdominal pain: Secondary | ICD-10-CM

## 2017-11-09 DIAGNOSIS — Z79899 Other long term (current) drug therapy: Secondary | ICD-10-CM | POA: Insufficient documentation

## 2017-11-09 DIAGNOSIS — J45909 Unspecified asthma, uncomplicated: Secondary | ICD-10-CM | POA: Insufficient documentation

## 2017-11-09 DIAGNOSIS — R100 Acute abdomen: Secondary | ICD-10-CM | POA: Diagnosis not present

## 2017-11-09 DIAGNOSIS — Z7982 Long term (current) use of aspirin: Secondary | ICD-10-CM | POA: Insufficient documentation

## 2017-11-09 LAB — COMPREHENSIVE METABOLIC PANEL
ALT: 14 U/L (ref 14–54)
AST: 28 U/L (ref 15–41)
Albumin: 3.7 g/dL (ref 3.5–5.0)
Alkaline Phosphatase: 78 U/L (ref 38–126)
Anion gap: 8 (ref 5–15)
BUN: <5 mg/dL — ABNORMAL LOW (ref 6–20)
CO2: 27 mmol/L (ref 22–32)
Calcium: 9 mg/dL (ref 8.9–10.3)
Chloride: 103 mmol/L (ref 101–111)
Creatinine, Ser: 0.69 mg/dL (ref 0.44–1.00)
GFR calc Af Amer: >60 mL/min (ref >60)
GFR calc non Af Amer: >60 mL/min (ref >60)
Glucose, Bld: 93 mg/dL (ref 65–99)
Potassium: 3.9 mmol/L (ref 3.5–5.1)
Sodium: 138 mmol/L (ref 135–145)
Total Bilirubin: 0.8 mg/dL (ref 0.3–1.2)
Total Protein: 7.2 g/dL (ref 6.5–8.1)

## 2017-11-09 LAB — CBC
HCT: 46 % (ref 36.0–46.0)
Hemoglobin: 15.2 g/dL — ABNORMAL HIGH (ref 12.0–15.0)
MCH: 31 pg (ref 26.0–34.0)
MCHC: 33 g/dL (ref 30.0–36.0)
MCV: 93.7 fL (ref 78.0–100.0)
Platelets: 272 10*3/uL (ref 150–400)
RBC: 4.91 MIL/uL (ref 3.87–5.11)
RDW: 12.8 % (ref 11.5–15.5)
WBC: 7.9 10*3/uL (ref 4.0–10.5)

## 2017-11-09 LAB — URINALYSIS, ROUTINE W REFLEX MICROSCOPIC
Bilirubin Urine: NEGATIVE
Glucose, UA: NEGATIVE mg/dL
Hgb urine dipstick: NEGATIVE
Ketones, ur: NEGATIVE mg/dL
Leukocytes, UA: NEGATIVE
Nitrite: NEGATIVE
Protein, ur: NEGATIVE mg/dL
Specific Gravity, Urine: 1.002 — ABNORMAL LOW (ref 1.005–1.030)
pH: 6 (ref 5.0–8.0)

## 2017-11-09 LAB — LIPASE, BLOOD: Lipase: 37 U/L (ref 11–51)

## 2017-11-09 LAB — I-STAT CG4 LACTIC ACID, ED: Lactic Acid, Venous: 1.83 mmol/L (ref 0.5–1.9)

## 2017-11-09 MED ORDER — IOPAMIDOL (ISOVUE-300) INJECTION 61%
INTRAVENOUS | Status: AC
  Filled 2017-11-09: qty 100

## 2017-11-09 MED ORDER — DICYCLOMINE HCL 20 MG PO TABS: 20.0000 mg | ORAL_TABLET | Freq: Two times a day (BID) | ORAL | 0 refills | Status: AC

## 2017-11-09 NOTE — Discharge Instructions (Signed)
Take the medications as needed for pain and cramping, follow-up with your primary care doctor next week

## 2017-11-09 NOTE — ED Provider Notes (Signed)
[facility] EMERGENCY DEPARTMENT Provider Note   CSN: [id number] Arrival date & time: 11/09/17  1150     History   Chief Complaint Chief Complaint  Patient presents with  . Abdominal Pain    HPI Sandra Burns is a 62 y.o. female.  HPI   Sandra Burns is a 62 y.o. female, with a history of asthma, fibromyalgia, GERD, IBS, psoriatic arthritis, presenting to the ED with abdominal pain for the last two days.  Pain is LLQ, currently 9/10, sharp, nonradiating. Started as soreness two days ago. After breakfast yesterday, had a BM that was hard then another that was normal, then pain significantly increased. Started herself on a liquid diet from there. Accompanied by nausea.  States she was seen by her PCP this morning and he was concerned for diverticulitis vs abscess. She adds that her last bout of diverticulitis was treated with doxycycline BID and a liquid diet for 10 days.  Denies fever/chills, hematochezia/melena, hematuria/dysuria, abnormal vaginal discharge, vaginal bleeding, or any other complaints.   Patient requests no narcotic analgesics and declines other analgesics upon my initial interview.   Past Medical History:  Diagnosis Date  . Arrhythmia   . Arthritis   . Asthma   . Back pain   . Chronic obstructive airway disease (HCC)   . Diverticulosis   . Fibromyalgia   . Fibromyalgia   . GERD (gastroesophageal reflux disease)   . Irritable bowel syndrome   . Kidney stone   . Lupus    "suspected"  . Migraine   . Mitral valve prolapse   . Osteoporosis   . Paroxysmal A-fib (HCC)   . Psoriatic arthritis (HCC)   . Raynaud's disease   . Reactive airway disease   . S/P chemotherapy, time since greater than 12 weeks 1984   cervical ca    There are no active problems to display for this patient.   Past Surgical History:  Procedure Laterality Date  . ABDOMINAL HYSTERECTOMY    . BACK SURGERY    . BREAST BIOPSY Left 20+ YRS AGO   EXCISIONAL - NEG  . CHOLECYSTECTOMY    . OOPHORECTOMY      OB History    No data available       Home Medications    Prior to Admission medications   Medication Sig Start Date End Date Taking? Authorizing Provider  ALPRAZolam Prudy Feeler) 0.5 MG tablet Take 0.5 mg by mouth See admin instructions. Pt takes 0.75mg  in morning, afternoon, 0.5mg  with dinner, and 0.75mg  at bedtime    [provider]  Artificial Tear Ointment (REFRESH P.M. OP) Place 1 application into both eyes at bedtime. Apply eye gel to both eyelids    [provider]  aspirin 81 MG chewable tablet Chew 81 mg by mouth daily.    [provider]  atenolol (TENORMIN) 25 MG tablet Take 12.5 mg by mouth 2 (two) times daily.     [provider]  budesonide (PULMICORT) 180 MCG/ACT inhaler Inhale 2 puffs into the lungs 2 (two) times daily.     [provider]  ipratropium (ATROVENT HFA) 17 MCG/ACT inhaler Inhale 2 puffs into the lungs 4 (four) times daily.    [provider]  Multiple Vitamin (MULTIVITAMIN WITH MINERALS) TABS Take 1 tablet by mouth daily.    [provider]  pantoprazole (PROTONIX) 40 MG tablet Take 40 mg by mouth daily as needed. For stomach    [provider]  Polyvinyl Alcohol-Povidone (  REFRESH OP) Place 1 drop into both eyes daily as needed (dry eyes).    [provider]    Family History Family History  Problem Relation Age of Onset  . Breast cancer Neg Hx     Social History Social History   Tobacco Use  . Smoking status: Never Smoker  . Smokeless tobacco: Never Used  Substance Use Topics  . Alcohol use: No  . Drug use: No     Allergies   Albuterol; Amoxicillin; Cephalosporins; Ciprofloxacin; Flagyl [metronidazole]; Macrobid [nitrofurantoin macrocrystal]; Sulfa antibiotics; Vancomycin; Vicodin [hydrocodone-acetaminophen]; Zithromax [azithromycin]; Decongest-aid [pseudoephedrine]; Levofloxacin; and Prednisone   Review  of Systems Review of Systems  Constitutional: Negative for chills, diaphoresis and fever.  Respiratory: Negative for shortness of breath.   Cardiovascular: Negative for chest pain.  Gastrointestinal: Positive for abdominal pain and nausea. Negative for diarrhea and vomiting.  Genitourinary: Negative for dysuria, hematuria, vaginal bleeding and vaginal discharge.  All other systems reviewed and are negative.    Physical Exam Updated Vital Signs BP (!) 141/72 (BP Location: Right Arm)   Pulse 65   Temp 98.3 F (36.8 C) (Oral)   Resp 16   Wt 49 kg (108 lb)   SpO2 100%   BMI 19.13 kg/m   Physical Exam  Constitutional: She appears well-developed and well-nourished. No distress.  HENT:  Head: Normocephalic and atraumatic.  Eyes: Conjunctivae are normal.  Neck: Neck supple.  Cardiovascular: Normal rate, regular rhythm, normal heart sounds and intact distal pulses.  Pulmonary/Chest: Effort normal and breath sounds normal. No respiratory distress.  Abdominal: Soft. There is tenderness in the left lower quadrant. There is rebound. There is no guarding.  Palpation at the RLQ and periumbilical regions exacerbate pain in the LLQ.  Musculoskeletal: She exhibits no edema.  Lymphadenopathy:    She has no cervical adenopathy.  Neurological: She is alert.  Skin: Skin is warm and dry. She is not diaphoretic.  Psychiatric: She has a normal mood and affect. Her behavior is normal.  Nursing note and vitals reviewed.    ED Treatments / Results  Labs (all labs ordered are listed, but only abnormal results are displayed) Labs Reviewed  COMPREHENSIVE METABOLIC PANEL - Abnormal; Notable for the following components:      Result Value   BUN <5 (*)    All other components within normal limits  CBC - Abnormal; Notable for the following components:   Hemoglobin 15.2 (*)    All other components within normal limits  URINALYSIS, ROUTINE W REFLEX MICROSCOPIC - Abnormal; Notable for the following  components:   Color, Urine STRAW (*)    Specific Gravity, Urine 1.002 (*)    All other components within normal limits  LIPASE, BLOOD  I-STAT CG4 LACTIC ACID, ED    EKG  EKG Interpretation None       Radiology No results found.  Procedures Procedures (including critical care time)  Medications Ordered in ED Medications - No data to display   Initial Impression / Assessment and Plan / ED Course  I have reviewed the triage vital signs and the nursing notes.  Pertinent labs & imaging results that were available during my care of the patient were reviewed by me and considered in my medical decision making (see chart for details).     Patient presents with worsening abdominal pain for the past couple days. Patient is nontoxic appearing, afebrile, not tachycardic, not tachypneic, not hypotensive, and maintains SPO2 of 100% on room air.  No leukocytosis.  Other  lab results equally encouraging, however, patient's exam gives concern for possible intra-abdominal process.  Dr. Lynelle Doctor to continue care for this patient at the end of my shift. Plan: CT abdomen pending. Expect patient to discharge home if normal.     Vitals:   11/09/17 1212 11/09/17 1331 11/09/17 1500 11/09/17 1530  BP:  (!) 141/72 (!) 149/82 140/83  Pulse:  65 62 (!) 59  Resp:  16    Temp:      TempSrc:      SpO2:  100% 100% 100%  Weight: 49 kg (108 lb)        Final Clinical Impressions(s) / ED Diagnoses   Final diagnoses:  None    ED Discharge Orders    None       Concepcion Living 11/09/17 1620    Linwood Dibbles, MD 11/10/17 [phone]

## 2017-11-09 NOTE — ED Triage Notes (Signed)
Pt sent from pcp for evaluation of possible perforation or abscess. PT has hx of diverticulitis. Endorses full liquid diet yesterday with no relief. Pt endorses LLQ. Endorses nausea. Denies vomiting, diarrhea.

## 2017-11-09 NOTE — ED Notes (Signed)
Patient transported to CT 

## 2017-11-11 DIAGNOSIS — I808 Phlebitis and thrombophlebitis of other sites: Secondary | ICD-10-CM | POA: Diagnosis not present

## 2017-12-02 DIAGNOSIS — J3489 Other specified disorders of nose and nasal sinuses: Secondary | ICD-10-CM | POA: Diagnosis not present

## 2017-12-18 DIAGNOSIS — J301 Allergic rhinitis due to pollen: Secondary | ICD-10-CM | POA: Diagnosis not present

## 2017-12-21 DIAGNOSIS — R69 Illness, unspecified: Secondary | ICD-10-CM | POA: Diagnosis not present

## 2017-12-26 DIAGNOSIS — M5090 Cervical disc disorder, unspecified, unspecified cervical region: Secondary | ICD-10-CM | POA: Diagnosis not present

## 2017-12-26 DIAGNOSIS — J4540 Moderate persistent asthma, uncomplicated: Secondary | ICD-10-CM | POA: Diagnosis not present

## 2017-12-26 DIAGNOSIS — M797 Fibromyalgia: Secondary | ICD-10-CM | POA: Diagnosis not present

## 2017-12-26 DIAGNOSIS — Z79899 Other long term (current) drug therapy: Secondary | ICD-10-CM | POA: Diagnosis not present

## 2017-12-26 DIAGNOSIS — K219 Gastro-esophageal reflux disease without esophagitis: Secondary | ICD-10-CM | POA: Diagnosis not present

## 2017-12-26 DIAGNOSIS — R200 Anesthesia of skin: Secondary | ICD-10-CM | POA: Diagnosis not present

## 2017-12-26 DIAGNOSIS — I471 Supraventricular tachycardia: Secondary | ICD-10-CM | POA: Diagnosis not present

## 2017-12-26 DIAGNOSIS — R69 Illness, unspecified: Secondary | ICD-10-CM | POA: Diagnosis not present

## 2017-12-26 DIAGNOSIS — L409 Psoriasis, unspecified: Secondary | ICD-10-CM | POA: Diagnosis not present

## 2017-12-29 DIAGNOSIS — L720 Epidermal cyst: Secondary | ICD-10-CM | POA: Diagnosis not present

## 2017-12-29 DIAGNOSIS — L408 Other psoriasis: Secondary | ICD-10-CM | POA: Diagnosis not present

## 2018-01-01 DIAGNOSIS — K582 Mixed irritable bowel syndrome: Secondary | ICD-10-CM | POA: Diagnosis not present

## 2018-01-01 DIAGNOSIS — K219 Gastro-esophageal reflux disease without esophagitis: Secondary | ICD-10-CM | POA: Diagnosis not present

## 2018-01-01 DIAGNOSIS — M3500 Sicca syndrome, unspecified: Secondary | ICD-10-CM | POA: Diagnosis not present

## 2018-01-02 DIAGNOSIS — R69 Illness, unspecified: Secondary | ICD-10-CM | POA: Diagnosis not present

## 2018-01-04 DIAGNOSIS — J029 Acute pharyngitis, unspecified: Secondary | ICD-10-CM | POA: Diagnosis not present

## 2018-01-05 DIAGNOSIS — J029 Acute pharyngitis, unspecified: Secondary | ICD-10-CM | POA: Diagnosis not present

## 2018-01-05 DIAGNOSIS — R05 Cough: Secondary | ICD-10-CM | POA: Diagnosis not present

## 2018-01-05 DIAGNOSIS — R0981 Nasal congestion: Secondary | ICD-10-CM | POA: Diagnosis not present

## 2018-01-09 DIAGNOSIS — J019 Acute sinusitis, unspecified: Secondary | ICD-10-CM | POA: Diagnosis not present

## 2018-01-11 DIAGNOSIS — J019 Acute sinusitis, unspecified: Secondary | ICD-10-CM | POA: Diagnosis not present

## 2018-01-11 DIAGNOSIS — J301 Allergic rhinitis due to pollen: Secondary | ICD-10-CM | POA: Diagnosis not present

## 2018-01-18 DIAGNOSIS — I480 Paroxysmal atrial fibrillation: Secondary | ICD-10-CM | POA: Diagnosis not present

## 2018-01-18 DIAGNOSIS — Z1211 Encounter for screening for malignant neoplasm of colon: Secondary | ICD-10-CM | POA: Diagnosis not present

## 2018-01-18 DIAGNOSIS — I341 Nonrheumatic mitral (valve) prolapse: Secondary | ICD-10-CM | POA: Diagnosis not present

## 2018-01-18 DIAGNOSIS — I471 Supraventricular tachycardia: Secondary | ICD-10-CM | POA: Diagnosis not present

## 2018-01-18 DIAGNOSIS — R0602 Shortness of breath: Secondary | ICD-10-CM | POA: Diagnosis not present

## 2018-01-23 DIAGNOSIS — R399 Unspecified symptoms and signs involving the genitourinary system: Secondary | ICD-10-CM | POA: Diagnosis not present

## 2018-01-27 DIAGNOSIS — H18831 Recurrent erosion of cornea, right eye: Secondary | ICD-10-CM | POA: Diagnosis not present

## 2018-01-29 ENCOUNTER — Encounter: Payer: Self-pay | Admitting: Cardiology

## 2018-01-29 DIAGNOSIS — I341 Nonrheumatic mitral (valve) prolapse: Secondary | ICD-10-CM | POA: Diagnosis not present

## 2018-01-29 DIAGNOSIS — R0602 Shortness of breath: Secondary | ICD-10-CM | POA: Diagnosis not present

## 2018-01-30 DIAGNOSIS — H18831 Recurrent erosion of cornea, right eye: Secondary | ICD-10-CM | POA: Diagnosis not present

## 2018-01-31 DIAGNOSIS — Z7689 Persons encountering health services in other specified circumstances: Secondary | ICD-10-CM | POA: Diagnosis not present

## 2018-01-31 DIAGNOSIS — M3500 Sicca syndrome, unspecified: Secondary | ICD-10-CM | POA: Diagnosis not present

## 2018-01-31 DIAGNOSIS — B0052 Herpesviral keratitis: Secondary | ICD-10-CM | POA: Diagnosis not present

## 2018-01-31 DIAGNOSIS — I341 Nonrheumatic mitral (valve) prolapse: Secondary | ICD-10-CM | POA: Diagnosis not present

## 2018-01-31 DIAGNOSIS — L4050 Arthropathic psoriasis, unspecified: Secondary | ICD-10-CM | POA: Diagnosis not present

## 2018-01-31 DIAGNOSIS — K582 Mixed irritable bowel syndrome: Secondary | ICD-10-CM | POA: Diagnosis not present

## 2018-01-31 DIAGNOSIS — J4540 Moderate persistent asthma, uncomplicated: Secondary | ICD-10-CM | POA: Diagnosis not present

## 2018-01-31 DIAGNOSIS — R69 Illness, unspecified: Secondary | ICD-10-CM | POA: Diagnosis not present

## 2018-01-31 DIAGNOSIS — J302 Other seasonal allergic rhinitis: Secondary | ICD-10-CM | POA: Diagnosis not present

## 2018-02-05 DIAGNOSIS — B0052 Herpesviral keratitis: Secondary | ICD-10-CM | POA: Diagnosis not present

## 2018-02-14 DIAGNOSIS — M3501 Sicca syndrome with keratoconjunctivitis: Secondary | ICD-10-CM | POA: Diagnosis not present

## 2018-03-04 DIAGNOSIS — M3501 Sicca syndrome with keratoconjunctivitis: Secondary | ICD-10-CM | POA: Diagnosis not present

## 2018-03-05 DIAGNOSIS — L400 Psoriasis vulgaris: Secondary | ICD-10-CM | POA: Diagnosis not present

## 2018-03-05 DIAGNOSIS — R21 Rash and other nonspecific skin eruption: Secondary | ICD-10-CM | POA: Diagnosis not present

## 2018-03-19 ENCOUNTER — Ambulatory Visit: Admit: 2018-03-19 | Payer: Medicare HMO | Admitting: Unknown Physician Specialty

## 2018-03-19 SURGERY — ESOPHAGOGASTRODUODENOSCOPY (EGD) WITH PROPOFOL
Anesthesia: General

## 2018-03-23 DIAGNOSIS — H168 Other keratitis: Secondary | ICD-10-CM | POA: Diagnosis not present

## 2018-03-28 ENCOUNTER — Ambulatory Visit: Payer: Medicare HMO | Admitting: Gastroenterology

## 2018-03-28 NOTE — Progress Notes (Deleted)
Gastroenterology Consultation  Referring Provider:     Dalia Heading, MD Primary Care Physician:  Orene Desanctis, MD Primary Gastroenterologist:  Dr. Servando Snare     Reason for Consultation:     Evaluation before having a colonoscopy        HPI:   Sandra Burns is a 62 y.o. y/o female referred for consultation & management of evaluation before having a colonoscopy by Dr. Orene Desanctis, MD.  This patient is a patient who had been seen by [facility] clinic back in 2010 and had contacted their office back in February for problems with her hemorrhoids.  The patient was told to call her primary care provider at that time and get a referral which would then directs her over to surgery.  She was not seen at that time by gastroenterology and she had canceled her appointment with them.  She is now being sent over for a screening colonoscopy by me.  Patient does have a history of COPD fibromyalgia GERD and irritable bowel syndrome.  Past Medical History:  Diagnosis Date  . Arrhythmia   . Arthritis   . Asthma   . Back pain   . Chronic obstructive airway disease (HCC)   . Diverticulosis   . Fibromyalgia   . Fibromyalgia   . GERD (gastroesophageal reflux disease)   . Irritable bowel syndrome   . Kidney stone   . Lupus    "suspected"  . Migraine   . Mitral valve prolapse   . Osteoporosis   . Paroxysmal A-fib (HCC)   . Psoriatic arthritis (HCC)   . Raynaud's disease   . Reactive airway disease   . S/P chemotherapy, time since greater than 12 weeks 1984   cervical ca    Past Surgical History:  Procedure Laterality Date  . ABDOMINAL HYSTERECTOMY    . BACK SURGERY    . BREAST BIOPSY Left 20+ YRS AGO   EXCISIONAL - NEG  . CHOLECYSTECTOMY    . OOPHORECTOMY      Prior to Admission medications   Medication Sig Start Date End Date Taking? Authorizing Provider  ALPRAZolam Prudy Feeler) 0.5 MG tablet Take 0.5 mg by mouth See admin instructions. Pt takes 0.75mg  in morning, afternoon, 0.5mg  with  dinner, and 0.75mg  at bedtime    [provider]  Artificial Tear Ointment (REFRESH P.M. OP) Place 1 application into both eyes at bedtime. Apply eye gel to both eyelids    [provider]  aspirin 81 MG chewable tablet Chew 81 mg by mouth daily.    [provider]  atenolol (TENORMIN) 25 MG tablet Take 12.5 mg by mouth 2 (two) times daily.     [provider]  budesonide (PULMICORT) 180 MCG/ACT inhaler Inhale 2 puffs into the lungs 2 (two) times daily.     [provider]  dicyclomine (BENTYL) 20 MG tablet Take 1 tablet (20 mg total) by mouth 2 (two) times daily. 11/09/17   Linwood Dibbles, MD  ipratropium (ATROVENT HFA) 17 MCG/ACT inhaler Inhale 2 puffs into the lungs 4 (four) times daily.    [provider]  Multiple Vitamin (MULTIVITAMIN WITH MINERALS) TABS Take 1 tablet by mouth daily.    [provider]  pantoprazole (PROTONIX) 40 MG tablet Take 40 mg by mouth daily as needed. For stomach    [provider]  Polyvinyl Alcohol-Povidone (REFRESH OP) Place 1 drop into both eyes daily as needed (dry eyes).    [provider]    [facility]  History  Problem Relation Age of Onset  . Breast cancer Neg Hx      Social History   Tobacco Use  . Smoking status: Never Smoker  . Smokeless tobacco: Never Used  Substance Use Topics  . Alcohol use: No  . Drug use: No    Allergies as of 03/28/2018 - Review Complete 11/09/2017  Allergen Reaction Noted  . Albuterol Other (See Comments) 03/31/2012  . Amoxicillin Itching and Swelling 03/31/2012  . Cephalosporins Itching and Rash 03/31/2012  . Ciprofloxacin Swelling 03/31/2012  . Flagyl [metronidazole] Dermatitis and Other (See Comments) 12/20/2013  . Macrobid [nitrofurantoin macrocrystal] Itching 03/31/2012  . Sulfa antibiotics Itching 03/31/2012  . Vancomycin Shortness Of Breath 03/31/2012  . Vicodin [hydrocodone-acetaminophen] Shortness Of Breath 03/31/2012  . Zithromax  [azithromycin] Other (See Comments) 04/12/2017  . Decongest-aid [pseudoephedrine] Palpitations 03/31/2012  . Levofloxacin Rash   . Prednisone Palpitations 03/31/2012    Review of Systems:    All systems reviewed and negative except where noted in HPI.   Physical Exam:  There were no vitals taken for this visit. No LMP recorded. Patient has had a hysterectomy. General:   Alert,  Well-developed, ***well-nourished, pleasant and cooperative in NAD Head:  Normocephalic and atraumatic. Eyes:  Sclera clear, no icterus.   Conjunctiva pink. Ears:  Normal auditory acuity. Nose:  No deformity, discharge, or lesions. Mouth:  No deformity or lesions,oropharynx pink & moist. Neck:  Supple; no masses or thyromegaly. Lungs:  Respirations even and unlabored.  Clear throughout to auscultation.   No wheezes, crackles, or rhonchi. No acute distress. Heart:  Regular rate and rhythm; no murmurs, clicks, rubs, or gallops. Abdomen:  Normal bowel sounds.  No bruits.  Soft, non-tender and non-distended without masses, hepatosplenomegaly or hernias noted.  No guarding or rebound tenderness.  ***Negative Carnett sign.   Rectal:  Deferred.***  Msk:  Symmetrical without gross deformities.  ***Good, equal movement & strength bilaterally. Pulses:  Normal pulses noted. Extremities:  No clubbing or edema.  No cyanosis. Neurologic:  Alert and oriented x3;  grossly normal neurologically. Skin:  Intact without significant lesions or rashes.  ***No jaundice. Lymph Nodes:  No significant cervical adenopathy. Psych:  Alert and cooperative. Normal mood and affect.  Imaging Studies: No results found.  Assessment and Plan:   Sandra Burns is a 62 y.o. y/o female ***  Midge Miniumarren Tenia Goh, MD. Clementeen GrahamFACG    Note: This dictation was prepared with Burns dictation along with smaller phrase technology. Any transcriptional errors that result from this process are unintentional.

## 2018-03-31 DIAGNOSIS — R300 Dysuria: Secondary | ICD-10-CM | POA: Diagnosis not present

## 2018-05-02 DIAGNOSIS — M3501 Sicca syndrome with keratoconjunctivitis: Secondary | ICD-10-CM | POA: Diagnosis not present

## 2018-05-10 DIAGNOSIS — M3500 Sicca syndrome, unspecified: Secondary | ICD-10-CM | POA: Diagnosis not present

## 2018-05-10 DIAGNOSIS — H0012 Chalazion right lower eyelid: Secondary | ICD-10-CM | POA: Diagnosis not present

## 2018-05-10 DIAGNOSIS — K582 Mixed irritable bowel syndrome: Secondary | ICD-10-CM | POA: Diagnosis not present

## 2018-05-10 DIAGNOSIS — J4540 Moderate persistent asthma, uncomplicated: Secondary | ICD-10-CM | POA: Diagnosis not present

## 2018-05-10 DIAGNOSIS — I341 Nonrheumatic mitral (valve) prolapse: Secondary | ICD-10-CM | POA: Diagnosis not present

## 2018-05-10 DIAGNOSIS — R69 Illness, unspecified: Secondary | ICD-10-CM | POA: Diagnosis not present

## 2018-05-10 DIAGNOSIS — J302 Other seasonal allergic rhinitis: Secondary | ICD-10-CM | POA: Diagnosis not present

## 2018-05-17 DIAGNOSIS — M797 Fibromyalgia: Secondary | ICD-10-CM | POA: Diagnosis not present

## 2018-05-17 DIAGNOSIS — R69 Illness, unspecified: Secondary | ICD-10-CM | POA: Diagnosis not present

## 2018-05-17 DIAGNOSIS — J45998 Other asthma: Secondary | ICD-10-CM | POA: Diagnosis not present

## 2018-05-17 DIAGNOSIS — E063 Autoimmune thyroiditis: Secondary | ICD-10-CM | POA: Diagnosis not present

## 2018-05-17 DIAGNOSIS — G8929 Other chronic pain: Secondary | ICD-10-CM | POA: Diagnosis not present

## 2018-05-17 DIAGNOSIS — K582 Mixed irritable bowel syndrome: Secondary | ICD-10-CM | POA: Diagnosis not present

## 2018-05-17 DIAGNOSIS — M5440 Lumbago with sciatica, unspecified side: Secondary | ICD-10-CM | POA: Diagnosis not present

## 2018-05-17 DIAGNOSIS — M3500 Sicca syndrome, unspecified: Secondary | ICD-10-CM | POA: Diagnosis not present

## 2018-05-17 DIAGNOSIS — G629 Polyneuropathy, unspecified: Secondary | ICD-10-CM | POA: Diagnosis not present

## 2018-05-17 DIAGNOSIS — I471 Supraventricular tachycardia: Secondary | ICD-10-CM | POA: Diagnosis not present

## 2018-05-18 DIAGNOSIS — H01003 Unspecified blepharitis right eye, unspecified eyelid: Secondary | ICD-10-CM | POA: Diagnosis not present

## 2018-05-23 DIAGNOSIS — J449 Chronic obstructive pulmonary disease, unspecified: Secondary | ICD-10-CM | POA: Insufficient documentation

## 2018-06-04 DIAGNOSIS — R69 Illness, unspecified: Secondary | ICD-10-CM | POA: Diagnosis not present

## 2018-06-11 DIAGNOSIS — J45909 Unspecified asthma, uncomplicated: Secondary | ICD-10-CM | POA: Diagnosis not present

## 2018-06-13 DIAGNOSIS — N3010 Interstitial cystitis (chronic) without hematuria: Secondary | ICD-10-CM | POA: Diagnosis not present

## 2018-06-13 DIAGNOSIS — R350 Frequency of micturition: Secondary | ICD-10-CM | POA: Diagnosis not present

## 2018-06-13 DIAGNOSIS — R300 Dysuria: Secondary | ICD-10-CM | POA: Diagnosis not present

## 2018-06-13 DIAGNOSIS — R8290 Unspecified abnormal findings in urine: Secondary | ICD-10-CM | POA: Diagnosis not present

## 2018-06-13 DIAGNOSIS — M545 Low back pain: Secondary | ICD-10-CM | POA: Diagnosis not present

## 2018-06-20 ENCOUNTER — Other Ambulatory Visit: Payer: Self-pay

## 2018-07-02 DIAGNOSIS — H11122 Conjunctival concretions, left eye: Secondary | ICD-10-CM | POA: Diagnosis not present

## 2018-07-04 DIAGNOSIS — R0602 Shortness of breath: Secondary | ICD-10-CM | POA: Diagnosis not present

## 2018-07-07 DIAGNOSIS — R69 Illness, unspecified: Secondary | ICD-10-CM | POA: Diagnosis not present

## 2018-07-09 ENCOUNTER — Encounter: Payer: Self-pay | Admitting: Emergency Medicine

## 2018-07-09 DIAGNOSIS — Z6820 Body mass index (BMI) 20.0-20.9, adult: Secondary | ICD-10-CM | POA: Diagnosis not present

## 2018-07-09 DIAGNOSIS — L4050 Arthropathic psoriasis, unspecified: Secondary | ICD-10-CM | POA: Insufficient documentation

## 2018-07-09 DIAGNOSIS — F4310 Post-traumatic stress disorder, unspecified: Secondary | ICD-10-CM | POA: Insufficient documentation

## 2018-07-09 DIAGNOSIS — G43919 Migraine, unspecified, intractable, without status migrainosus: Secondary | ICD-10-CM

## 2018-07-09 DIAGNOSIS — I499 Cardiac arrhythmia, unspecified: Secondary | ICD-10-CM | POA: Insufficient documentation

## 2018-07-09 DIAGNOSIS — I369 Nonrheumatic tricuspid valve disorder, unspecified: Secondary | ICD-10-CM

## 2018-07-09 DIAGNOSIS — L4059 Other psoriatic arthropathy: Secondary | ICD-10-CM | POA: Diagnosis not present

## 2018-07-09 DIAGNOSIS — R5383 Other fatigue: Secondary | ICD-10-CM | POA: Diagnosis not present

## 2018-07-09 DIAGNOSIS — G43909 Migraine, unspecified, not intractable, without status migrainosus: Secondary | ICD-10-CM | POA: Insufficient documentation

## 2018-07-09 DIAGNOSIS — Q228 Other congenital malformations of tricuspid valve: Secondary | ICD-10-CM | POA: Insufficient documentation

## 2018-07-09 DIAGNOSIS — I341 Nonrheumatic mitral (valve) prolapse: Secondary | ICD-10-CM | POA: Insufficient documentation

## 2018-07-09 DIAGNOSIS — J3489 Other specified disorders of nose and nasal sinuses: Secondary | ICD-10-CM | POA: Diagnosis not present

## 2018-07-09 DIAGNOSIS — K219 Gastro-esophageal reflux disease without esophagitis: Secondary | ICD-10-CM | POA: Insufficient documentation

## 2018-07-09 DIAGNOSIS — K589 Irritable bowel syndrome without diarrhea: Secondary | ICD-10-CM | POA: Insufficient documentation

## 2018-07-09 DIAGNOSIS — F411 Generalized anxiety disorder: Secondary | ICD-10-CM | POA: Insufficient documentation

## 2018-07-09 DIAGNOSIS — M2550 Pain in unspecified joint: Secondary | ICD-10-CM | POA: Diagnosis not present

## 2018-07-09 DIAGNOSIS — J45909 Unspecified asthma, uncomplicated: Secondary | ICD-10-CM | POA: Insufficient documentation

## 2018-07-13 ENCOUNTER — Encounter: Payer: Self-pay | Admitting: Physician Assistant

## 2018-07-13 ENCOUNTER — Ambulatory Visit: Payer: Medicare HMO | Admitting: Physician Assistant

## 2018-07-13 DIAGNOSIS — R69 Illness, unspecified: Secondary | ICD-10-CM | POA: Diagnosis not present

## 2018-07-13 DIAGNOSIS — F411 Generalized anxiety disorder: Secondary | ICD-10-CM | POA: Diagnosis not present

## 2018-07-13 DIAGNOSIS — F4310 Post-traumatic stress disorder, unspecified: Secondary | ICD-10-CM

## 2018-07-13 MED ORDER — ALPRAZOLAM 0.5 MG PO TABS: ORAL_TABLET | ORAL | 1 refills | Status: DC

## 2018-07-13 NOTE — Progress Notes (Signed)
[facility]  [name],  MRN: [id number]  PCP: Orene Desanctis, MD  Date of Evaluation: 07/13/2018 Time spent:15 minutes   HISTORY/CURRENT STATUS: HPI patient presents for follow-up for anxiety after her initial visit with me on 06/04/2018.  No new medications were added.  At that time, I continued the Xanax.  She has been on benzos for approximately 30 years and still to them for anxiety.  She eventually would like to wean off of them.  She has a lot of reasons to be anxious, she says, with her own health concerns of Sjogren's, cardiac issues, PTSD, history of a blood clot and other problems that are on the chart.  Also, her husband has been sick for the past month or so and will probably need lithotripsy so that has been stressful for her. The Xanax helps quite a bit.  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  We discussed SNRIs, that may be helpful for anxiety as well.  She stated she would think about it and do some reading before she decides to try that. She is seeing Mitzi Hansen in counseling and is considering changing therapist.  Past medications for mental health diagnoses include: Zoloft, BuSpar, Ativan.  Individual Medical History/ Review of Systems: Changes? :No  Allergies: Albuterol; Amoxicillin; Cephalosporins; Ciprofloxacin; Flagyl [metronidazole]; Macrobid [nitrofurantoin macrocrystal]; Sulfa antibiotics; Vancomycin; Vicodin [hydrocodone-acetaminophen]; Zithromax [azithromycin]; Decongest-aid [pseudoephedrine]; Levofloxacin; and Prednisone  Current Medications:  Current Outpatient Medications:  .  ALPRAZolam (XANAX) 0.5 MG tablet, 1.5 po q am, 1.5 qd at lunch, 1 po qd at dinner, 1.5 qhs all prn., Disp: 165 tablet, Rfl: 1 .  Artificial Tear Ointment (REFRESH P.M. OP), Place 1 application into both eyes at  bedtime. Apply eye gel to both eyelids, Disp: , Rfl:  .  aspirin 81 MG chewable tablet, Chew 81 mg by mouth daily., Disp: , Rfl:  .  atenolol (TENORMIN) 25 MG tablet, Take 12.5 mg by mouth 2 (two) times daily. , Disp: , Rfl:  .  budesonide (PULMICORT) 180 MCG/ACT inhaler, Inhale 2 puffs into the lungs 2 (two) times daily. , Disp: , Rfl:  .  dicyclomine (BENTYL) 20 MG tablet, Take 1 tablet (20 mg total) by mouth 2 (two) times daily., Disp: 20 tablet, Rfl: 0 .  ipratropium (ATROVENT HFA) 17 MCG/ACT inhaler, Inhale 2 puffs into the lungs 4 (four) times daily., Disp: , Rfl:  .  Multiple Vitamin (MULTIVITAMIN WITH MINERALS) TABS, Take 1 tablet by mouth daily., Disp: , Rfl:  .  pantoprazole (PROTONIX) 40 MG tablet, Take 40 mg by mouth daily as needed. For stomach, Disp: , Rfl:  .  Polyvinyl Alcohol-Povidone (REFRESH OP), Place 1 drop into both eyes daily as needed (dry eyes)., Disp: , Rfl:  Medication Side Effects: None  Family Medical/ Social History: Changes? No  MENTAL HEALTH EXAM:  There were no vitals taken for this visit.There is no height or weight on file to calculate BMI.  General Appearance: Well Groomed  Eye Contact:  Good  Speech:  Clear and Coherent  Volume:  Normal  Mood:  Euthymic  Affect:  Appropriate  Thought Process:  Goal Directed  Orientation:  Full (Time, Place, and Person)  Thought Content: Logical   Suicidal Thoughts:  No  Homicidal Thoughts:  No  Memory:  Immediate  Judgement:  Good  Insight:  Good  Psychomotor Activity:  Normal  Concentration:  Concentration: Good  Recall:  Good  Fund of Knowledge: Good  Language: Good  Akathisia:  No  AIMS (if indicated): not done  Assets:  Desire for Improvement  ADL's:  Intact  Cognition: WNL  Prognosis:  Fair    DIAGNOSES:    ICD-10-CM   1. Generalized anxiety disorder F41.1   2. PTSD (post-traumatic stress disorder) F43.10     RECOMMENDATIONS: I recommend trying an SNRI or another SSRI since she has only tried  Zoloft.  Other options could include an atypical antipsychotic.  She will look into the SNRI class and let me know if she would like to try that in the future.  In the meantime continue Xanax as noted above. Continue psychotherapy. Practice therapeutic lifestyle such as diet and exercise to help with anxiety. Return in 2 months or sooner as needed.   Melony Overly, PA-C

## 2018-07-16 ENCOUNTER — Ambulatory Visit: Payer: Self-pay | Admitting: Physician Assistant

## 2018-07-21 DIAGNOSIS — J029 Acute pharyngitis, unspecified: Secondary | ICD-10-CM | POA: Diagnosis not present

## 2018-07-21 DIAGNOSIS — R6889 Other general symptoms and signs: Secondary | ICD-10-CM | POA: Diagnosis not present

## 2018-07-26 DIAGNOSIS — B301 Conjunctivitis due to adenovirus: Secondary | ICD-10-CM | POA: Diagnosis not present

## 2018-07-30 ENCOUNTER — Ambulatory Visit: Payer: Self-pay | Admitting: Urology

## 2018-07-30 DIAGNOSIS — H01003 Unspecified blepharitis right eye, unspecified eyelid: Secondary | ICD-10-CM | POA: Diagnosis not present

## 2018-07-31 ENCOUNTER — Ambulatory Visit: Payer: Self-pay | Admitting: Urology

## 2018-08-02 DIAGNOSIS — H11221 Conjunctival granuloma, right eye: Secondary | ICD-10-CM | POA: Diagnosis not present

## 2018-08-09 DIAGNOSIS — H16001 Unspecified corneal ulcer, right eye: Secondary | ICD-10-CM | POA: Diagnosis not present

## 2018-08-10 DIAGNOSIS — B0052 Herpesviral keratitis: Secondary | ICD-10-CM | POA: Diagnosis not present

## 2018-08-13 DIAGNOSIS — B0052 Herpesviral keratitis: Secondary | ICD-10-CM | POA: Diagnosis not present

## 2018-08-15 ENCOUNTER — Other Ambulatory Visit: Payer: Self-pay

## 2018-08-15 ENCOUNTER — Telehealth: Payer: Self-pay | Admitting: Physician Assistant

## 2018-08-15 MED ORDER — ALPRAZOLAM 0.5 MG PO TABS: ORAL_TABLET | ORAL | 1 refills | Status: DC

## 2018-08-15 NOTE — Telephone Encounter (Signed)
Pt Called and said the manufacturer of the xanax is ion back order. Need to send a new script to [facility] in [place] [phone]. Has enough for tonight. Please do this ASAP. Please call pt and let her know [phone].

## 2018-08-16 ENCOUNTER — Other Ambulatory Visit: Payer: Self-pay

## 2018-08-16 ENCOUNTER — Ambulatory Visit: Payer: Medicare HMO | Admitting: Physician Assistant

## 2018-08-16 ENCOUNTER — Encounter: Payer: Self-pay | Admitting: Physician Assistant

## 2018-08-16 DIAGNOSIS — F411 Generalized anxiety disorder: Secondary | ICD-10-CM | POA: Diagnosis not present

## 2018-08-16 DIAGNOSIS — R69 Illness, unspecified: Secondary | ICD-10-CM | POA: Diagnosis not present

## 2018-08-16 DIAGNOSIS — F4310 Post-traumatic stress disorder, unspecified: Secondary | ICD-10-CM | POA: Diagnosis not present

## 2018-08-16 NOTE — Telephone Encounter (Signed)
Message from Pt.

## 2018-08-16 NOTE — Progress Notes (Signed)
[facility]  [name],  MRN: [id number]  PCP: Orene Desanctis, MD  Date of Evaluation: 08/16/2018 Time spent:15 minutes  Chief Complaint:   HISTORY/CURRENT STATUS: HPI Here for 4 week med check.   Feels that xanax is working well.  Is still under a lot of stress.  Husband has been sick still with kidney stones. 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 Other than, is doing ok.  There's been recall on a certain generic brand of Xanax.  She is had to go to a different pharmacy to get it filled.  The Xanax is still working however.  Sleeps pretty well.  Patient denies loss of interest in usual activities and is able to enjoy things.  Denies decreased energy or motivation.  Appetite has not changed.  No extreme sadness, tearfulness, or feelings of hopelessness.  Denies any changes in concentration, making decisions or remembering things.  Denies suicidal or homicidal thoughts.   Individual Medical History/ Review of Systems: Changes? :No   Allergies: Albuterol; Amoxicillin; Cephalosporins; Ciprofloxacin; Flagyl [metronidazole]; Macrobid [nitrofurantoin macrocrystal]; Sulfa antibiotics; Vancomycin; Vicodin [hydrocodone-acetaminophen]; Zithromax [azithromycin]; Decongest-aid [pseudoephedrine]; Levofloxacin; and Prednisone  Current Medications:  Current Outpatient Medications:  .  ALPRAZolam (XANAX) 0.5 MG tablet, 1.5 po q am, 1.5 qd at lunch, 1 po qd at dinner, 1.5 qhs all prn., Disp: 165 tablet, Rfl: 1 .  Artificial Tear Ointment (REFRESH P.M. OP), Place 1 application into both eyes at bedtime. Apply eye gel to both eyelids, Disp: , Rfl:  .  aspirin 81 MG chewable tablet, Chew 81 mg by mouth daily., Disp: , Rfl:  .  atenolol (TENORMIN) 25 MG tablet, Take 12.5 mg by mouth 2 (two) times daily. , Disp: , Rfl:  .  budesonide (PULMICORT) 180 MCG/ACT inhaler, Inhale 2 puffs into the lungs 2 (two) times daily. , Disp:  , Rfl:  .  dicyclomine (BENTYL) 20 MG tablet, Take 1 tablet (20 mg total) by mouth 2 (two) times daily., Disp: 20 tablet, Rfl: 0 .  ipratropium (ATROVENT HFA) 17 MCG/ACT inhaler, Inhale 2 puffs into the lungs 4 (four) times daily., Disp: , Rfl:  .  Multiple Vitamin (MULTIVITAMIN WITH MINERALS) TABS, Take 1 tablet by mouth daily., Disp: , Rfl:  .  pantoprazole (PROTONIX) 40 MG tablet, Take 40 mg by mouth daily as needed. For stomach, Disp: , Rfl:  .  Polyvinyl Alcohol-Povidone (REFRESH OP), Place 1 drop into both eyes daily as needed (dry eyes)., Disp: , Rfl:  Medication Side Effects: none  Family Medical/ Social History: Changes? No  MENTAL HEALTH EXAM:  There were no vitals taken for this visit.There is no height or weight on file to calculate BMI.  General Appearance: Casual  Eye Contact:  Good  Speech:  Clear and Coherent  Volume:  Normal  Mood:  Euthymic  Affect:  Appropriate  Thought Process:  Goal Directed  Orientation:  Full (Time, Place, and Person)  Thought Content: Logical   Suicidal Thoughts:  No  Homicidal Thoughts:  No  Memory:  WNL  Judgement:  Good  Insight:  Good  Psychomotor Activity:  Normal  Concentration:  Concentration: Good  Recall:  Good  Fund of Knowledge: Good  Language: Good  Assets:  Desire for Improvement  ADL's:  Intact  Cognition: WNL  Prognosis:  Good    DIAGNOSES:    ICD-10-CM   1. Generalized anxiety disorder F41.1   2. PTSD (post-traumatic stress  disorder) F43.10     Receiving Psychotherapy: Yes Mitzi Hansenobin Fitzgerald   RECOMMENDATIONS: I am aware of the shortage of Xanax 0.5 mg by a generic brand company [employer] [place], I believe it is.  What [facility] but I have left the [facility]ills that [facility].  Regardless, I will keep her on the current dose of Xanax, and she and I both hope that we can gradually wean her down over the next year or so.  For now, we are going to leave the  medication the same. Return in March 2020  Melony Overlyeresa Mandi Mattioli, [place]PA-C

## 2018-08-22 DIAGNOSIS — B0052 Herpesviral keratitis: Secondary | ICD-10-CM | POA: Diagnosis not present

## 2018-09-03 DIAGNOSIS — H18831 Recurrent erosion of cornea, right eye: Secondary | ICD-10-CM | POA: Diagnosis not present

## 2018-09-06 DIAGNOSIS — H11221 Conjunctival granuloma, right eye: Secondary | ICD-10-CM | POA: Diagnosis not present

## 2018-09-12 ENCOUNTER — Ambulatory Visit: Payer: Medicare HMO | Admitting: Physician Assistant

## 2018-09-16 ENCOUNTER — Emergency Department (HOSPITAL_COMMUNITY): Payer: Medicare HMO

## 2018-09-16 ENCOUNTER — Emergency Department (HOSPITAL_COMMUNITY)
Admission: EM | Admit: 2018-09-16 | Discharge: 2018-09-16 | Disposition: A | Discharge status: Home / Self Care | Payer: Medicare HMO | Attending: Emergency Medicine | Admitting: Emergency Medicine

## 2018-09-16 ENCOUNTER — Encounter (HOSPITAL_COMMUNITY): Payer: Self-pay

## 2018-09-16 ENCOUNTER — Other Ambulatory Visit: Payer: Self-pay

## 2018-09-16 DIAGNOSIS — R0602 Shortness of breath: Secondary | ICD-10-CM | POA: Diagnosis not present

## 2018-09-16 DIAGNOSIS — Z79899 Other long term (current) drug therapy: Secondary | ICD-10-CM | POA: Insufficient documentation

## 2018-09-16 DIAGNOSIS — R0789 Other chest pain: Secondary | ICD-10-CM

## 2018-09-16 DIAGNOSIS — R079 Chest pain, unspecified: Secondary | ICD-10-CM | POA: Diagnosis not present

## 2018-09-16 DIAGNOSIS — Z87891 Personal history of nicotine dependence: Secondary | ICD-10-CM | POA: Diagnosis not present

## 2018-09-16 DIAGNOSIS — Z7982 Long term (current) use of aspirin: Secondary | ICD-10-CM | POA: Insufficient documentation

## 2018-09-16 DIAGNOSIS — J449 Chronic obstructive pulmonary disease, unspecified: Secondary | ICD-10-CM | POA: Insufficient documentation

## 2018-09-16 DIAGNOSIS — R05 Cough: Secondary | ICD-10-CM | POA: Diagnosis not present

## 2018-09-16 DIAGNOSIS — J189 Pneumonia, unspecified organism: Secondary | ICD-10-CM | POA: Insufficient documentation

## 2018-09-16 LAB — CBC WITH DIFFERENTIAL/PLATELET
Abs Immature Granulocytes: 0.05 10*3/uL (ref 0.00–0.07)
Basophils Absolute: 0.1 10*3/uL (ref 0.0–0.1)
Basophils Relative: 1 %
Eosinophils Absolute: 0.1 10*3/uL (ref 0.0–0.5)
Eosinophils Relative: 1 %
HCT: 49.6 % — ABNORMAL HIGH (ref 36.0–46.0)
Hemoglobin: 15.5 g/dL — ABNORMAL HIGH (ref 12.0–15.0)
Immature Granulocytes: 1 %
Lymphocytes Relative: 26 %
Lymphs Abs: 2.4 10*3/uL (ref 0.7–4.0)
MCH: 29.4 pg (ref 26.0–34.0)
MCHC: 31.3 g/dL (ref 30.0–36.0)
MCV: 93.9 fL (ref 80.0–100.0)
Monocytes Absolute: 0.8 10*3/uL (ref 0.1–1.0)
Monocytes Relative: 9 %
Neutro Abs: 5.7 10*3/uL (ref 1.7–7.7)
Neutrophils Relative %: 62 %
Platelets: 276 10*3/uL (ref 150–400)
RBC: 5.28 MIL/uL — ABNORMAL HIGH (ref 3.87–5.11)
RDW: 12.2 % (ref 11.5–15.5)
WBC: 9.1 10*3/uL (ref 4.0–10.5)
nRBC: 0 % (ref 0.0–0.2)

## 2018-09-16 LAB — I-STAT TROPONIN, ED: Troponin i, poc: 0.01 ng/mL (ref 0.00–0.08)

## 2018-09-16 LAB — BASIC METABOLIC PANEL WITH GFR
Anion gap: 12 (ref 5–15)
BUN: 9 mg/dL (ref 8–23)
CO2: 27 mmol/L (ref 22–32)
Calcium: 9.4 mg/dL (ref 8.9–10.3)
Chloride: 99 mmol/L (ref 98–111)
Creatinine, Ser: 0.66 mg/dL (ref 0.44–1.00)
GFR calc Af Amer: >60 mL/min
GFR calc non Af Amer: >60 mL/min
Glucose, Bld: 95 mg/dL (ref 70–99)
Potassium: 3.8 mmol/L (ref 3.5–5.1)
Sodium: 138 mmol/L (ref 135–145)

## 2018-09-16 LAB — D-DIMER, QUANTITATIVE: D-Dimer, Quant: 0.62 ug/mL-FEU — ABNORMAL HIGH (ref 0.00–0.50)

## 2018-09-16 MED ORDER — DOXYCYCLINE HYCLATE 100 MG PO CAPS: 100.0000 mg | ORAL_CAPSULE | Freq: Two times a day (BID) | ORAL | 0 refills | Status: AC

## 2018-09-16 MED ORDER — IOPAMIDOL (ISOVUE-370) INJECTION 76%
100.0000 mL | Freq: Once | INTRAVENOUS | Status: AC | PRN
  Administered 2018-09-16: 100 mL via INTRAVENOUS

## 2018-09-16 MED ORDER — SODIUM CHLORIDE 0.9 % IV BOLUS
500.0000 mL | Freq: Once | INTRAVENOUS | Status: AC
  Administered 2018-09-16: 500 mL via INTRAVENOUS

## 2018-09-16 NOTE — ED Notes (Signed)
Iv team here placing a-c catheter

## 2018-09-16 NOTE — ED Triage Notes (Signed)
Pt states she has been having shortness of breath since Friday. Pain with inspiration under her left breast. Pt denies fever. Hx of asthma.

## 2018-09-16 NOTE — ED Notes (Signed)
Pt returned from c-t demanded that the us iv be removed.  removed

## 2018-09-16 NOTE — Discharge Instructions (Addendum)
Doxycycline twice daily as prescribed and complete the full course.  Recommend close follow-up with your primary care provider, return to ER for any new or worsening symptoms.

## 2018-09-16 NOTE — ED Provider Notes (Signed)
[facility] EMERGENCY DEPARTMENT Provider Note   CSN: [id number] Arrival date & time: 09/16/18  1244     History   Chief Complaint Chief Complaint  Patient presents with  . Shortness of Breath    HPI Sandra Burns is a 62 y.o. female.  62 year old female presents with complaint of pain along the left lower sternum to left breast area.  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  Patient states pain became worse on Friday, states that she has a sharp pain with taking a deep breath followed by a spasming or pulsating type pain.  This pain causes the patient to feel short of breath.  Also states pain is positional.  Patient states that she started having sweats yesterday as well as an occasional cough, no fevers, states she does not feel like she is sick, states that she takes extreme caution to avoid germs due to her chronic lung disease.  Patient called her pulmonologist today and was advised to come to the ER for evaluation.  Patient states that she has a history of pleurisy, also previous PE, not on blood thinners currently.  Also history of asthma, feels like she needs to use her inhalers more than normal he is feeling short of breath from her pain.  No other complaints or concerns.     Past Medical History:  Diagnosis Date  . Arrhythmia   . Arthritis   . Asthma   . Back pain   . Chronic obstructive airway disease (HCC)   . Diverticulosis   . Fibromyalgia   . Fibromyalgia   . GERD (gastroesophageal reflux disease)   . Irritable bowel syndrome   . Kidney stone   . Lupus (HCC)    "suspected"  . Migraine   . Mitral valve prolapse   . Osteoporosis   . Paroxysmal A-fib (HCC)   . Psoriatic arthritis (HCC)   . Raynaud's disease   . Reactive airway disease     . S/P chemotherapy, time since greater than 12 weeks 1984   cervical ca    Patient Active Problem List   Diagnosis Date Noted  . GAD (generalized anxiety disorder) 07/09/2018  . Asthma 07/09/2018  . 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 Cardiac arrhythmia 07/09/2018  . MVP (mitral valve prolapse) 07/09/2018  . GERD (gastroesophageal reflux disease) 07/09/2018  . Migraines 07/09/2018  . PTSD (post-traumatic stress disorder) 07/09/2018  . IBS (irritable bowel syndrome) 07/09/2018  . Psoriatic arthritis (HCC) 07/09/2018    Past Surgical History:  Procedure Laterality Date  . ABDOMINAL HYSTERECTOMY    . BACK SURGERY    . BREAST BIOPSY Left 20+ YRS AGO   EXCISIONAL - NEG  . CHOLECYSTECTOMY    . OOPHORECTOMY       OB History   No obstetric history on file.      Home Medications    Prior to Admission medications   Medication Sig Start Date End Date Taking? Authorizing Provider  ALPRAZolam Prudy Feeler) 0.5 MG tablet 1.5 po q am, 1.5 qd at lunch, 1 po qd at dinner, 1.5 qhs all prn. 08/15/18  Yes Hurst, Teresa T, PA-C  Artificial Tear Ointment (REFRESH P.M. OP) Place 1 application into both eyes at bedtime. Apply eye gel to both eyelids  Yes [provider]  aspirin 81 MG chewable tablet Chew 81 mg by mouth daily.   Yes [provider]  atenolol (TENORMIN) 25 MG tablet Take 12.5 mg by mouth 2 (two) times daily.    Yes [provider]  budesonide (PULMICORT) 180 MCG/ACT inhaler Inhale 2 puffs into the lungs 2 (two) times daily.    Yes [provider]  dicyclomine (BENTYL) 20 MG tablet Take 1 tablet (20 mg total) by mouth 2 (two) times daily. Patient taking differently: Take 20 mg by mouth daily as needed for spasms.  11/09/17  Yes Linwood Dibbles, MD  fluticasone [facility]) 50 MCG/ACT nasal spray Place 1 spray into both nostrils daily as needed for allergies. 07/07/18  Yes [provider]  ipratropium (ATROVENT HFA) 17 MCG/ACT inhaler Inhale 2  puffs into the lungs 4 (four) times daily.   Yes [provider]  Multiple Vitamin (MULTIVITAMIN WITH MINERALS) TABS Take 1 tablet by mouth daily.   Yes [provider]  pantoprazole (PROTONIX) 40 MG tablet Take 40 mg by mouth daily as needed (GERD). For stomach    Yes [provider]  Polyvinyl Alcohol-Povidone (REFRESH OP) Place 1 drop into both eyes daily as needed (dry eyes).   Yes [provider]  doxycycline (VIBRAMYCIN) 100 MG capsule Take 1 capsule (100 mg total) by mouth 2 (two) times daily for 10 days. 09/16/18 09/26/18  Jeannie Fend, PA-C    Family History Family History  Problem Relation Age of Onset  . Breast cancer Neg Hx     Social History Social History   Tobacco Use  . Smoking status: Former Smoker    Last attempt to quit: 07/13/1981    Years since quitting: 37.2  . Smokeless tobacco: Never Used  Substance Use Topics  . Alcohol use: Not Currently    Comment: drank as a teen.  Not since  . Drug use: Not Currently    Comment: pot as a teen     Allergies   Albuterol; Amoxicillin; Cephalosporins; Ciprofloxacin; Flagyl [metronidazole]; Macrobid [nitrofurantoin macrocrystal]; Sulfa antibiotics; Vancomycin; Vicodin [hydrocodone-acetaminophen]; Zithromax [azithromycin]; Bacitracin-neomycin-polymyxin; Tobramycin; Decongest-aid [pseudoephedrine]; Levofloxacin; and Prednisone   Review of Systems Review of Systems  Constitutional: Positive for diaphoresis. Negative for chills and fever.  HENT: Negative for congestion, rhinorrhea, sinus pressure, sinus pain and sneezing.   Respiratory: Positive for cough and shortness of breath. Negative for wheezing.   Cardiovascular: Positive for chest pain. Negative for palpitations and leg swelling.  Gastrointestinal: Negative for abdominal pain, nausea and vomiting.  Skin: Negative for wound.  Allergic/Immunologic: Positive for immunocompromised state.  Neurological: Negative for weakness.    Hematological: Does not bruise/bleed easily.  Psychiatric/Behavioral: Negative for confusion.  All other systems reviewed and are negative.    Physical Exam Updated Vital Signs BP 130/73   Pulse 69   Temp 98.2 F (36.8 C) (Oral)   Resp 15   SpO2 98%   Physical Exam Vitals signs and nursing note reviewed.  Constitutional:      General: She is not in acute distress.    Appearance: She is well-developed. She is not diaphoretic.  HENT:     Head: Normocephalic and atraumatic.  Neck:     Musculoskeletal: Neck supple.  Cardiovascular:     Rate and Rhythm: Normal rate and regular rhythm.     Heart sounds: No murmur. No friction rub.  Pulmonary:     Effort: Pulmonary effort is normal.     Breath sounds: Normal breath sounds.  No decreased breath sounds or wheezing.  Chest:     Chest wall: Tenderness present. No deformity or crepitus.    Abdominal:     Palpations: Abdomen is soft.     Tenderness: There is no abdominal tenderness.  Musculoskeletal:     Right lower leg: She exhibits no tenderness. No edema.     Left lower leg: She exhibits no tenderness. No edema.  Skin:    General: Skin is warm and dry.  Neurological:     Mental Status: She is alert and oriented to person, place, and time.  Psychiatric:        Behavior: Behavior normal.      ED Treatments / Results  Labs (all labs ordered are listed, but only abnormal results are displayed) Labs Reviewed  CBC WITH DIFFERENTIAL/PLATELET - Abnormal; Notable for the following components:      Result Value   RBC 5.28 (*)    Hemoglobin 15.5 (*)    HCT 49.6 (*)    All other components within normal limits  D-DIMER, QUANTITATIVE (NOT AT [facility]) - Abnormal; Notable for the following components:   D-Dimer, Quant 0.62 (*)    All other components within normal limits  BASIC METABOLIC PANEL  I-STAT TROPONIN, ED    EKG EKG Interpretation  Date/Time:  Sunday September 16 2018 12:58:58 EST Ventricular Rate:  66 PR  Interval:    QRS Duration: 83 QT Interval:  386 QTC Calculation: 405 R Axis:   58 Text Interpretation:  Sinus rhythm ST elevation suggests acute pericarditis since last tracing no significant change Confirmed by Mancel BaleWentz, Elliott [phone]([id number]) on 09/16/2018 5:29:25 PM   Radiology Dg Chest 2 View  Result Date: 09/16/2018 CLINICAL DATA:  Shortness of breath, chest pain with inspiration, history of asthma EXAM: CHEST - 2 VIEW COMPARISON:  03/12/2011, 03/31/2012 FINDINGS: Normal heart size and vascularity. Mild hyperinflation without focal pneumonia, collapse or consolidation. Negative for edema, effusion or pneumothorax. Trachea is midline. Spinal fusion Harrington rod noted with a residual S-shaped scoliosis. Remote cholecystectomy. IMPRESSION: Stable hyperinflation. No interval change or superimposed acute process. Electronically Signed   By: Judie PetitM.  Shick M.D.   On: 09/16/2018 14:16   Ct Angio Chest Pe W/cm &/or Wo Cm  Result Date: 09/16/2018 CLINICAL DATA:  Shortness of breath with sharp pain under the breast EXAM: CT ANGIOGRAPHY CHEST WITH CONTRAST TECHNIQUE: Multidetector CT imaging of the chest was performed using the standard protocol during bolus administration of intravenous contrast. Multiplanar CT image reconstructions and MIPs were obtained to evaluate the vascular anatomy. CONTRAST:  100mL ISOVUE-370 IOPAMIDOL (ISOVUE-370) INJECTION 76% COMPARISON:  CT 11/09/2017 FINDINGS: Cardiovascular: Satisfactory opacification of the pulmonary arteries to the segmental level. No evidence of pulmonary embolism. Normal heart size. No pericardial effusion. Mediastinum/Nodes: No enlarged mediastinal, hilar, or axillary lymph nodes. Thyroid gland, trachea, and esophagus demonstrate no significant findings. Lungs/Pleura: No acute consolidation or effusion. No pneumothorax. Mild tree-in-bud nodularity in the right lower lobe. Upper Abdomen: No acute abnormality.  Status post cholecystectomy Musculoskeletal: Scoliosis of  the spine with right-sided posterior spinal rod. Review of the MIP images confirms the above findings. IMPRESSION: 1. Negative for acute pulmonary embolus or aortic dissection 2. Mild tree-in-bud nodularity in the right lower lobe, consistent with respiratory infection, possible atypical infection. Electronically Signed   By: Jasmine PangKim  Fujinaga M.D.   On: 09/16/2018 17:28    Procedures Procedures (including critical care time)  Medications Ordered in ED Medications  sodium chloride 0.9 % bolus 500 mL (500 mLs Intravenous  New Bag/Given 09/16/18 1800)  iopamidol (ISOVUE-370) 76 % injection 100 mL (100 mLs Intravenous Contrast Given 09/16/18 1655)     Initial Impression / Assessment and Plan / ED Course  I have reviewed the triage vital signs and the nursing notes.  Pertinent labs & imaging results that were available during my care of the patient were reviewed by me and considered in my medical decision making (see chart for details).  Clinical Course as of Sep 16 1812  Sun Sep 16, 2018  [id number] 62 year old female presents with complaint of left-sided chest discomfort, worse with taking a deep breath and occasional cough.  On exam lung sounds are clear, pain is not reproduced with palpation, there is no swelling of the legs.  Chest x-ray is unremarkable, troponin negative, CBC and BMP without significant findings, EKG unchanged compared to previous.  Patient's d-dimer is mildly elevated so a CT scan was ordered to rule out PE.  CT is negative for PE, does show small tree-in-bud infiltrate on the right concerning for atypical pneumonia.  Patient has multiple antibiotic allergies however she is able to take doxycycline.  Patient is otherwise well with reassuring labs and vital signs.  Patient will be treated with doxycycline, advised to follow-up with her primary care provider for recheck and return to ER for worsening or concerning symptoms.   [LM]    Clinical Course User Index [LM] Jeannie FendMurphy,  A,  PA-C   Final Clinical Impressions(s) / ED Diagnoses   Final diagnoses:  Community acquired pneumonia of right lung, unspecified part of lung  Atypical chest pain    ED Discharge Orders         Ordered    doxycycline (VIBRAMYCIN) 100 MG capsule  2 times daily     09/16/18 1801           Jeannie FendMurphy,  A, PA-C 09/16/18 1813    Alvira MondaySchlossman, Erin, MD 09/18/18 1429

## 2018-09-25 DIAGNOSIS — J4531 Mild persistent asthma with (acute) exacerbation: Secondary | ICD-10-CM | POA: Diagnosis not present

## 2018-09-25 DIAGNOSIS — J181 Lobar pneumonia, unspecified organism: Secondary | ICD-10-CM | POA: Diagnosis not present

## 2018-10-05 DIAGNOSIS — J340 Abscess, furuncle and carbuncle of nose: Secondary | ICD-10-CM | POA: Diagnosis not present

## 2018-10-05 DIAGNOSIS — R918 Other nonspecific abnormal finding of lung field: Secondary | ICD-10-CM | POA: Diagnosis not present

## 2018-10-08 ENCOUNTER — Telehealth: Payer: Self-pay

## 2018-10-08 NOTE — Telephone Encounter (Signed)
msg sent to provider 

## 2018-10-08 NOTE — Telephone Encounter (Signed)
Copied from CRM [id number]. Topic: Appointment Scheduling - Scheduling Inquiry for Clinic >> Oct 08, 2018  2:47 PM Maia Petties wrote: Reason for CRM: pt is requesting to see Dr. Lorin Picket as new PCP. Pt states she saw Dr. Lorin Picket years ago at [facility] and would love to have her back if possible. Pt has [employer] MCR. Please advise.

## 2018-10-11 ENCOUNTER — Telehealth: Payer: Self-pay | Admitting: Physician Assistant

## 2018-10-11 NOTE — Telephone Encounter (Signed)
PA approved and received today

## 2018-10-11 NOTE — Telephone Encounter (Signed)
Disregard previous message .The issue has been resolved by the patient. Thanks

## 2018-10-11 NOTE — Telephone Encounter (Signed)
Patient requesting refill on Xanax. Having difficulties due to count restrictions per pharmacy statement. Concerned she will be without if this matter is not addressed soon. [facility] [address] [place]. She would appreciate a call ASAP #[phone].

## 2018-10-18 ENCOUNTER — Telehealth: Payer: Self-pay

## 2018-10-18 NOTE — Telephone Encounter (Signed)
Prior Auth approved by [employer] for Alprazolam 0.5 Mg 09/26/2018-09/26/2019.

## 2018-10-20 DIAGNOSIS — J181 Lobar pneumonia, unspecified organism: Secondary | ICD-10-CM | POA: Diagnosis not present

## 2018-10-21 DIAGNOSIS — J181 Lobar pneumonia, unspecified organism: Secondary | ICD-10-CM | POA: Diagnosis not present

## 2018-10-22 ENCOUNTER — Telehealth: Payer: Self-pay

## 2018-10-22 DIAGNOSIS — R911 Solitary pulmonary nodule: Secondary | ICD-10-CM | POA: Diagnosis not present

## 2018-10-22 DIAGNOSIS — J181 Lobar pneumonia, unspecified organism: Secondary | ICD-10-CM | POA: Diagnosis not present

## 2018-10-22 DIAGNOSIS — J4530 Mild persistent asthma, uncomplicated: Secondary | ICD-10-CM | POA: Diagnosis not present

## 2018-10-22 NOTE — Telephone Encounter (Signed)
New pt appt made for 2/25 with Dr. [employer]

## 2018-10-23 NOTE — Telephone Encounter (Signed)
NP paperwork mailed out on 01/28 ry

## 2018-10-25 DIAGNOSIS — R05 Cough: Secondary | ICD-10-CM | POA: Diagnosis not present

## 2018-10-25 DIAGNOSIS — R69 Illness, unspecified: Secondary | ICD-10-CM | POA: Diagnosis not present

## 2018-10-25 DIAGNOSIS — J4530 Mild persistent asthma, uncomplicated: Secondary | ICD-10-CM | POA: Diagnosis not present

## 2018-10-25 DIAGNOSIS — R918 Other nonspecific abnormal finding of lung field: Secondary | ICD-10-CM | POA: Diagnosis not present

## 2018-10-25 DIAGNOSIS — R911 Solitary pulmonary nodule: Secondary | ICD-10-CM | POA: Diagnosis not present

## 2018-10-28 DIAGNOSIS — J181 Lobar pneumonia, unspecified organism: Secondary | ICD-10-CM | POA: Diagnosis not present

## 2018-10-29 DIAGNOSIS — J181 Lobar pneumonia, unspecified organism: Secondary | ICD-10-CM | POA: Diagnosis not present

## 2018-10-31 DIAGNOSIS — J019 Acute sinusitis, unspecified: Secondary | ICD-10-CM | POA: Diagnosis not present

## 2018-10-31 DIAGNOSIS — J181 Lobar pneumonia, unspecified organism: Secondary | ICD-10-CM | POA: Diagnosis not present

## 2018-10-31 DIAGNOSIS — J069 Acute upper respiratory infection, unspecified: Secondary | ICD-10-CM | POA: Diagnosis not present

## 2018-11-05 ENCOUNTER — Telehealth: Payer: Self-pay | Admitting: Physician Assistant

## 2018-11-05 NOTE — Telephone Encounter (Signed)
Patient called would like future [facility] [address] [place] location. [phone]. [facility].

## 2018-11-05 NOTE — Telephone Encounter (Signed)
Noted in chart.

## 2018-11-20 ENCOUNTER — Ambulatory Visit: Payer: Self-pay | Admitting: Internal Medicine

## 2018-11-26 ENCOUNTER — Ambulatory Visit: Payer: Medicare HMO | Admitting: Psychiatry

## 2018-11-26 DIAGNOSIS — F4310 Post-traumatic stress disorder, unspecified: Secondary | ICD-10-CM | POA: Diagnosis not present

## 2018-11-26 DIAGNOSIS — A310 Pulmonary mycobacterial infection: Secondary | ICD-10-CM

## 2018-11-26 DIAGNOSIS — F3341 Major depressive disorder, recurrent, in partial remission: Secondary | ICD-10-CM

## 2018-11-26 DIAGNOSIS — F411 Generalized anxiety disorder: Secondary | ICD-10-CM

## 2018-11-26 DIAGNOSIS — R69 Illness, unspecified: Secondary | ICD-10-CM | POA: Diagnosis not present
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
## 2018-12-04 ENCOUNTER — Ambulatory Visit: Payer: Medicare HMO | Admitting: Physician Assistant

## 2018-12-04 ENCOUNTER — Encounter: Payer: Self-pay | Admitting: Physician Assistant

## 2018-12-04 DIAGNOSIS — R69 Illness, unspecified: Secondary | ICD-10-CM | POA: Diagnosis not present

## 2018-12-04 DIAGNOSIS — F4310 Post-traumatic stress disorder, unspecified: Secondary | ICD-10-CM | POA: Diagnosis not present

## 2018-12-04 DIAGNOSIS — F411 Generalized anxiety disorder: Secondary | ICD-10-CM | POA: Diagnosis not present

## 2018-12-04 MED ORDER — ALPRAZOLAM 0.5 MG PO TABS: ORAL_TABLET | ORAL | 3 refills | Status: DC

## 2018-12-04 NOTE — Progress Notes (Signed)
[facility]  [name],  MRN: [id number]  PCP: Barbette Reichmann, MD  Date of Evaluation: 12/04/2018 Time spent:25 minutes  Chief Complaint:  Chief Complaint    Follow-up      HISTORY/CURRENT STATUS: HPI Here for routine med check.  Has been dx with Nontuberculous Mycobacterial Lung Disease.  She's waiting to have further testing done before she starts tx. "I'm doing as well as I can.  It's life altering.  I have to boil my water now.  I'm in a support group.  It helps to know other people are going through it. I just have to be very careful to not contract any other illnesses."   Patient denies loss of interest in usual activities and is able to enjoy things.  She has decreased energy & motivation but states that is chronic.  Appetite has not changed.  No extreme sadness, tearfulness, or feelings of hopelessness.  Denies any changes in concentration, making decisions or remembering things.  Denies suicidal or homicidal thoughts. "Of course I'm concerned about this new dx, but I'm trying to go with it, so I'll get better.  I need to be able to breathe better and not be so tired."  Anxiety is pretty well-controlled. She's changed drug stores and they didn't have the Xanax last time.  She [facility] different [facility].   Denies muscle or joint pain, stiffness, or dystonia.  Denies dizziness, syncope, seizures, numbness, tingling, tremor, tics, unsteady gait, slurred speech, confusion.   Individual Medical History/ Review of Systems: Changes? :Yes See above.  Further work-up and treatment are pending  Past medications for mental health diagnoses include: Zoloft, BuSpar, Ativan  Allergies: Albuterol; Amoxicillin; Cephalosporins; Ciprofloxacin; Flagyl [metronidazole]; Macrobid [nitrofurantoin macrocrystal]; Sulfa antibiotics; Vancomycin; Vicodin [hydrocodone-acetaminophen]; Zithromax [azithromycin]; Bacitracin-neomycin-polymyxin; Tobramycin;  Decongest-aid [pseudoephedrine]; Levofloxacin; and Prednisone  Current Medications:  Current Outpatient Medications:  .  ALPRAZolam (XANAX) 0.5 MG tablet, 1.5 po q am, 1.5 qd at lunch, 1 po qd at dinner, 1.5 qhs all prn., Disp: 165 tablet, Rfl: 3 .  Artificial Tear Ointment (REFRESH P.M. OP), Place 1 application into both eyes at bedtime. Apply eye gel to both eyelids, Disp: , Rfl:  .  aspirin 81 MG chewable tablet, Chew 81 mg by mouth daily., Disp: , Rfl:  .  atenolol (TENORMIN) 25 MG tablet, Take 12.5 mg by mouth 2 (two) times daily. , Disp: , Rfl:  .  budesonide (PULMICORT) 180 MCG/ACT inhaler, Inhale 2 puffs into the lungs 2 (two) times daily. , Disp: , Rfl:  .  dicyclomine (BENTYL) 20 MG tablet, Take 1 tablet (20 mg total) by mouth 2 (two) times daily. (Patient taking differently: Take 20 mg by mouth daily as needed for spasms. ), Disp: 20 tablet, Rfl: 0 .  fluticasone (FLONASE) 50 MCG/ACT nasal spray, Place 1 spray into both nostrils daily as needed for allergies., Disp: , Rfl: 6 .  ipratropium (ATROVENT HFA) 17 MCG/ACT inhaler, Inhale 2 puffs into the lungs 4 (four) times daily., Disp: , Rfl:  .  Multiple Vitamin (MULTIVITAMIN WITH MINERALS) TABS, Take 1 tablet by mouth daily., Disp: , Rfl:  .  pantoprazole (PROTONIX) 40 MG tablet, Take 40 mg by mouth daily as needed (GERD). For stomach , Disp: , Rfl:  .  Polyvinyl Alcohol-Povidone (REFRESH OP), Place 1 drop into both eyes daily as needed (dry eyes)., Disp: , Rfl:  Medication Side Effects: none  Family Medical/ Social History: Changes? No  MENTAL HEALTH EXAM:  There were no vitals taken for this visit.There is no height or weight on file to calculate BMI.  General Appearance: Casual and Well Groomed thin, wearing mask   Eye Contact:  Good  Speech:  Clear and Coherent  Volume:  Normal  Mood:  Euthymic  Affect:  Appropriate  Thought Process:  Goal Directed  Orientation:  Full (Time, Place, and Person)  Thought Content: Logical    Suicidal Thoughts:  No  Homicidal Thoughts:  No  Memory:  WNL  Judgement:  Good  Insight:  Good  Psychomotor Activity:  Normal  Concentration:  Concentration: Good  Recall:  Good  Fund of Knowledge: Good  Language: Good  Assets:  Desire for Improvement  ADL's:  Intact  Cognition: WNL  Prognosis:  Good    DIAGNOSES:    ICD-10-CM   1. Generalized anxiety disorder F41.1   2. PTSD (post-traumatic stress disorder) F43.10     Receiving Psychotherapy: Yes with Dr. [name].   RECOMMENDATIONS: I spent 25 minutes with her and 50% of that time was in counseling concerning mental health diagnosis and treatment options. Continue Xanax 0.5mg , 1.5 pills q am, 1.5 pills at lunch, 1 pill at dinner, 1.5 at dinner.  Again we discussed the fact that maybe a preventative medication for anxiety, such as trying a different SSRI or SNRI than she has ever tried before, would be helpful.  Because of her current health problems, neither of [place] want to add on another medication. Continue all other meds. She'll be seeing Infectious Disease provider soon.  Continue therapy with Dr. Farrel Demark Return in 4 months or sooner prn.    Melony Overly, PA-C   This record has been created using [employer].  Chart creation errors have been sought, but may not always have been located and corrected. Such creation errors do not reflect on the standard of medical care.

## 2018-12-07 ENCOUNTER — Ambulatory Visit: Payer: Medicare HMO | Admitting: Psychiatry

## 2018-12-10 ENCOUNTER — Encounter: Payer: Self-pay | Admitting: Psychiatry

## 2018-12-10 ENCOUNTER — Ambulatory Visit: Payer: Medicare HMO | Admitting: Psychiatry

## 2018-12-10 NOTE — Progress Notes (Signed)
No-show/Short-notice cancellation note [name], PhD, [facility]  [name]     MRN: [id number]     Date: 12/10/2018   PT called short-notice to cancel 10am appt, citing illness and/or COVID exposure concerns.  Waive fee, RS as able.  [name], PhD LP Licensed Psychologist

## 2018-12-17 ENCOUNTER — Ambulatory Visit: Payer: Medicare HMO | Admitting: Physician Assistant

## 2018-12-20 ENCOUNTER — Other Ambulatory Visit: Payer: Self-pay

## 2018-12-20 ENCOUNTER — Ambulatory Visit (INDEPENDENT_AMBULATORY_CARE_PROVIDER_SITE_OTHER): Payer: Medicare HMO | Admitting: Psychiatry

## 2018-12-20 DIAGNOSIS — F4310 Post-traumatic stress disorder, unspecified: Secondary | ICD-10-CM | POA: Diagnosis not present

## 2018-12-20 DIAGNOSIS — F411 Generalized anxiety disorder: Secondary | ICD-10-CM

## 2018-12-20 DIAGNOSIS — R69 Illness, unspecified: Secondary | ICD-10-CM | POA: Diagnosis not present

## 2018-12-20 DIAGNOSIS — G4700 Insomnia, unspecified: Secondary | ICD-10-CM

## 2018-12-20 DIAGNOSIS — F422 Mixed obsessional thoughts and acts: Secondary | ICD-10-CM | POA: Diagnosis not present

## 2018-12-20 DIAGNOSIS — J45909 Unspecified asthma, uncomplicated: Secondary | ICD-10-CM

## 2018-12-20 DIAGNOSIS — Z638 Other specified problems related to primary support group: Secondary | ICD-10-CM | POA: Diagnosis not present

## 2018-12-20 DIAGNOSIS — F4323 Adjustment disorder with mixed anxiety and depressed mood: Secondary | ICD-10-CM

## 2018-12-20 DIAGNOSIS — Z62820 Parent-biological child conflict: Secondary | ICD-10-CM | POA: Diagnosis not present
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
## 2018-12-25 ENCOUNTER — Other Ambulatory Visit: Payer: Self-pay

## 2018-12-25 ENCOUNTER — Ambulatory Visit (INDEPENDENT_AMBULATORY_CARE_PROVIDER_SITE_OTHER): Payer: Medicare HMO | Admitting: Psychiatry

## 2018-12-25 ENCOUNTER — Ambulatory Visit (INDEPENDENT_AMBULATORY_CARE_PROVIDER_SITE_OTHER): Payer: Medicare HMO | Admitting: Infectious Disease

## 2018-12-25 ENCOUNTER — Encounter: Payer: Self-pay | Admitting: Infectious Disease

## 2018-12-25 DIAGNOSIS — I341 Nonrheumatic mitral (valve) prolapse: Secondary | ICD-10-CM | POA: Diagnosis not present

## 2018-12-25 DIAGNOSIS — K2100 Gastro-esophageal reflux disease with esophagitis, without bleeding: Secondary | ICD-10-CM

## 2018-12-25 DIAGNOSIS — Q228 Other congenital malformations of tricuspid valve: Secondary | ICD-10-CM

## 2018-12-25 DIAGNOSIS — F4310 Post-traumatic stress disorder, unspecified: Secondary | ICD-10-CM

## 2018-12-25 DIAGNOSIS — Z62820 Parent-biological child conflict: Secondary | ICD-10-CM

## 2018-12-25 DIAGNOSIS — F411 Generalized anxiety disorder: Secondary | ICD-10-CM | POA: Diagnosis not present

## 2018-12-25 DIAGNOSIS — Z638 Other specified problems related to primary support group: Secondary | ICD-10-CM | POA: Diagnosis not present

## 2018-12-25 DIAGNOSIS — R69 Illness, unspecified: Secondary | ICD-10-CM | POA: Diagnosis not present

## 2018-12-25 DIAGNOSIS — A310 Pulmonary mycobacterial infection: Secondary | ICD-10-CM

## 2018-12-25 DIAGNOSIS — I369 Nonrheumatic tricuspid valve disorder, unspecified: Secondary | ICD-10-CM | POA: Diagnosis not present

## 2018-12-25 DIAGNOSIS — J4520 Mild intermittent asthma, uncomplicated: Secondary | ICD-10-CM | POA: Diagnosis not present

## 2018-12-25 HISTORY — DX: Pulmonary mycobacterial infection: A31.0

## 2018-12-25 NOTE — Progress Notes (Signed)
Virtual Visit via Telephone Note  I connected with Sandra Burns on 12/25/18 at  9:00 AM EDT by telephone and verified that I am speaking with the correct person using two identifiers.   I discussed the limitations, risks, security and privacy concerns of performing an evaluation and management service by telephone and the availability of in person appointments. I also discussed with the patient that there may be a patient responsible charge related to this service. The patient expressed understanding and agreed to proceed.   History of Present Illness:  Reason for infectious disease consult: Concern for Mycobacterium avium infection  Requesting physician: Dr. Ginette Pitman  Sandra Burns is a 63 year old Caucasian female with a past medical history for asthma, COPD, apparent mitral valve prolapse, atrial fibrillation who had developed dyspnea and ultimately underwent evaluation in the emergency department in December of this past year.  Her d-dimer was slightly positive and she underwent CT angiogram.  It was negative for pulmonary embolism but did show a Mild tree-in-bud nodularity in the right lower lobe.   She has been evaluated by pulmonary [place] who reviewed her films and found that she might have very mild bronchiectatic changes confined to the left and right upper lobe.  Patient has undergone repeat imaging in [place] which again showed some nodularity.  The question of whether she might have Mycobacterium avium was raised  Interim the patient who had not been coughing has been doing all sorts of maneuvers to try to get herself to cough up sputum including sleeping in a recliner and doing other maneuvers to get sputum up such as using a flutter valve.  She was able to get 3 sputum's for submission and 2 of the 3 did not grow any organisms the third grew Mycobacterium avium complex.  She does not seem to cough at all except if she is trying to get sputum up at which  she does cheat chiefly at night.  She is without fevers she is without weight loss.  He does however have some dyspnea on exertion that is been going on for several months.  In talking to her I am not at all convinced that she has clear-cut active Mycobacterium avium infection.  Certainly she does not meet ATS guidelines definition for such infection.  I have discussed with her that even if she did have evidence of Mycobacterium infection her symptom complex to me would not be wanting which I would be pushing hard for her to be on therapy.  After careful discussion I felt it prudent to not initiate therapy but to revisit how she is doing in roughly 3 to 4 months time.  1 can contemplate repeating the CT scan 6 months after the most recent one which was done in January.      Past Medical History:  Diagnosis Date  . Arrhythmia   . Arthritis   . Asthma   . Back pain   . Chronic obstructive airway disease ([place])   . Diverticulosis   . Fibromyalgia   . Fibromyalgia   . GERD (gastroesophageal reflux disease)   . Irritable bowel syndrome   . Kidney stone   . Lupus ([place])    "suspected"  . Migraine   . Mitral valve prolapse   . 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 Osteoporosis   . Paroxysmal A-fib ([place])   . Psoriatic arthritis ([place])   . Raynaud's disease   . Reactive airway disease   . S/P chemotherapy, time since greater than 12  weeks 1984   cervical ca    Past Surgical History:  Procedure Laterality Date  . ABDOMINAL HYSTERECTOMY    . BACK SURGERY    . BREAST BIOPSY Left 20+ YRS AGO   EXCISIONAL - NEG  . CHOLECYSTECTOMY    . OOPHORECTOMY      Family History  Problem Relation Age of Onset  . Breast cancer Neg Hx       Social History   Socioeconomic History  . Marital status: Married    Spouse name: Not on file  . Number of children: Not on file  . Years of education: Not on file  . Highest education level: Not on file  Occupational History  .  Occupation: Therapist, sports    Comment: Disabled since '09 Cardiopulmonary reasons  Social Needs  . Financial resource strain: Not on file  . Food insecurity:    Worry: Not on file    Inability: Not on file  . Transportation needs:    Medical: Not on file    Non-medical: Not on file  Tobacco Use  . Smoking status: Former Smoker    Last attempt to quit: 07/13/1981    Years since quitting: 37.4  . Smokeless tobacco: Never Used  Substance and Sexual Activity  . Alcohol use: Not Currently    Comment: drank as a teen.  Not since  . Drug use: Not Currently    Comment: pot as a teen  . Sexual activity: Yes  Lifestyle  . Physical activity:    Days per week: Not on file    Minutes per session: Not on file  . Stress: Not on file  Relationships  . Social connections:    Talks on phone: Not on file    Gets together: Not on file    Attends religious service: Not on file    Active member of club or organization: Not on file    Attends meetings of clubs or organizations: Not on file    Relationship status: Not on file  Other Topics Concern  . Not on file  Social History Narrative  . Not on file    Allergies  Allergen Reactions  . Albuterol Other (See Comments)    Atrial fibrillation Atrial fibrillation   . Amoxicillin Itching and Swelling    Has patient had a PCN reaction causing immediate rash, facial/tongue/throat swelling, SOB or lightheadedness with hypotension: Yes Has patient had a PCN reaction causing severe rash involving mucus membranes or skin necrosis: Yes Has patient had a PCN reaction that required hospitalization: Yes Has patient had a PCN reaction occurring within the last 10 years: Yes If all of the above answers are "NO", then may proceed with Cephalosporin use.   . Cephalosporins Itching and Rash    Rash and itching  . Ciprofloxacin Swelling  . Flagyl [Metronidazole] Dermatitis and Other (See Comments)    Other Reaction: SKIN SLOUGHING  . Macrobid [Nitrofurantoin  Macrocrystal] Itching  . Sulfa Antibiotics Itching  . Vancomycin Shortness Of Breath  . Vicodin [Hydrocodone-Acetaminophen] Shortness Of Breath  . Zithromax [Azithromycin] Other (See Comments)    GI upset, C-diff  . Bacitracin-Neomycin-Polymyxin Itching and Swelling    Pt got the issue for the eye lids after instill the eyes drops.   . Tobramycin Itching and Swelling    Pt got the issue after instill the eyes drops  . Decongest-Aid [Pseudoephedrine] Palpitations  . Levofloxacin Rash    Itchy, swelling  . Prednisone Palpitations     Current Outpatient  Medications:  .  ALPRAZolam (XANAX) 0.5 MG tablet, 1.5 po q am, 1.5 qd at lunch, 1 po qd at dinner, 1.5 qhs all prn., Disp: 165 tablet, Rfl: 3 .  Artificial Tear Ointment (REFRESH P.M. OP), Place 1 application into both eyes at bedtime. Apply eye gel to both eyelids, Disp: , Rfl:  .  aspirin 81 MG chewable tablet, Chew 81 mg by mouth daily., Disp: , Rfl:  .  atenolol (TENORMIN) 25 MG tablet, Take 12.5 mg by mouth 2 (two) times daily. , Disp: , Rfl:  .  budesonide (PULMICORT) 180 MCG/ACT inhaler, Inhale 2 puffs into the lungs 2 (two) times daily. , Disp: , Rfl:  .  dicyclomine (BENTYL) 20 MG tablet, Take 1 tablet (20 mg total) by mouth 2 (two) times daily. (Patient taking differently: Take 20 mg by mouth daily as needed for spasms. ), Disp: 20 tablet, Rfl: 0 .  fluticasone (FLONASE) 50 MCG/ACT nasal spray, Place 1 spray into both nostrils daily as needed for allergies., Disp: , Rfl: 6 .  ipratropium (ATROVENT HFA) 17 MCG/ACT inhaler, Inhale 2 puffs into the lungs 4 (four) times daily., Disp: , Rfl:  .  Multiple Vitamin (MULTIVITAMIN WITH MINERALS) TABS, Take 1 tablet by mouth daily., Disp: , Rfl:  .  pantoprazole (PROTONIX) 40 MG tablet, Take 40 mg by mouth daily as needed (GERD). For stomach , Disp: , Rfl:  .  Polyvinyl Alcohol-Povidone (REFRESH OP), Place 1 drop into both eyes daily as needed (dry eyes)., Disp: , Rfl:    Review of systems  as noted in history of present illness and otherwise negative on 12 point review of systems    Observations/Objective:  Asthma and COPD  Mild bronchiectasis which seems confined to a few lobes of the lung  Positive culture for Mycobacterium avium which may represent a colonizer versus a true pathogen though she is not clearly showing sufficient evidence to me that this is the case  Dyspnea on exertion  Assessment and Plan:  Mycobacterium avium positive culture: This may be a colonizer.  If it is a true pathogen she really lacks most of the classical symptoms of a person who is afflicted with this illness such as a daily cough that is disturbing her quality of life weight loss or significant radiographic findings on imaging.  It was more prudent to follow her clinically and see how she is doing before considering initiating therapy against this organism.  Mild bronchiectasis: Defer to pulmonary regards to management of this.  Dyspnea: Could be multifactorial and will defer to her pulmonary physician her primary care physician and her cardiologist.  Follow Up Instructions:    I discussed the assessment and treatment plan with the patient. The patient was provided an opportunity to ask questions and all were answered. The patient agreed with the plan and demonstrated an understanding of the instructions.   The patient was advised to call back or seek an in-person evaluation if the symptoms worsen or if the condition fails to improve as anticipated.  I provided 40 minutes of non-face-to-face time during this encounter.   Alcide Evener, MD
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
## 2018-12-31 DIAGNOSIS — R911 Solitary pulmonary nodule: Secondary | ICD-10-CM | POA: Diagnosis not present

## 2018-12-31 DIAGNOSIS — J45909 Unspecified asthma, uncomplicated: Secondary | ICD-10-CM | POA: Diagnosis not present

## 2019-01-11 ENCOUNTER — Ambulatory Visit (INDEPENDENT_AMBULATORY_CARE_PROVIDER_SITE_OTHER): Payer: Medicare HMO | Admitting: Psychiatry

## 2019-01-11 ENCOUNTER — Other Ambulatory Visit: Payer: Self-pay

## 2019-01-11 DIAGNOSIS — F411 Generalized anxiety disorder: Secondary | ICD-10-CM

## 2019-01-11 DIAGNOSIS — Z638 Other specified problems related to primary support group: Secondary | ICD-10-CM

## 2019-01-11 DIAGNOSIS — F4323 Adjustment disorder with mixed anxiety and depressed mood: Secondary | ICD-10-CM | POA: Diagnosis not present

## 2019-01-11 DIAGNOSIS — A310 Pulmonary mycobacterial infection: Secondary | ICD-10-CM

## 2019-01-11 DIAGNOSIS — F3341 Major depressive disorder, recurrent, in partial remission: Secondary | ICD-10-CM | POA: Diagnosis not present

## 2019-01-11 DIAGNOSIS — R69 Illness, unspecified: Secondary | ICD-10-CM | POA: Diagnosis not present
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
## 2019-03-01 ENCOUNTER — Other Ambulatory Visit: Payer: Self-pay

## 2019-03-01 ENCOUNTER — Ambulatory Visit (INDEPENDENT_AMBULATORY_CARE_PROVIDER_SITE_OTHER): Payer: Medicare HMO | Admitting: Psychiatry

## 2019-03-01 DIAGNOSIS — Z630 Problems in relationship with spouse or partner: Secondary | ICD-10-CM

## 2019-03-01 DIAGNOSIS — F411 Generalized anxiety disorder: Secondary | ICD-10-CM

## 2019-03-01 DIAGNOSIS — G4700 Insomnia, unspecified: Secondary | ICD-10-CM

## 2019-03-01 DIAGNOSIS — R69 Illness, unspecified: Secondary | ICD-10-CM | POA: Diagnosis not present

## 2019-03-01 DIAGNOSIS — Z8659 Personal history of other mental and behavioral disorders: Secondary | ICD-10-CM | POA: Diagnosis not present

## 2019-03-01 DIAGNOSIS — A310 Pulmonary mycobacterial infection: Secondary | ICD-10-CM | POA: Diagnosis not present

## 2019-03-01 DIAGNOSIS — F3341 Major depressive disorder, recurrent, in partial remission: Secondary | ICD-10-CM | POA: Diagnosis not present
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
## 2019-03-07 ENCOUNTER — Telehealth: Payer: Self-pay | Admitting: *Deleted

## 2019-03-07 NOTE — Telephone Encounter (Signed)
Patient called to see if her appointment 6/23 could be a phone visit rather than in person visit? She is concerned about her risk. Landis Gandy, RN

## 2019-03-08 NOTE — Telephone Encounter (Signed)
Yes-- certainly.

## 2019-03-08 NOTE — Telephone Encounter (Signed)
Confirmed with patient. °Thanks! °

## 2019-03-12 DIAGNOSIS — K582 Mixed irritable bowel syndrome: Secondary | ICD-10-CM | POA: Diagnosis not present

## 2019-03-12 DIAGNOSIS — A310 Pulmonary mycobacterial infection: Secondary | ICD-10-CM | POA: Diagnosis not present

## 2019-03-12 DIAGNOSIS — J449 Chronic obstructive pulmonary disease, unspecified: Secondary | ICD-10-CM | POA: Diagnosis not present

## 2019-03-12 DIAGNOSIS — K219 Gastro-esophageal reflux disease without esophagitis: Secondary | ICD-10-CM | POA: Diagnosis not present

## 2019-03-19 ENCOUNTER — Encounter: Payer: Self-pay | Admitting: Infectious Disease

## 2019-03-19 ENCOUNTER — Ambulatory Visit (INDEPENDENT_AMBULATORY_CARE_PROVIDER_SITE_OTHER): Payer: Medicare HMO | Admitting: Psychiatry

## 2019-03-19 ENCOUNTER — Other Ambulatory Visit: Payer: Self-pay

## 2019-03-19 ENCOUNTER — Ambulatory Visit (INDEPENDENT_AMBULATORY_CARE_PROVIDER_SITE_OTHER): Payer: Medicare HMO | Admitting: Infectious Disease

## 2019-03-19 DIAGNOSIS — I341 Nonrheumatic mitral (valve) prolapse: Secondary | ICD-10-CM

## 2019-03-19 DIAGNOSIS — G4700 Insomnia, unspecified: Secondary | ICD-10-CM

## 2019-03-19 DIAGNOSIS — A310 Pulmonary mycobacterial infection: Secondary | ICD-10-CM

## 2019-03-19 DIAGNOSIS — F3341 Major depressive disorder, recurrent, in partial remission: Secondary | ICD-10-CM | POA: Diagnosis not present

## 2019-03-19 DIAGNOSIS — R69 Illness, unspecified: Secondary | ICD-10-CM | POA: Diagnosis not present

## 2019-03-19 DIAGNOSIS — F411 Generalized anxiety disorder: Secondary | ICD-10-CM

## 2019-03-19 DIAGNOSIS — Z8659 Personal history of other mental and behavioral disorders: Secondary | ICD-10-CM

## 2019-03-19 DIAGNOSIS — Z638 Other specified problems related to primary support group: Secondary | ICD-10-CM

## 2019-03-19 DIAGNOSIS — K588 Other irritable bowel syndrome: Secondary | ICD-10-CM

## 2019-03-19 DIAGNOSIS — B4481 Allergic bronchopulmonary aspergillosis: Secondary | ICD-10-CM

## 2019-03-19 DIAGNOSIS — J45909 Unspecified asthma, uncomplicated: Secondary | ICD-10-CM | POA: Diagnosis not present

## 2019-03-19 HISTORY — DX: Allergic bronchopulmonary aspergillosis: B44.81

## 2019-03-19 NOTE — Progress Notes (Signed)
Psychotherapy Progress Note [facility] Sandra Moore, PhD LP  [name]     MRN: [id number]     Therapy format: Individual psychotherapy Date: 03/19/2019     Start: 2:09p Stop: 3:16p Time Spent: 67 min (45 charged, 22 min donated) Location: telehealth   Telehealth visit -- I connected with this patient by an approved telecommunication method (video), with her informed consent, and verifying identity and patient privacy.  I was located at my office and patient at her home.  As needed, we discussed the limitations, risks, and security and privacy concerns associated with telehealth service, including the availability and conditions which currently govern in-person appointments and the possibility that 3rd-party payment may not be fully guaranteed and she may be responsible for charges.  After she indicated understanding, we proceeded with the session.  Also discussed treatment planning, as needed, including ongoing verbal agreement with the plan, the opportunity to ask and answer all questions, her demonstrated understanding of instructions, and her readiness to call the office should symptoms worsen or she feels she is in a crisis state and needs more immediate and tangible assistance.  Session narrative (presenting needs, interim history, self-report of stressors and symptoms, applications of prior therapy, status changes, and interventions made in session) Unsettled by social unrest of late.  Finds herself feeling nauseous if she overshoots Xanax dosing.  Insomnia flaring up of late.  Trying to restrain news intake, but people want to call and tell her things anyway.  Worries about lawlessness breaking out.  Naturally worries whether things will return to normal.  Glad to have a friend wonder, too.  More stressful at home on days deliveries come.  Husband has cognitive issues, family history of dementia, may have ADD.  For whatever reason, he can't remember  all steps (even 3) in decontamination process.  May claim he can't learn at 63yo, turn martyr when pressed.  PT trying not to be rude, but   Infectious disease specialist and pulmonologist adamant about not risking viral exposure, given her lung issues.  Scrupulously avoiding enclosed spaces like elevator at her multilevel condo.  Will get out for a rare car ride with husband.  Maybe 5-6 outings since January.    C/o not making broad memories of her days, partly for overfocusing, e.g., on her genealogy work, and sometimes for anxious overarousal.  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  Has a recurring distressing dream, when overwhelmed or under deadline, of wave crashing over her.  Last couple weeks phenomenon of older music popping up in her head, often enough music which transports her to better times, sometimes which express feelings,   Concerned for negativity and hatred expressed in news, exchanges of views, Facebook.  Says she has been pressed to apologize for her own race in discussion of race rights and redress of grievances.  Offered idea for communicating with Sandra Burns when he is resistant or negative, to inquire assertively.  Therapeutic modalities: Cognitive Behavioral Therapy, Assertiveness/Communication, Interpersonal and faith-sensitive  Mental Status/Observations:  Appearance:   Casual     Behavior:  Appropriate  Motor:  Normal  Speech/Language:   Clear and Coherent  Affect:  Appropriate and restrained  Mood:  wearied  Thought process:  normal  Thought content:    WNL  Sensory/Perceptual disturbances:    WNL  Orientation:  grossly intact  Attention:  Good  Concentration:  Fair  Memory:  WNL  Insight:    Good  Judgment:  Good  Impulse Control:  Good   Risk Assessment: Danger to Self:  No Self-injurious Behavior: No Danger to Others: No Duty to Warn:no Physical Aggression / Violence:No  Access to Firearms a concern: No   Diagnosis:    ICD-10-CM   1. Recurrent major depressive disorder, in partial remission ([place])  F33.41   2. Generalized anxiety disorder  F41.1   3. Insomnia, unspecified type  G47.00   4. History of posttraumatic stress disorder (PTSD)  Z86.59   5. Relationship problem with family member  Z63.8   25. 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.0   7. Asthma, unspecified asthma severity, unspecified whether complicated, unspecified whether persistent  J45.909    Assessment of progress:  stable  Plan:  . Try inquisitive approach to husband's balking and fatal-izing talk -- "I know I'm afraid ___; when I ask you __ or tell you ___, what are you afraid will happen?" . Other recommendations/advice as noted above . Continue to utilize previously learned skills ad lib . Maintain medication as prescribed and work faithfully with relevant prescriber(s) if any changes are desired or seem indicated . Call the clinic on-call service, present to ER, or call 911 if any life-threatening psychiatric crisis Return in about 2 weeks (around 04/02/2019) for as scheduled already, set up as teletherapy session.   [name], PhD Sandra Moore, PhD LP Clinical Psychologist, [facility] [name], [place], [place] [address] [phone]

## 2019-03-19 NOTE — Progress Notes (Signed)
Virtual Visit via Telephone Note  I connected with Sandra Burns on 03/19/19 at 10:30 AM EDT by telephone and verified that I am speaking with the correct person using two identifiers.   I discussed the limitations, risks, security and privacy concerns of performing an evaluation and management service by telephone and the availability of in person appointments. I also discussed with the patient that there may be a patient responsible charge related to this service. The patient expressed understanding and agreed to proceed.   History of Present Illness:    Sandra Burns is a 63 year old Caucasian female with a past medical history for asthma, COPD, apparent mitral valve prolapse, atrial fibrillation who had developed dyspnea and ultimately underwent evaluation in the emergency department in December of this past year.  Her d-dimer was slightly positive and she underwent CT angiogram.  It was negative for pulmonary embolism but did show a Mild tree-in-bud nodularity in the right lower lobe.   She has been evaluated by pulmonary [place] who reviewed her films and found that she might have very mild bronchiectatic changes confined to the left and right upper lobe.  Patient has undergone repeat imaging in [place] which again showed some nodularity.  The question of whether she might have Mycobacterium avium was raised  Interim the patient who had not been coughing has been doing all sorts of maneuvers to try to get herself to cough up sputum including sleeping in a recliner and doing other maneuvers to get sputum up such as using a flutter valve.  She was able to get 3 sputum's for submission and 2 of the 3 did not grow any organisms the third grew Mycobacterium avium complex.  She does not seem to cough at all except if she is trying to get sputum up at which she does cheat chiefly at night.  She is without fevers she is without weight loss.  She did however have some  dyspnea on exertion that is been going on for several months.  In talking to her I was not at all convinced that she has clear-cut active Mycobacterium avium infection.  Certainly she did snot meet ATS guidelines definition for such infection.  I have discussed with her that even if she did have evidence of Mycobacterium infection her symptom complex to me would not be wanting which I would be pushing hard for her to be on therapy.  After careful discussion I felt it prudent to not initiate therapy but to revisit how she is doing in roughly 3 to 4 months time.  We did contemplate repeating the CT scan 6 months after the most recent one which was done in January.   Since then continues to have more cough at night and in particular in the recliner in certain posittions.  She had one evening x 1 week with night sweats one week after I spoke with her.  She is doing the "airway clearance breathing" technique to get sputum up.  It sounds as if she is doing quite stably.  She is not at all anxious to start therapies for Mycobacterium avium infection and has read up about it quite a bit.  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  Does not sound like she has much in the way of weight loss or other systemic symptoms that are troubling.  Radiographic changes do not seem to be showing significant damage to the lungs.  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       Past Medical History:  Diagnosis Date  . Arrhythmia   . Arthritis   . Asthma   . Back pain   . Chronic obstructive airway disease ([place])   . Diverticulosis   . Fibromyalgia   . Fibromyalgia   . GERD (gastroesophageal reflux disease)   . Irritable bowel syndrome   . Kidney stone   . Lupus ([place])    "suspected"  . Migraine   . Mitral valve prolapse   . 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 Osteoporosis    . Paroxysmal A-fib ([place])   . Psoriatic arthritis ([place])   . Raynaud's disease   . Reactive airway disease   . S/P chemotherapy, time since greater than 12 weeks 1984   cervical ca    Past Surgical History:  Procedure Laterality Date  . ABDOMINAL HYSTERECTOMY    . BACK SURGERY    . BREAST BIOPSY Left 20+ YRS AGO   EXCISIONAL - NEG  . CHOLECYSTECTOMY    . OOPHORECTOMY      Family History  Problem Relation Age of Onset  . Breast cancer Neg Hx       Social History   Socioeconomic History  . Marital status: Married    Spouse name: Not on file  . Number of children: Not on file  . Years of education: Not on file  . Highest education level: Not on file  Occupational History  . Occupation: Therapist, sports    Comment: Disabled since '09 Cardiopulmonary reasons  Social Needs  . Financial resource strain: Not on file  . Food insecurity    Worry: Not on file    Inability: Not on file  . Transportation needs    Medical: Not on file    Non-medical: Not on file  Tobacco Use  . Smoking status: Former Smoker    Quit date: 07/13/1981    Years since quitting: 37.7  . Smokeless tobacco: Never Used  Substance and Sexual Activity  . Alcohol use: Not Currently    Comment: drank as a teen.  Not since  . Drug use: Not Currently    Comment: pot as a teen  . Sexual activity: Yes  Lifestyle  . Physical activity    Days per week: Not on file    Minutes per session: Not on file  . Stress: Not on file  Relationships  . Social Herbalist on phone: Not on file    Gets together: Not on file    Attends religious service: Not on file    Active member of club or organization: Not on file    Attends meetings of clubs or organizations: Not on file    Relationship status: Not on file  Other Topics Concern  . Not on file  Social History Narrative  . Not on file    Allergies  Allergen Reactions  . Albuterol Other (See Comments)    Atrial fibrillation Atrial fibrillation   .  Amoxicillin Itching and Swelling    Has patient had a PCN reaction causing immediate rash, facial/tongue/throat swelling, SOB or lightheadedness with hypotension: Yes Has patient had a PCN reaction causing severe rash involving mucus membranes or skin necrosis: Yes Has patient had a PCN reaction that required hospitalization: Yes Has patient had a PCN reaction occurring within the last 10 years: Yes If all of the above answers are "NO", then may proceed with  Cephalosporin use.   . Cephalosporins Itching and Rash    Rash and itching  . Ciprofloxacin Swelling  . Flagyl [Metronidazole] Dermatitis and Other (See Comments)    Other Reaction: SKIN SLOUGHING  . Macrobid [Nitrofurantoin Macrocrystal] Itching  . Sulfa Antibiotics Itching  . Vancomycin Shortness Of Breath  . Vicodin [Hydrocodone-Acetaminophen] Shortness Of Breath  . Zithromax [Azithromycin] Other (See Comments)    GI upset, C-diff  . Bacitracin-Neomycin-Polymyxin Itching and Swelling    Pt got the issue for the eye lids after instill the eyes drops.   . Tobramycin Itching and Swelling    Pt got the issue after instill the eyes drops  . Decongest-Aid [Pseudoephedrine] Palpitations  . Levofloxacin Rash    Itchy, swelling  . Prednisone Palpitations     Current Outpatient Medications:  .  ALPRAZolam (XANAX) 0.5 MG tablet, 1.5 po q am, 1.5 qd at lunch, 1 po qd at dinner, 1.5 qhs all prn., Disp: 165 tablet, Rfl: 3 .  Artificial Tear Ointment (REFRESH P.M. OP), Place 1 application into both eyes at bedtime. Apply eye gel to both eyelids, Disp: , Rfl:  .  aspirin 81 MG chewable tablet, Chew 81 mg by mouth daily., Disp: , Rfl:  .  atenolol (TENORMIN) 25 MG tablet, Take 12.5 mg by mouth 2 (two) times daily. , Disp: , Rfl:  .  budesonide (PULMICORT) 180 MCG/ACT inhaler, Inhale 2 puffs into the lungs 2 (two) times daily. , Disp: , Rfl:  .  dicyclomine (BENTYL) 20 MG tablet, Take 1 tablet (20 mg total) by mouth 2 (two) times daily.  (Patient taking differently: Take 20 mg by mouth daily as needed for spasms. ), Disp: 20 tablet, Rfl: 0 .  fluticasone (FLONASE) 50 MCG/ACT nasal spray, Place 1 spray into both nostrils daily as needed for allergies., Disp: , Rfl: 6 .  ipratropium (ATROVENT HFA) 17 MCG/ACT inhaler, Inhale 2 puffs into the lungs 4 (four) times daily., Disp: , Rfl:  .  Multiple Vitamin (MULTIVITAMIN WITH MINERALS) TABS, Take 1 tablet by mouth daily., Disp: , Rfl:  .  pantoprazole (PROTONIX) 40 MG tablet, Take 40 mg by mouth daily as needed (GERD). For stomach , Disp: , Rfl:  .  Polyvinyl Alcohol-Povidone (REFRESH OP), Place 1 drop into both eyes daily as needed (dry eyes)., Disp: , Rfl:    Review of systems as noted in history of present illness and otherwise negative on 12 point review of systems    Observations/Objective:  Asthma and active airway disease: Sounds and some of this may have been precipitated by episode of allergic bronchopulmonary aspergillosis if I understood her correctly but this is really not an issue now other than her reactive airway disease itself for which she is on high-dose corticosteroid inhalers and beta agonist therapy  Mild bronchiectasis which seems confined to a few lobes of the lung  Positive culture for Mycobacterium avium which may represent a colonizer versus a true pathogen though she is not clearly showing sufficient evidence to me that this is the case  Dyspnea on exertion  Assessment and Plan:  Mycobacterium avium positive culture: She could certainly have an infection with this and does have some radiographic findings and does have significant sputum production though months of this is also her efforts to get the sputum out of her lungs she does not have anything and from a clinical standpoint that is pressing to make [place] want to initiate therapy at this point in time.  She herself is also  not interested initiating therapy. It was more prudent to follow her clinically  and see how she is doing before considering initiating therapy against this organism.  I think 1 could get imaging to continue to follow the lung structure and that might be potentially part of the equation but I would be judicious with the scans and I actually would consider not doing one in 6 months but rather about a year from her last scan  Mild bronchiectasis: Defer to pulmonary regards to management of this.  Dyspnea: Could be multifactorial and will defer to her pulmonary physician her primary care physician and her cardiologist.  Follow Up Instructions:    I discussed the assessment and treatment plan with the patient. The patient was provided an opportunity to ask questions and all were answered. The patient agreed with the plan and demonstrated an understanding of the instructions.   The patient was advised to call back or seek an in-person evaluation if the symptoms worsen or if the condition fails to improve as anticipated.  I provided 30 minutes of non-face-to-face time during this encounter.   Alcide Evener, MD

## 2019-03-28 ENCOUNTER — Telehealth: Payer: Self-pay | Admitting: Physician Assistant

## 2019-03-28 ENCOUNTER — Other Ambulatory Visit: Payer: Self-pay

## 2019-03-28 ENCOUNTER — Ambulatory Visit (INDEPENDENT_AMBULATORY_CARE_PROVIDER_SITE_OTHER): Payer: Medicare HMO | Admitting: Psychiatry

## 2019-03-28 ENCOUNTER — Other Ambulatory Visit: Payer: Self-pay | Admitting: Physician Assistant

## 2019-03-28 DIAGNOSIS — G43919 Migraine, unspecified, intractable, without status migrainosus: Secondary | ICD-10-CM

## 2019-03-28 DIAGNOSIS — I499 Cardiac arrhythmia, unspecified: Secondary | ICD-10-CM

## 2019-03-28 DIAGNOSIS — F411 Generalized anxiety disorder: Secondary | ICD-10-CM | POA: Diagnosis not present

## 2019-03-28 DIAGNOSIS — F3341 Major depressive disorder, recurrent, in partial remission: Secondary | ICD-10-CM

## 2019-03-28 DIAGNOSIS — A310 Pulmonary mycobacterial infection: Secondary | ICD-10-CM | POA: Diagnosis not present

## 2019-03-28 DIAGNOSIS — R69 Illness, unspecified: Secondary | ICD-10-CM | POA: Diagnosis not present

## 2019-03-28 DIAGNOSIS — Z630 Problems in relationship with spouse or partner: Secondary | ICD-10-CM

## 2019-03-28 DIAGNOSIS — Z62820 Parent-biological child conflict: Secondary | ICD-10-CM

## 2019-03-28 NOTE — Progress Notes (Addendum)
Psychotherapy Progress Note [facility] [name], PhD LP  [name]     MRN: [id number]     Therapy format: Individual psychotherapy Date: 03/28/2019     Start: 1:10p Stop: 2:00p Time Spent: 50 min Location: telehealth   Telehealth visit -- I connected with this patient by an approved telecommunication method (video), with her informed consent, and verifying identity and patient privacy.  I was located at my office and patient at her home.  As needed, we discussed the limitations, risks, and security and privacy concerns associated with telehealth service, including the availability and conditions which currently govern in-person appointments and the possibility that 3rd-party payment may not be fully guaranteed and she may be responsible for charges.  After she indicated understanding, we proceeded with the session.  Also discussed treatment planning, as needed, including ongoing verbal agreement with the plan, the opportunity to ask and answer all questions, her demonstrated understanding of instructions, and her readiness to call the office should symptoms worsen or she feels she is in a crisis state and needs more immediate and tangible assistance.  Session narrative (presenting needs, interim history, self-report of stressors and symptoms, applications of prior therapy, status changes, and interventions made in session) Hard week, more down.  Aware she is affected by the sheer volume of uncertainty she experiences these days.  Follows a period of more intense anxiety, and an odd cardiac event, a jolt in her chest.  Took vitals, O2 fine, but RHR 50 and elevated BP.  Hx of overnight 40 readings.  Attributed some to stress of a conflictual day with Sandra Burns.  Some sxs suggestive of CVA, for which she is higher genetic risk, but possible migraine.  Had dilemma about taking beta blocker vs not.  Split the difference, took half a beta blocker, woke up with  normal HR, relieved.  Cites anxiety over society and political happenings, spillover stress from friends dumping,  And husband's chronic unease these days.  Also approaching an unintentional lapse in Alprazolam, which alarms her due to seizure history and cardiac rhythm issues that could be reactivated by withdrawal.  Has   Tension with husband over how much to watch, and talk about, the news.  He relieves himself by sharing/processing, she by slowing the flow.  Acknowledged and interpreted it's a classic problem for couples establishing 2-way respect for each other's sensibilities, discussed need to get "traffic rules" agreed for time spent and whose turn it may be if they can't both get what they want.  Has had a decent post-processing conversation with husband about stress and communication habits.  Very productive, sympathetic, respectful, separated a lot of wheat from chaff in their perceptions of each other.  Husband pleased to find that Sandra Burns is interpretive and evenhanded, willing to make suggestions to PT, not just automatically take her side.  Considerably more open and vulnerable with her since.  Helpful to have asked him if he knows her better than certain reactive statements would suggest.  Concern for husband seeming to relapse in reflex-sharing the news.  Discussed interpretations of his behavior, ongoing concern for his mental state and possible early signs of dementia.  Continues to care for herself with hobbies, genealogy.  Some respite while continuing to deal with tiring and confining illness plus pandemic risk.  Therapeutic modalities: Assertiveness/Communication, Solution-Oriented/Positive Psychology and Ego-Supportive  Mental Status/Observations:  Appearance:   Casual     Behavior:  Appropriate  Motor:  Normal  Speech/Language:  Clear and Coherent and mild slowing  Affect:  wearied, responsive  Mood:  dysthymic  Thought process:  normal  Thought content:    WNL   Sensory/Perceptual disturbances:    WNL  Orientation:  grossly intact  Attention:  Good  Concentration:  Good  Memory:  WNL  Insight:    Good  Judgment:   Good  Impulse Control:  Good   Risk Assessment: Danger to Self: No Self-injurious Behavior: No Danger to Others: No Physical Aggression / Violence: No Duty to Warn: No Access to Firearms a concern: No  Assessment of progress:  progressing  Diagnosis:   ICD-10-CM   1. Recurrent major depressive disorder, in partial remission ([name])  F33.41   2. Generalized anxiety disorder  F41.1   3. Relationship problem between partners  Z63.0   4. Relationship problem between parent and child  Z62.820   5. Mycobacterium avium complex ([name])  A31.0   6. Intractable migraine without status migrainosus, unspecified migraine type  G43.919    recent flareup  7. Cardiac arrhythmia, unspecified cardiac arrhythmia type  I49.9    Plan:  . Continue use of constructive conflict tactics . Other recommendations/advice as noted above . Continue to utilize previously learned skills ad lib . Maintain medication as prescribed and work faithfully with relevant prescriber(s) if any changes are desired or seem indicated . Call the clinic on-call service, present to ER, or call 911 if any life-threatening psychiatric crisis Return in about 2 weeks (around 04/11/2019) for set up as teletherapy session, as scheduled already.   [name], PhD [name], PhD LP Clinical Psychologist, [facility] [address], [place], [place] [address] [phone]

## 2019-03-28 NOTE — Telephone Encounter (Signed)
error 

## 2019-04-01 ENCOUNTER — Telehealth (INDEPENDENT_AMBULATORY_CARE_PROVIDER_SITE_OTHER): Payer: Medicare HMO | Admitting: Psychiatry

## 2019-04-01 DIAGNOSIS — R69 Illness, unspecified: Secondary | ICD-10-CM | POA: Diagnosis not present

## 2019-04-01 DIAGNOSIS — F4323 Adjustment disorder with mixed anxiety and depressed mood: Secondary | ICD-10-CM | POA: Diagnosis not present
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
## 2019-04-04 ENCOUNTER — Ambulatory Visit (INDEPENDENT_AMBULATORY_CARE_PROVIDER_SITE_OTHER): Payer: Medicare HMO | Admitting: Psychiatry

## 2019-04-04 ENCOUNTER — Other Ambulatory Visit: Payer: Self-pay

## 2019-04-04 DIAGNOSIS — F422 Mixed obsessional thoughts and acts: Secondary | ICD-10-CM

## 2019-04-04 DIAGNOSIS — F331 Major depressive disorder, recurrent, moderate: Secondary | ICD-10-CM | POA: Diagnosis not present

## 2019-04-04 DIAGNOSIS — Z630 Problems in relationship with spouse or partner: Secondary | ICD-10-CM | POA: Diagnosis not present

## 2019-04-04 DIAGNOSIS — Z8659 Personal history of other mental and behavioral disorders: Secondary | ICD-10-CM | POA: Diagnosis not present

## 2019-04-04 DIAGNOSIS — Z638 Other specified problems related to primary support group: Secondary | ICD-10-CM

## 2019-04-04 DIAGNOSIS — R69 Illness, unspecified: Secondary | ICD-10-CM | POA: Diagnosis not present

## 2019-04-04 DIAGNOSIS — A310 Pulmonary mycobacterial infection: Secondary | ICD-10-CM

## 2019-04-04 DIAGNOSIS — F411 Generalized anxiety disorder: Secondary | ICD-10-CM | POA: Diagnosis not present

## 2019-04-04 DIAGNOSIS — Z636 Dependent relative needing care at home: Secondary | ICD-10-CM

## 2019-04-04 DIAGNOSIS — J45909 Unspecified asthma, uncomplicated: Secondary | ICD-10-CM
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
## 2019-04-05 ENCOUNTER — Ambulatory Visit (INDEPENDENT_AMBULATORY_CARE_PROVIDER_SITE_OTHER): Payer: Medicare HMO | Admitting: Physician Assistant

## 2019-04-05 ENCOUNTER — Encounter: Payer: Self-pay | Admitting: Physician Assistant

## 2019-04-05 ENCOUNTER — Other Ambulatory Visit: Payer: Self-pay

## 2019-04-05 ENCOUNTER — Encounter

## 2019-04-05 DIAGNOSIS — F3341 Major depressive disorder, recurrent, in partial remission: Secondary | ICD-10-CM | POA: Diagnosis not present

## 2019-04-05 DIAGNOSIS — Z636 Dependent relative needing care at home: Secondary | ICD-10-CM | POA: Diagnosis not present

## 2019-04-05 DIAGNOSIS — R69 Illness, unspecified: Secondary | ICD-10-CM | POA: Diagnosis not present

## 2019-04-05 DIAGNOSIS — F411 Generalized anxiety disorder: Secondary | ICD-10-CM | POA: Diagnosis not present
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
## 2019-04-09 ENCOUNTER — Ambulatory Visit (INDEPENDENT_AMBULATORY_CARE_PROVIDER_SITE_OTHER): Payer: Medicare HMO | Admitting: Psychiatry

## 2019-04-09 DIAGNOSIS — Z62820 Parent-biological child conflict: Secondary | ICD-10-CM

## 2019-04-09 DIAGNOSIS — Z630 Problems in relationship with spouse or partner: Secondary | ICD-10-CM

## 2019-04-09 DIAGNOSIS — G4700 Insomnia, unspecified: Secondary | ICD-10-CM | POA: Diagnosis not present

## 2019-04-09 DIAGNOSIS — F411 Generalized anxiety disorder: Secondary | ICD-10-CM

## 2019-04-09 DIAGNOSIS — Z636 Dependent relative needing care at home: Secondary | ICD-10-CM

## 2019-04-09 DIAGNOSIS — Z8659 Personal history of other mental and behavioral disorders: Secondary | ICD-10-CM | POA: Diagnosis not present

## 2019-04-09 DIAGNOSIS — F422 Mixed obsessional thoughts and acts: Secondary | ICD-10-CM | POA: Diagnosis not present

## 2019-04-09 DIAGNOSIS — F331 Major depressive disorder, recurrent, moderate: Secondary | ICD-10-CM | POA: Diagnosis not present

## 2019-04-09 DIAGNOSIS — R69 Illness, unspecified: Secondary | ICD-10-CM | POA: Diagnosis not present

## 2019-04-09 DIAGNOSIS — A310 Pulmonary mycobacterial infection: Secondary | ICD-10-CM | POA: Diagnosis not present
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
## 2019-04-12 ENCOUNTER — Ambulatory Visit (INDEPENDENT_AMBULATORY_CARE_PROVIDER_SITE_OTHER): Payer: Medicare HMO | Admitting: Psychiatry

## 2019-04-12 ENCOUNTER — Ambulatory Visit: Payer: Medicare HMO | Admitting: Psychiatry

## 2019-04-12 ENCOUNTER — Other Ambulatory Visit: Payer: Self-pay

## 2019-04-12 DIAGNOSIS — F4323 Adjustment disorder with mixed anxiety and depressed mood: Secondary | ICD-10-CM

## 2019-04-12 DIAGNOSIS — Z8659 Personal history of other mental and behavioral disorders: Secondary | ICD-10-CM

## 2019-04-12 DIAGNOSIS — R69 Illness, unspecified: Secondary | ICD-10-CM | POA: Diagnosis not present

## 2019-04-12 DIAGNOSIS — J45909 Unspecified asthma, uncomplicated: Secondary | ICD-10-CM | POA: Diagnosis not present

## 2019-04-12 DIAGNOSIS — R300 Dysuria: Secondary | ICD-10-CM | POA: Diagnosis not present

## 2019-04-12 DIAGNOSIS — F422 Mixed obsessional thoughts and acts: Secondary | ICD-10-CM

## 2019-04-12 DIAGNOSIS — Z636 Dependent relative needing care at home: Secondary | ICD-10-CM

## 2019-04-12 DIAGNOSIS — A310 Pulmonary mycobacterial infection: Secondary | ICD-10-CM

## 2019-04-12 DIAGNOSIS — F411 Generalized anxiety disorder: Secondary | ICD-10-CM

## 2019-04-12 DIAGNOSIS — R102 Pelvic and perineal pain: Secondary | ICD-10-CM | POA: Diagnosis not present

## 2019-04-12 NOTE — Progress Notes (Signed)
Psychotherapy Progress Note [facility] Sandra Moore, PhD LP  [name]     MRN: [id number]     Therapy format: Individual psychotherapy Date: 04/12/2019     Start: 2:09p Stop: 3:00p Time Spent: 51 min Location: telehealth   Telehealth visit -- I connected with this patient by an approved telecommunication method (video), with her informed consent, and verifying identity and patient privacy.  I was located at my office and patient at her home.  As needed, we discussed the limitations, risks, and security and privacy concerns associated with telehealth service, including the availability and conditions which currently govern in-person appointments and the possibility that 3rd-party payment may not be fully guaranteed and she may be responsible for charges.  After she indicated understanding, we proceeded with the session.  Also discussed treatment planning, as needed, including ongoing verbal agreement with the plan, the opportunity to ask and answer all questions, her demonstrated understanding of instructions, and her readiness to call the office should symptoms worsen or she feels she is in a crisis state and needs more immediate and tangible assistance.  Session narrative (presenting needs, interim history, self-report of stressors and symptoms, applications of prior therapy, status changes, and interventions made in session) Continues to keep vigil with husband in guarded condition, in hospital with pancreatic damage and necrosis.  Dilemma over moving him to [facility] vs. trusting current care and/or starting to accept that he is terminal, barring a miracle.  TPN treatment strategy proposed now.  Conflicting signals from husband how he is faring, conflicting signs what he is tolerating, conflicting signals from nursing and doctor about his prognosis.  Needs clear help making that call, will pursue the hospitalist.  Aware he has other signs, like diarrhea,  pitting edema, don't look good.  Believes nurses and hospital are glossing over things out of lawsuit anxiety, craves for the doctor in charge of his care today to just be straight with her, don't worry about managing her apparent feelings or understandable anger at indications of malpractice by others earlier.  Trouble is lack of response, despite assurances nursing unit will get doctor's word and get back with her.  Chafing for guidance.   Allowed to make calls during session to try to reach doctor on duty; eventually heard from husband's nurse, who reports adjustments in tube feeding and order to try clear liquids, TPN on hold.  Witnessed phone call, on speaker, PT able to ask questions and get cogent report of medical decision-making, keep a professional tone, and credible word given by nurse that she will call doctor and his stool will be checked for C diff.  Processed the call afterward, countering tendency to review cynically, reviewed for constructive and responsible actions taken by the nurse.  Resolved to contact husband, find out his feelings and wishes, hold for physician contact.    Personally, also dealing with new flank pain that mimics a kidney stone.  Support provided.  Therapeutic modalities: Cognitive Behavioral Therapy, Assertiveness/Communication, Solution-Oriented/Positive Psychology and Ego-Supportive  Mental Status/Observations:  Appearance:   Casual     Behavior:  Appropriate  Motor:  Normal  Speech/Language:   Clear and Coherent  Affect:  Appropriate  Mood:  dysthymic and irritable  Thought process:  normal  Thought content:    WNL  Sensory/Perceptual disturbances:    WNL  Orientation:  grossly intact  Attention:  Good  Concentration:  Good  Memory:  WNL  Insight:    Good  Judgment:  Good  Impulse Control:  Good   Risk Assessment: Danger to Self: No Self-injurious Behavior: No Danger to Others: No Physical Aggression / Violence: No Duty to Warn: No Access to  Firearms a concern: No  Assessment of progress:  progressing  Diagnosis:   ICD-10-CM   1. Generalized anxiety disorder  F41.1   2. Caregiver stress  Z63.6   3. History of posttraumatic stress disorder (PTSD)  Z86.59   4. Mixed obsessional thoughts and acts  F42.2   5. 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 Asthma, unspecified asthma severity, unspecified whether complicated, unspecified whether persistent  J45.909   7. Adjustment disorder with mixed anxiety and depressed mood  F43.23     Plan:  . Self-monitor for overly cynical interpretations, emphasize constructively asking what she needs done . Emphasize ask questions > jump to conclusions . Other recommendations/advice as noted above . Continue to utilize previously learned skills ad lib . Maintain medication as prescribed and work faithfully with relevant prescriber(s) if any changes are desired or seem indicated . Call the clinic on-call service, present to ER, or call 911 if any life-threatening psychiatric crisis Return in about 1 week (around 04/19/2019) for teletherapy.   [name], PhD Sandra Moore, PhD LP Clinical Psychologist, [facility] [address] [place], [place] [address] [phone]

## 2019-04-16 ENCOUNTER — Ambulatory Visit (INDEPENDENT_AMBULATORY_CARE_PROVIDER_SITE_OTHER): Payer: Medicare HMO | Admitting: Psychiatry

## 2019-04-16 DIAGNOSIS — F4310 Post-traumatic stress disorder, unspecified: Secondary | ICD-10-CM

## 2019-04-16 DIAGNOSIS — Z8659 Personal history of other mental and behavioral disorders: Secondary | ICD-10-CM

## 2019-04-16 DIAGNOSIS — Z636 Dependent relative needing care at home: Secondary | ICD-10-CM

## 2019-04-16 DIAGNOSIS — A310 Pulmonary mycobacterial infection: Secondary | ICD-10-CM | POA: Diagnosis not present

## 2019-04-16 DIAGNOSIS — F331 Major depressive disorder, recurrent, moderate: Secondary | ICD-10-CM

## 2019-04-16 DIAGNOSIS — G8929 Other chronic pain: Secondary | ICD-10-CM

## 2019-04-16 DIAGNOSIS — R69 Illness, unspecified: Secondary | ICD-10-CM | POA: Diagnosis not present

## 2019-04-16 DIAGNOSIS — F411 Generalized anxiety disorder: Secondary | ICD-10-CM | POA: Diagnosis not present

## 2019-04-16 NOTE — Progress Notes (Signed)
Psychotherapy Progress Note [facility] [name], PhD LP  Patient ID: Jaclynn Laumann     MRN: [id number]     Therapy format: Individual psychotherapy Date: 04/16/2019     Start: 11:10a Stop: 12:15p Time Spent: 65 min Location: telehealth   Telehealth visit -- I connected with this patient by an approved telecommunication method (video), with her informed consent, and verifying identity and patient privacy.  I was located at my office and patient at her home.  As needed, we discussed the limitations, risks, and security and privacy concerns associated with telehealth service, including the availability and conditions which currently govern in-person appointments and the possibility that 3rd-party payment may not be fully guaranteed and she may be responsible for charges.  After she indicated understanding, we proceeded with the session.  Also discussed treatment planning, as needed, including ongoing verbal agreement with the plan, the opportunity to ask and answer all questions, her demonstrated understanding of instructions, and her readiness to call the office should symptoms worsen or she feels she is in a crisis state and needs more immediate and tangible assistance.  Session narrative (presenting needs, interim history, self-report of stressors and symptoms, applications of prior therapy, status changes, and interventions made in session) [name] continues in hospital.  Liver function improved, still possible his pancrease will not.  On artificial enzymes, tube feedings, and persistent hiccups, more conversant, but seeing vast difference in how he portrays his suffering to her and to care workers.  Further contradictory messages, like hospitalist telling him he'll be out in a few days, while case nurse saying she has no such word.  Has been able to be clear with case nurse about expectations and conditions at home affecting discharge decisions.  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    PT curbed her tire avoiding an impulsive driver pulling out when she went to fetch groceries curbside, and limped home, ruining a tire and requiring a tow and service.  Now stymied for how to get the car back.  Medical staff keep giving double-edged messages about prognosis.  Disturbed sleep exacerbates tachycardia.  Has been in touch with her secondary POA to advise and alert her to possible need to use the spare key, frustrated to find she isn't sure she knows where to find the key, rethinking who to entrust with this responsibility.  Has now duplicated with friend [name] (fellow PT), entrusting her with a key and knowledge of where the will is, knows she will keep up with it.  Another friend Claiborne Billings abusing prescriptions, finds her exhausting, dramatic and chaotic, not even-tempered and focused.  Disappointed in many things, and people, even [name] disappointed her by offering to pick up the car and then several times second-guessing when to schedule it, hit her wrong for [name] to defer about getting up one morning.  Discussed and reinterpreted PT's irritation as trauma-driven transference, relatively mild, and within reach to understand based on things he knows already.  Affirmed discretion   Irritated also by a [name] and [facility] associate telling others without basis that PT does not want phone calls.  Has addressed it to her report.    PT hitting the bed 2 hrs earlier to rest well and be ready for hospital communications.  Trying to get 2 high-protein Boost drinks in a day  and eat something else, like mashed potatoes, cereal, but irritated her throat with a surprisingly hot bite of food.  Other friends unavailable last night, exhausted and alone, took Xanax and hit the bed.  Found the hospital chaplain helpful, speaking the unspeakable,  entertaining the possibility of loss, and getting ahead on what may need planning if/when he comes home.    Struggling with nights -- evening winddown has been her time with [name], and it's gone now.  Could watch some recorded shows but would feel guilty doing so without [name].  Aware she has thought before of what and how to cope if she loses her husband.  Fighting off intrusive thoughts that she has to survive to take care of [name], but able to fall back on a reassuring sense of fate, that God has her days numbered and is in control.  Just so desolate last night, was a s close to panic as she has been in years, eventually surrendered in prayer and gave over for the night. Has to keep the phone available in case of call from hospital, so bank notices and other low-priority notifications come up, disturbing her.  Offered her an imagery-based breathing exercise, and affirmed her old progressive muscle relaxation technique before bed.    Alludes to OCD and phobias, a number of them modeled by father, around food and driving.  Can struggle with eating under stress, partly for history of food poisoning.  Has lost 8 lbs in 2 weeks.    Also aware that her quarantine has already been half a year, given her lung disorder and the winter before COVID.  It is wearying, and she is hit over and over with     Therapeutic modalities: Cognitive Behavioral Therapy, Ego-Supportive and faith-sensitive  Mental Status/Observations:  Appearance:   Casual     Behavior:  Appropriate  Motor:  Normal  Speech/Language:   Clear and Coherent  Affect:  Appropriate  Mood:  anxious and dysthymic  Thought process:  normal  Thought content:    WNL  Sensory/Perceptual disturbances:    WNL  Orientation:  grossly intact  Attention:  Good  Concentration:  Good  Memory:  WNL  Insight:    Good  Judgment:   Good  Impulse Control:  Good   Risk Assessment: Danger to Self: No Self-injurious Behavior: No Danger to Others:  No Physical Aggression / Violence: No Duty to Warn: No Access to Firearms a concern: No  Assessment of progress:  stabilized  Diagnosis:   ICD-10-CM   1. Generalized anxiety disorder  F41.1   2. Caregiver stress  Z63.6   3. Moderate episode of recurrent major depressive disorder (HCC)  F33.1   4. History of posttraumatic stress disorder (PTSD)  Z86.59   5. Other chronic pain  G89.29    complex -- spinal, arthritis, episodic migraines  6. 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 PTSD (post-traumatic stress disorder)  F43.10     Plan:  . Continue working faithfully with husband's healthcare and leaning on friends at discretion . Self-affirm dismay at friends can be powered by old trauma and current stress -- stand ready to ask questions before assuming negative motives or self-interest . Other recommendations/advice as noted above . Continue to utilize previously learned skills ad lib . Maintain medication as prescribed and work faithfully with relevant prescriber(s) if any changes are desired or seem indicated . Call the clinic on-call service, present to ER, or call 911 if any life-threatening psychiatric  crisis Return in about 3 days (around 04/19/2019) for as scheduled already, set up as teletherapy session.   [name], PhD [name], PhD LP Clinical Psychologist, [name] [address] [place], [place] [address] [phone]

## 2019-04-18 DIAGNOSIS — R8290 Unspecified abnormal findings in urine: Secondary | ICD-10-CM | POA: Diagnosis not present

## 2019-04-18 DIAGNOSIS — J302 Other seasonal allergic rhinitis: Secondary | ICD-10-CM | POA: Diagnosis not present

## 2019-04-18 DIAGNOSIS — R399 Unspecified symptoms and signs involving the genitourinary system: Secondary | ICD-10-CM | POA: Diagnosis not present

## 2019-04-18 DIAGNOSIS — I341 Nonrheumatic mitral (valve) prolapse: Secondary | ICD-10-CM | POA: Diagnosis not present

## 2019-04-18 DIAGNOSIS — R000 Tachycardia, unspecified: Secondary | ICD-10-CM | POA: Diagnosis not present

## 2019-04-18 DIAGNOSIS — J449 Chronic obstructive pulmonary disease, unspecified: Secondary | ICD-10-CM | POA: Diagnosis not present

## 2019-04-18 DIAGNOSIS — B4481 Allergic bronchopulmonary aspergillosis: Secondary | ICD-10-CM | POA: Diagnosis not present

## 2019-04-18 DIAGNOSIS — I471 Supraventricular tachycardia: Secondary | ICD-10-CM | POA: Diagnosis not present

## 2019-04-19 ENCOUNTER — Other Ambulatory Visit: Payer: Self-pay

## 2019-04-19 ENCOUNTER — Ambulatory Visit (INDEPENDENT_AMBULATORY_CARE_PROVIDER_SITE_OTHER): Payer: Medicare HMO | Admitting: Psychiatry

## 2019-04-19 DIAGNOSIS — F411 Generalized anxiety disorder: Secondary | ICD-10-CM | POA: Diagnosis not present

## 2019-04-19 DIAGNOSIS — Z79899 Other long term (current) drug therapy: Secondary | ICD-10-CM | POA: Diagnosis not present

## 2019-04-19 DIAGNOSIS — A310 Pulmonary mycobacterial infection: Secondary | ICD-10-CM | POA: Diagnosis not present

## 2019-04-19 DIAGNOSIS — Z8659 Personal history of other mental and behavioral disorders: Secondary | ICD-10-CM | POA: Diagnosis not present

## 2019-04-19 DIAGNOSIS — Z636 Dependent relative needing care at home: Secondary | ICD-10-CM

## 2019-04-19 DIAGNOSIS — F331 Major depressive disorder, recurrent, moderate: Secondary | ICD-10-CM | POA: Diagnosis not present

## 2019-04-19 DIAGNOSIS — R69 Illness, unspecified: Secondary | ICD-10-CM | POA: Diagnosis not present

## 2019-04-19 NOTE — Progress Notes (Signed)
Psychotherapy Progress Note [facility] [name], PhD LP  [name]     MRN: [id number]     Therapy format: Individual psychotherapy Date: 04/19/2019     Start: 2:10p Stop: 3:14p Time Spent: 64 min Location: telehealth   Telehealth visit -- I connected with this patient by an approved telecommunication method (video), with her informed consent, and verifying identity and patient privacy.  I was located at my office and patient at her home.  As needed, we discussed the limitations, risks, and security and privacy concerns associated with telehealth service, including the availability and conditions which currently govern in-person appointments and the possibility that 3rd-party payment may not be fully guaranteed and she may be responsible for charges.  After she indicated understanding, we proceeded with the session.  Also discussed treatment planning, as needed, including ongoing verbal agreement with the plan, the opportunity to ask and answer all questions, her demonstrated understanding of instructions, and her readiness to call the office should symptoms worsen or she feels she is in a crisis state and needs more immediate and tangible assistance.  Session narrative (presenting needs, interim history, self-report of stressors and symptoms, applications of prior therapy, status changes, and interventions made in session) Sandra Burns released home Wednesday.  PT's HR high, acting up, has had home monitoring, got some relief with rest.  Has jaw pain but believes it to be TMJ from known clenching, not cardiac issue.  Today in a lower gear, emotionally.  Phone rang during session, PT startled, says she hates hearing the phone, even if it is what this one is about -- insurance program setting up to deliver 14 nutritious frozen meals.  Despite her impaired appetite, managing nutrition for husband, Boost helpful, nibbling for herself.  Husband requires pancreatic  enzymes, which cost $270 for 10 days and is somewhat difficult to find.  Patient assistance available.  Was a little miffed at his GI doc for joking to husband that the docs are  Going on vacation, so they're "letting" him, too.  Affirmed keeping things simple, as able.  Personally, emphasizing doing things more slowly and deliberately, and accepting "good enough" for things like laundry and dishes, to avoid provoking tachycardia/arrhythmia.  Husband seems better off than he was last time he was discharged, sees him challenged to adjust to home, and still needing recovery, and antsy in his usual environment unable to have his usual habits.  Affirmed permission to modify, permission to settle for "good enough" in all things taking care for him and herself.  Clear now that she has been stressed much more by the ineptness of their clinic than by caregiving responsibilities.  Recalls hx of being on high-dose Xanax under [name], MD, tempted to fel like a failure for still being on it but understands the value of not uprooting too many things at once and working with whatever tolerance she may have.  Formerly irritated with [name] for telling her, when she wanted to port her care to PCP, "You'll never get better."  On Xanax 20 years, knows she is tolerant and that this is not the time to challenge that.  [name] is more ambulatory and able to participate in things.  Seems to have lifted the specter of him dying imminently, but real possibility he will depend on pancreatic enzymes for life and require diligent care and vigilance about health risks and nutrition for same.  Another call in session from their clinic, stopped to answer --  another irritation from the OP clinic, trying to schedule a lab for October when his appt is early August.  2nd call 10 min later about another matter, and difficulty getting staff to understand that he is resting, she is managing a stress-related cardiac condition, and it's OK to  leave a message, not require real-time conversation.  Knows they are jumpy to do customer service with implied threat of lawsuit, just too active for the time.  Similar issue with friend [name] (fellow PT) who has similar compulsive caretaking qualities.  Fighting some with compulsion to get back to [name] after being short with her.  Agreed in session to briefly message [name] to get her word to go by, clearer indication of boundaries, and affirmation of positive regard for her.  Physically, has been taking "baby steps", made trip down for mail, took light trash out, noticed her attitude as more of a trigger.  Affirmed moderation.  Therapeutic modalities: Cognitive Behavioral Therapy  Mental Status/Observations:  Appearance:   Casual     Behavior:  Appropriate  Motor:  Normal  Speech/Language:   Clear and Coherent  Affect:  mildly irritable  Mood:  wearied, mildly irritable  Thought process:  normal  Thought content:    WNL  Sensory/Perceptual disturbances:    WNL  Orientation:  grossly intact  Attention:  Good  Concentration:  Good  Memory:  WNL  Insight:    Good  Judgment:   Good  Impulse Control:  Good   Risk Assessment: Danger to Self: No Self-injurious Behavior: No Danger to Others: No Physical Aggression / Violence: No Duty to Warn: No Access to Firearms a concern: No  Assessment of progress:  stabilized  Diagnosis:   ICD-10-CM   1. Generalized anxiety disorder  F41.1   2. Caregiver stress  Z63.6   3. Moderate episode of recurrent major depressive disorder (HCC)  F33.1   4. History of posttraumatic stress disorder (PTSD)  Z86.59   5. 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 Long term prescription benzodiazepine use  Z79.899    sanctioned & compliant, but tolerant -- to be weaned later   Plan:  . Self-affirm OK to set limits on time others ask of her, let slights go where possible . Other recommendations/advice as noted above . Continue to utilize previously  learned skills ad lib . Maintain medication as prescribed and work faithfully with relevant prescriber(s) if any changes are desired or seem indicated . Call the clinic on-call service, present to ER, or call 911 if any life-threatening psychiatric crisis Return in about 1 week (around 04/26/2019) for set up as teletherapy session.   [name], PhD [name], PhD LP Clinical Psychologist, [facility] [address], [place], [place] [address] [phone]

## 2019-04-29 DIAGNOSIS — I471 Supraventricular tachycardia: Secondary | ICD-10-CM | POA: Diagnosis not present

## 2019-05-02 ENCOUNTER — Ambulatory Visit (INDEPENDENT_AMBULATORY_CARE_PROVIDER_SITE_OTHER): Payer: Medicare HMO | Admitting: Psychiatry

## 2019-05-02 ENCOUNTER — Other Ambulatory Visit: Payer: Self-pay

## 2019-05-02 DIAGNOSIS — Z630 Problems in relationship with spouse or partner: Secondary | ICD-10-CM | POA: Diagnosis not present

## 2019-05-02 DIAGNOSIS — F422 Mixed obsessional thoughts and acts: Secondary | ICD-10-CM | POA: Diagnosis not present

## 2019-05-02 DIAGNOSIS — Z636 Dependent relative needing care at home: Secondary | ICD-10-CM

## 2019-05-02 DIAGNOSIS — A310 Pulmonary mycobacterial infection: Secondary | ICD-10-CM

## 2019-05-02 DIAGNOSIS — R69 Illness, unspecified: Secondary | ICD-10-CM | POA: Diagnosis not present

## 2019-05-02 DIAGNOSIS — F4323 Adjustment disorder with mixed anxiety and depressed mood: Secondary | ICD-10-CM

## 2019-05-02 DIAGNOSIS — Z8659 Personal history of other mental and behavioral disorders: Secondary | ICD-10-CM

## 2019-05-02 DIAGNOSIS — F411 Generalized anxiety disorder: Secondary | ICD-10-CM

## 2019-05-02 NOTE — Progress Notes (Signed)
Psychotherapy Progress Note [facility] [name], PhD LP  Patient ID: Zollie Clemence     MRN: [id number]     Therapy format: Individual psychotherapy Date: 05/02/2019     Start: 4:26p Stop: 5:16p Time Spent: 50 min Location: telehealth   Telehealth visit -- I connected with this patient by an approved telecommunication method (video), with her informed consent, and verifying identity and patient privacy.  I was located at my office and patient at her home.  As needed, we discussed the limitations, risks, and security and privacy concerns associated with telehealth service, including the availability and conditions which currently govern in-person appointments and the possibility that 3rd-party payment may not be fully guaranteed and she may be responsible for charges.  After she indicated understanding, we proceeded with the session.  Also discussed treatment planning, as needed, including ongoing verbal agreement with the plan, the opportunity to ask and answer all questions, her demonstrated understanding of instructions, and her readiness to call the office should symptoms worsen or she feels she is in a crisis state and needs more immediate and tangible assistance.  Session narrative (presenting needs, interim history, self-report of stressors and symptoms, applications of prior therapy, status changes, and interventions made in session) [name] continues at home, convalescing, and many people who offer help not effective.  A neighbor has offered to handle her garbage, sparing some needs to mask up and sanitize.  Neighbors moved, unmasked, contaminating the elevator.  Brought groceries up two flights of steps, and masking at the same time, in her condition, which was hard.  Sick of having to be so careful, and of people who try to make food ignoring the dietary restrictions she has mentioned she and husband have.  Meanwhile, hard to get [name] to be efficient in asking  her for things.  Starting to see H more vacant, less attentive, more impulsive, less verbal.  Seems to be more cognitively compromised and weak now.  PT empathetic, drawing off her 63yo experience recovering for 14 months from spinal fusion.  Wearing down, nevertheless.  Says she is starting to feel resentful, more, while trying to juggle 3 meals, diabetic considerations, and his emotional lability (easily angered by mistakes, easily tearful).  PT was able to validate that he and she both have been traumatized.  Could well be that his pancreas is simply failed and he has to take digestive enzymes for life.  Has offered to husband that he may see [place] himself.    Down to 103#, after getting down to 100# even.  112# before this crisis.  Still using Ensure/Boost.  Cardiology checked protein and other factors, electrolytes good.  Stress-related, not anorexic.  Has been able to let husband take his own shower, today, and pleased he was able to manage it.  Sees him taking spells of being interested in just one kind of food, compulsively, impatiently sometimes.  Possibly OCD s/p traumatic experience and nonapparent brain insult.  Sees him getting overwhelmed with too much info or too many choices, suggesting some degree of cognitive impairment.  Advised to offer him fewer choices, that he will feel better taken care of by looking after his susceptibility to being overwhelmed than he will by her looking after supplying more choices, less stressful to have restrained choice.    Acknowledges anger about an Clinical cytogeneticist for Lys[employer] (and appropriately reported them) -- some struggle feeling guilty, offered no need, it was deserved.  Anger toward son for  making a lot of excuses not to see or help her.  Admitted resentment at moments toward [name], e.g., if he barks at her or gets impatient, knows she is just feeling at-risk so often and worked to her limit.  Appreciates him handling a repair call.  Still  considering a malpractice suit, but knows [name] is easily wearied by the subject.    Probed possibility of respite care, in-home help, even temporary, but [name] is too wary of stranger in the home.  Discussed also possibility of [name] getting supportive counseling and/or joining in sessions as deemed appropriate by PT.  Therapeutic modalities: Cognitive Behavioral Therapy, Solution-Oriented/Positive Psychology and Ego-Supportive  Mental Status/Observations:  Appearance:   Casual     Behavior:  Appropriate  Motor:  Normal  Speech/Language:   Clear and Coherent  Affect:  Appropriate  Mood:  weary  Thought process:  normal  Thought content:    WNL  Sensory/Perceptual disturbances:    WNL  Orientation:  grossly intact  Attention:  Good  Concentration:  Good  Memory:  WNL  Insight:    Good  Judgment:   Good  Impulse Control:  Good   Risk Assessment: Danger to Self: No Self-injurious Behavior: No Danger to Others: No Physical Aggression / Violence: No Duty to Warn: No Access to Firearms a concern: No  Assessment of progress:  progressing  Diagnosis:   ICD-10-CM   1. Generalized anxiety disorder  F41.1   2. Adjustment disorder with mixed anxiety and depressed mood  F43.23   3. Caregiver stress  Z63.6   4. History of posttraumatic stress disorder (PTSD)  Z86.59   5. 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 Mixed obsessional thoughts and acts  F42.2   7. Relationship problem between partners  Z63.0    Plan:  . Reduce number of choices presented to husband to better address  . Maintain adequate nutrition, seek medication advice if sees need for it to help maintain appetite.  Boost/Ensure endorsed meanwhile. . Continuing OK to ask friends to do differently in support . Recommend look into patient assistance for [name]'s digestive enzymes . Other recommendations/advice as noted above . Continue to utilize previously learned skills ad lib . Maintain medication as  prescribed and work faithfully with relevant prescriber(s) if any changes are desired or seem indicated . Call the clinic on-call service, present to ER, or call 911 if any life-threatening psychiatric crisis Return in about 2 weeks (around 05/16/2019) for teletherapy.  [name], PhD [name], PhD LP Clinical Psychologist, [facility] [address], [place], [place] [address] [phone]

## 2019-05-16 ENCOUNTER — Other Ambulatory Visit: Payer: Self-pay | Admitting: Internal Medicine

## 2019-05-16 DIAGNOSIS — R1031 Right lower quadrant pain: Secondary | ICD-10-CM | POA: Diagnosis not present

## 2019-05-16 DIAGNOSIS — R109 Unspecified abdominal pain: Secondary | ICD-10-CM | POA: Diagnosis not present

## 2019-05-16 DIAGNOSIS — R300 Dysuria: Secondary | ICD-10-CM | POA: Diagnosis not present

## 2019-05-16 DIAGNOSIS — R110 Nausea: Secondary | ICD-10-CM | POA: Diagnosis not present

## 2019-05-16 DIAGNOSIS — M3500 Sicca syndrome, unspecified: Secondary | ICD-10-CM | POA: Diagnosis not present

## 2019-05-16 DIAGNOSIS — I499 Cardiac arrhythmia, unspecified: Secondary | ICD-10-CM | POA: Diagnosis not present

## 2019-05-17 ENCOUNTER — Ambulatory Visit
Admission: RE | Admit: 2019-05-17 | Discharge: 2019-05-17 | Disposition: A | Discharge status: Home / Self Care | Payer: Medicare HMO | Source: Ambulatory Visit | Attending: Internal Medicine | Admitting: Internal Medicine

## 2019-05-17 ENCOUNTER — Ambulatory Visit: Admission: RE | Admit: 2019-05-17 | Payer: Medicare HMO | Source: Ambulatory Visit

## 2019-05-17 ENCOUNTER — Other Ambulatory Visit: Payer: Self-pay

## 2019-05-17 DIAGNOSIS — R1031 Right lower quadrant pain: Secondary | ICD-10-CM | POA: Diagnosis not present

## 2019-05-17 MED ORDER — IOHEXOL 300 MG/ML  SOLN
75.0000 mL | Freq: Once | INTRAMUSCULAR | Status: AC | PRN
  Administered 2019-05-17: 75 mL via INTRAVENOUS

## 2019-05-19 NOTE — Addendum Note (Signed)
Addended by: [name] A on: 05/19/2019 06:16 PM   Modules accepted: Level of Service

## 2019-05-20 ENCOUNTER — Ambulatory Visit: Payer: Medicare HMO

## 2019-05-24 ENCOUNTER — Ambulatory Visit (INDEPENDENT_AMBULATORY_CARE_PROVIDER_SITE_OTHER): Payer: Medicare HMO | Admitting: Family Medicine

## 2019-05-24 ENCOUNTER — Encounter: Payer: Self-pay | Admitting: Family Medicine

## 2019-05-24 ENCOUNTER — Other Ambulatory Visit: Payer: Self-pay

## 2019-05-24 DIAGNOSIS — B4481 Allergic bronchopulmonary aspergillosis: Secondary | ICD-10-CM | POA: Diagnosis not present

## 2019-05-24 DIAGNOSIS — I369 Nonrheumatic tricuspid valve disorder, unspecified: Secondary | ICD-10-CM | POA: Diagnosis not present

## 2019-05-24 DIAGNOSIS — K2100 Gastro-esophageal reflux disease with esophagitis, without bleeding: Secondary | ICD-10-CM

## 2019-05-24 DIAGNOSIS — R8271 Bacteriuria: Secondary | ICD-10-CM

## 2019-05-24 DIAGNOSIS — F411 Generalized anxiety disorder: Secondary | ICD-10-CM

## 2019-05-24 DIAGNOSIS — R69 Illness, unspecified: Secondary | ICD-10-CM | POA: Diagnosis not present

## 2019-05-24 DIAGNOSIS — Q228 Other congenital malformations of tricuspid valve: Secondary | ICD-10-CM

## 2019-05-24 NOTE — Progress Notes (Signed)
[name], female   DOB: [birth year], 63 y.o.   MRN: [id number]    Virtual Visit via video Note  This visit type was conducted due to national recommendations for restrictions regarding the COVID-19 pandemic (e.g. social distancing).  This format is felt to be most appropriate for this patient at this time.  All issues noted in this document were discussed and addressed.  No physical exam was performed (except for noted visual exam findings with Video Visits).   I connected with Sandra Burns today at  1:00 PM EDT by a video enabled telemedicine application and verified that I am speaking with the correct person using two identifiers. Location patient: home Location provider: work or home office Persons participating in the virtual visit: patient, provider  I discussed the limitations, risks, security and privacy concerns of performing an evaluation and management service by video and the availability of in person appointments. I also discussed with the patient that there may be a patient responsible charge related to this service. The patient expressed understanding and agreed to proceed.  HPI:  Patient and I connected via video for her to establish with PCP.  She has a very complicated past medical history and I was able to review her old charts from [facility] via epic.  She has reactive airway disease as well as ABPA and is followed closely by pulmonology.  She also has tricuspid valve prolapse and other cardiac anomalies, follow-up closely with cardiology.  She has a diagnosis of generalized anxiety disorder has been dealing with a lot UTI type symptoms and was told she possibly may have interstitial cystitis.  States her previous PCP had done a urinalysis and urine culture which grew bacterial colonies and she was advised she needs to repeat this culture to make sure it is only a colonization or contamination.  Patient states she does not want to go back to her  previous PCP due to their office being extremely busy and feeling as if she cannot always get in her doctor somewhere she needs to.  Review concern today is having urinary issues addressed.   ROS: See pertinent positives and negatives per HPI.  Past Medical History:  Diagnosis Date  . ABPA (allergic bronchopulmonary aspergillosis) ([place]) 03/19/2019  . Arrhythmia   . Arthritis   . Asthma   . Back pain   . Chronic obstructive airway disease ([place])   . Diverticulosis   . Fibromyalgia   . Fibromyalgia   . GERD (gastroesophageal reflux disease)   . Irritable bowel syndrome   . Kidney stone   . Lupus ([place])    "suspected"  . Migraine   . Mitral valve prolapse   . 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 Osteoporosis   . Paroxysmal A-fib ([place])   . Psoriatic arthritis ([place])   . Raynaud's disease   . Reactive airway disease   . S/P chemotherapy, time since greater than 12 weeks 1984   cervical ca    Past Surgical History:  Procedure Laterality Date  . ABDOMINAL HYSTERECTOMY    . BACK SURGERY    . BREAST BIOPSY Left 20+ YRS AGO   EXCISIONAL - NEG  . CHOLECYSTECTOMY    . OOPHORECTOMY      Family History  Problem Relation Age of Onset  . Breast cancer Neg Hx    Social History   Tobacco Use  . Smoking status: Former Smoker    Quit date: 07/13/1981    Years since quitting: 37.8  .  Smokeless tobacco: Never Used  Substance Use Topics  . Alcohol use: Not Currently    Comment: drank as a teen.  Not since    Current Outpatient Medications:  .  ALPRAZolam (XANAX) 0.5 MG tablet, TAKE 1 & 1/2 TABS IN AM, 1 & 1/2 TABS AT LUNCH, 1 TAB AT DINNER, 1 & 1/2 TABS AT BEDTIME AS NEEDED, Disp: 165 tablet, Rfl: 3 .  Artificial Tear Ointment (REFRESH P.M. OP), Place 1 application into both eyes at bedtime. Apply eye gel to both eyelids, Disp: , Rfl:  .  aspirin 81 MG chewable tablet, Chew 81 mg by mouth daily., Disp: , Rfl:  .  atenolol (TENORMIN) 25 MG tablet, Take 12.5 mg by mouth  2 (two) times daily. , Disp: , Rfl:  .  budesonide (PULMICORT) 180 MCG/ACT inhaler, Inhale 2 puffs into the lungs 2 (two) times daily. , Disp: , Rfl:  .  dicyclomine (BENTYL) 20 MG tablet, Take 1 tablet (20 mg total) by mouth 2 (two) times daily. (Patient taking differently: Take 20 mg by mouth daily as needed for spasms. ), Disp: 20 tablet, Rfl: 0 .  fluticasone (FLONASE) 50 MCG/ACT nasal spray, Place 1 spray into both nostrils daily as needed for allergies., Disp: , Rfl: 6 .  ipratropium (ATROVENT HFA) 17 MCG/ACT inhaler, Inhale 2 puffs into the lungs 4 (four) times daily., Disp: , Rfl:  .  Multiple Vitamin (MULTIVITAMIN WITH MINERALS) TABS, Take 1 tablet by mouth daily., Disp: , Rfl:  .  pantoprazole (PROTONIX) 40 MG tablet, Take 40 mg by mouth daily as needed (GERD). For stomach , Disp: , Rfl:  .  Polyvinyl Alcohol-Povidone (REFRESH OP), Place 1 drop into both eyes daily as needed (dry eyes)., Disp: , Rfl:   EXAM:  GENERAL: alert, oriented, appears well and in no acute distress  HEENT: atraumatic, conjunttiva clear, no obvious abnormalities on inspection of external nose and ears  NECK: normal movements of the head and neck  LUNGS: on inspection no signs of respiratory distress, breathing rate appears normal, no obvious gross SOB, gasping or wheezing  CV: no obvious cyanosis  MS: moves all visible extremities without noticeable abnormality  PSYCH/NEURO: pleasant and cooperative, no obvious depression or anxiety, speech and thought processing grossly intact  ASSESSMENT AND PLAN:  Discussed the following assessment and plan:  Long discussion with patient going over her medical history, currently patient, recent lab work specialist she sees and plan moving forward.  We will start with repeat urine culture to follow-up on bacterial growth on previous urine culture.  Patient does not want any other new lab work at this time due to having about 1 month ago at previous PCP.  We will also  plan for patient to schedule complete physical exam in the next few months, she will keep all specialist appointments as scheduled.  1. Bacteria in urine  - Urine Culture; Future  2. ABPA (allergic bronchopulmonary aspergillosis) ([place])   3. GAD (generalized anxiety disorder)   4. Tricuspid valve prolapse   5. Gastroesophageal reflux disease with esophagitis   I discussed the assessment and treatment plan with the patient. The patient was provided an opportunity to ask questions and all were answered. The patient agreed with the plan and demonstrated an understanding of the instructions.   The patient was advised to call back or seek an in-person evaluation if the symptoms worsen or if the condition fails to improve as anticipated.  I provided 45 minutes of video-face-to-face time during this  encounter.   Jodelle Green, FNP

## 2019-05-27 ENCOUNTER — Other Ambulatory Visit: Payer: Medicare HMO

## 2019-05-28 ENCOUNTER — Ambulatory Visit (INDEPENDENT_AMBULATORY_CARE_PROVIDER_SITE_OTHER): Payer: Medicare HMO | Admitting: Psychiatry

## 2019-05-28 ENCOUNTER — Other Ambulatory Visit: Payer: Self-pay

## 2019-05-28 DIAGNOSIS — Z62820 Parent-biological child conflict: Secondary | ICD-10-CM

## 2019-05-28 DIAGNOSIS — F331 Major depressive disorder, recurrent, moderate: Secondary | ICD-10-CM | POA: Diagnosis not present

## 2019-05-28 DIAGNOSIS — A310 Pulmonary mycobacterial infection: Secondary | ICD-10-CM

## 2019-05-28 DIAGNOSIS — R69 Illness, unspecified: Secondary | ICD-10-CM | POA: Diagnosis not present

## 2019-05-28 DIAGNOSIS — Z636 Dependent relative needing care at home: Secondary | ICD-10-CM

## 2019-05-28 DIAGNOSIS — F411 Generalized anxiety disorder: Secondary | ICD-10-CM | POA: Diagnosis not present

## 2019-05-28 NOTE — Progress Notes (Signed)
Psychotherapy Progress Note [facility] [name], PhD LP  Patient ID: Devera Englander     MRN: [id number]     Therapy format: Individual psychotherapy Date: 05/28/2019     Start: 3:14p Stop: 4:19p Time Spent: 65 min Location: telehealth   Telehealth visit -- I connected with this patient by an approved telecommunication method (video), with her informed consent, and verifying identity and patient privacy.  I was located at my office and patient at her home.  As needed, we discussed the limitations, risks, and security and privacy concerns associated with telehealth service, including the availability and conditions which currently govern in-person appointments and the possibility that 3rd-party payment may not be fully guaranteed and she may be responsible for charges.  After she indicated understanding, we proceeded with the session.  Also discussed treatment planning, as needed, including ongoing verbal agreement with the plan, the opportunity to ask and answer all questions, her demonstrated understanding of instructions, and her readiness to call the office should symptoms worsen or she feels she is in a crisis state and needs more immediate and tangible assistance.  Session narrative (presenting needs, interim history, self-report of stressors and symptoms, applications of prior therapy, status changes, and interventions made in session) Using (light) blue blockers for eyestrain, corneal issues, and soothing.  [name] had another pancreatitis attack.  Pt slept 3 hrs on couch last night, awoke, DFA in bed, up this morning for GI f/u for him.  More satisfied with his PCP's care of him at this point, but still inclined to change practices due to critical errors in communication which enabled him to get in such bad shape.  A lot of thoughts about son, too, estranged from him and prevented from grandchildren nearly a year now.  Nighttime is regret time, assaulted sometimes  by second-guessing whether she should be in more touch herself, though it's been 20 years of fractious relations and breaking her heart.  Confesses she dislikes the person her son has become.  Family history of grudges, e.g., brother and father not speaking for a year and a half.  Last night had a petulant moment in text with son.  Had text exchange with daughter-in-law that went incomplete, texted [name], no answer , asked if he gets them, quick reply yes.  Hx of tightly regulated visiting, in ways that seemed to be marginalizing PT, following being there three months to help [name] while pregnant and ill.  Hx of [name] being disrespectful, eventually making some outlandish psychoanalyzed accusation about her soliciting [name] attention, strongly suspected to be [name] analysis, from a longer time of not getting along.  Last breaking point came in talking with Nira Conn about the strictures on visiting, Nira Conn taking offense, pattern of both of them being strangely obsessive, indirect, and prone to interpret each other.  Hx of attributing and projecting a lot onto the child, e.g., how devastating it will be for granddaughter not to know [name] b/c he never came to visit with PT (?).  A lot of double-talk about concerns with the child.  Long, consistent experience having [name] jump to conclusions about PT's intentions and motives, not stopping to listen or ask questions, and h/o admitting he has no empathy or tolerance for PT's or anyone else's feelings.  Hx of ODD dx in middle school, on top of a combination of learning disabilities.  Knows it's essentially her mother's voice at night, urging her to make peace at all costs, and it is that voice  she is rebelling against in becoming sarcastic with [name].  Normalized feeling burnt by him and indulging the urge to be cutting with him, encouraging her to settle again with the apparent fact that he is also neuroatypical, and she may never be able to hope  for normal empathy from him, let alone a warm response to being scolded.  Had a clarifying conversation with friend [name] (fellow PT) getting her to break silence and working out her friend's overvalued and wrongheaded attempt to care for Pt by keeping her own troubles out of PT's ear.  Secured a good agreement to disclose and share and lean on her anyway, partly making it clear it is a good distraction and sense of usefulness to listen to [name]'s issues.  Reinforced saying so as a further good exercise in vulnerability, trust, and assertiveness, experience which is hopefully liberating to both of them.  Confesses last night she was stunned to feel how skinny her husband has become.  He may well be headed for intravenous feeding for life, depending.  He is set up for parallel therapy with [facility], beginning Friday.  They have had frank, supportive talk together, empathizing and letting him in on things that happened while he was cognitively compromised with severe pancreatitis and necrosis.  Discussed boundaries and expectations for parallel therapies, including limits of individual privacy and [place] with both individuals and their relationship, reserving the right to  urge each to bring things up with each other, guide each in communicating better if needed, and, if necessary for individual safety or respect of their marriage, reserve the right to break confidentiality with the other in [place]'s own judgment.  Agrees.  Therapeutic modalities: Solution-Oriented/Positive Psychology  Mental Status/Observations:  Appearance:   Casual     Behavior:  Appropriate  Motor:  Normal  Speech/Language:   Clear and Coherent  Affect:  Appropriate  Mood:  mildly irritable, wearied  Thought process:  normal  Thought content:    WNL  Sensory/Perceptual disturbances:    WNL  Orientation:  grossly intact  Attention:  Good  Concentration:  Good  Memory:  WNL  Insight:    Good  Judgment:   Good  Impulse Control:   Good   Risk Assessment: Danger to Self: No Self-injurious Behavior: No Danger to Others: No Physical Aggression / Violence: No Duty to Warn: No Access to Firearms a concern: No  Assessment of progress:  progressing  Diagnosis:   ICD-10-CM   1. Moderate episode of recurrent major depressive disorder (HCC)  F33.1   2. Generalized anxiety disorder  F41.1   3. Caregiver stress  Z63.6   4. Relationship problem between parent and child  Z62.820   5. Mycobacterium avium complex ([place])  A31.0    Plan:  . Continue working out caregiving routines  . Other recommendations/advice as noted above . Continue to utilize previously learned skills ad lib . Maintain medication as prescribed and work faithfully with relevant prescriber(s) if any changes are desired or seem indicated . Call the clinic on-call service, present to ER, or call 911 if any life-threatening psychiatric crisis No follow-ups on file.  [name], PhD [name], PhD LP Clinical Psychologist, [facility] [address], [place],  [address] [phone]

## 2019-05-31 ENCOUNTER — Other Ambulatory Visit: Payer: Medicare HMO

## 2019-05-31 ENCOUNTER — Ambulatory Visit: Payer: Medicare HMO | Admitting: Gastroenterology

## 2019-06-04 DIAGNOSIS — J301 Allergic rhinitis due to pollen: Secondary | ICD-10-CM | POA: Diagnosis not present

## 2019-06-04 DIAGNOSIS — J069 Acute upper respiratory infection, unspecified: Secondary | ICD-10-CM | POA: Diagnosis not present

## 2019-06-25 DIAGNOSIS — R69 Illness, unspecified: Secondary | ICD-10-CM | POA: Diagnosis not present

## 2019-07-03 ENCOUNTER — Other Ambulatory Visit: Payer: Self-pay

## 2019-07-03 ENCOUNTER — Ambulatory Visit (INDEPENDENT_AMBULATORY_CARE_PROVIDER_SITE_OTHER): Payer: Medicare HMO | Admitting: Psychiatry

## 2019-07-03 DIAGNOSIS — F331 Major depressive disorder, recurrent, moderate: Secondary | ICD-10-CM | POA: Diagnosis not present

## 2019-07-03 DIAGNOSIS — Z636 Dependent relative needing care at home: Secondary | ICD-10-CM | POA: Diagnosis not present

## 2019-07-03 DIAGNOSIS — Z62820 Parent-biological child conflict: Secondary | ICD-10-CM

## 2019-07-03 DIAGNOSIS — A310 Pulmonary mycobacterial infection: Secondary | ICD-10-CM

## 2019-07-03 DIAGNOSIS — F411 Generalized anxiety disorder: Secondary | ICD-10-CM

## 2019-07-03 DIAGNOSIS — R69 Illness, unspecified: Secondary | ICD-10-CM | POA: Diagnosis not present

## 2019-07-03 NOTE — Progress Notes (Signed)
Psychotherapy Progress Note [facility] [name], PhD LP  [name]     MRN: [id number]     Therapy format: Individual psychotherapy Date: 07/03/2019     Start: 9:10 Stop: 10:00a Time Spent: 50 min Location: telehealth   Telehealth visit -- I connected with this patient by an approved telecommunication method (video), with her informed consent, and verifying identity and patient privacy.  I was located at my office and patient at her hotel.  As needed, we discussed the limitations, risks, and security and privacy concerns associated with telehealth service, including the availability and conditions which currently govern in-person appointments and the possibility that 3rd-party payment may not be fully guaranteed and she may be responsible for charges.  After she indicated understanding, we proceeded with the session.  Also discussed treatment planning, as needed, including ongoing verbal agreement with the plan, the opportunity to ask and answer all questions, her demonstrated understanding of instructions, and her readiness to call the office should symptoms worsen or she feels she is in a crisis state and needs more immediate and tangible assistance.  Session narrative (presenting needs, interim history, self-report of stressors and symptoms, applications of prior therapy, status changes, and interventions made in session) H in hospital at 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  Short night last night with texts from friends and [name].  Managing her own infection control procedures in the process.  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 Optimistic surgery prospect to either clear or reconstruct his pancreatic duct.  Has a Surveyor, minerals, and has realistic, reassuring word that he has enough of his pancreas left to work with.  H has good attitude and not in  altered mental status this time.  NG tube placed easily. Down to the jejunum to bypass pancreas.    Tomorrow is wedding anniversary, and PT decided it's better to lodge in [place], see more of him.  Sees dennis taking a positive attitude, enjoying [employer] game, trust the surgeon, make positive notes on his calendar while in hospital.  PT met an inspirational woman from [place] whose husband is over 67 days in for [place].    Has eaten pretty well while there, normally appetite suppressed under stress, and very mindful not to eat in front of H.  She dropped under 100 lbs at one point, made herself eat and drink Boost to offset.   Had another "ordeal" with son Shea Evans and DIL Nira Conn, asking if she could do a video call.  Son did not reply, DIL replied in an oddly formal way, on behalf of [place], stating how it "scared [name]" when they called in lockdown, and explained how they had seen a couple with COVID and did fine, and stating she wasn't worried if PT visited in person, sure they wouldn't catch it.  PT's analysis that [name] is being passive-aggressive offering the impossible invitation, sent back a small-picture text to Nira Conn with regret that they could not work it out, virus risk still prevents, won't try to plan anything "until there's a vaccine".  Feels she successfully left it in their corner without playing into the negative game.  Since then has been working on the letter not to send, declaring she's done with any relationship with her son.  Working drafts of it, shortening up to explain less.  May very well not send anything, just let it  peter out.  Discussed interpretations of son's psychology, really understands him to be neurologically defective, possibly sociopathic, definitely stuck in defensiveness.  Just been hard to reckon with her mother's voice telling her to give her utmost to trying to foster a relationship.  Endorsed allowing it to go quiet, entertained the idea of what her  mother would think/say if she knew more.  Friend [name] undertook a major trust risk with PT, telling her about her failing, alcoholic marriage, and PT helped her break through stringent self-shaming to do so.  Empathically painful.  Confirmed that PT helped friend overcome shame, commended.  Also that friend has been back in touch quickly, not gone away.  Has struggled with hard feelings momentarily when [name] can't be available for PT, but understanding that she has also been overwhelmed with other concerns (daughter nearly died in childbirth recently).     Therapeutic modalities: Cognitive Behavioral Therapy and Ego-Supportive  Mental Status/Observations:  Appearance:   Casual     Behavior:  Appropriate  Motor:  Normal  Speech/Language:   Clear and Coherent  Affect:  Appropriate  Mood:  weary but even  Thought process:  normal  Thought content:    WNL  Sensory/Perceptual disturbances:    WNL  Orientation:  grossly intact  Attention:  Good  Concentration:  Good  Memory:  WNL  Insight:    Good  Judgment:   Good  Impulse Control:  Good   Risk Assessment: Danger to Self: No Self-injurious Behavior: No Danger to Others: No Physical Aggression / Violence: No Duty to Warn: No Access to Firearms a concern: No  Assessment of progress:  progressing  Diagnosis:   ICD-10-CM   1. Moderate episode of recurrent major depressive disorder (HCC)  F33.1   2. Generalized anxiety disorder  F41.1   3. Caregiver stress  Z63.6   4. Relationship problem between parent and child  Z62.820   5. Mycobacterium avium complex ([place])  A31.0     Plan:  . Write to process as needed unresolved feelings with son, concur in not sending letter . Continue self-coaching for momentary resentments with son, DIL, friend, others . Maintain adequate nutrition . Other recommendations/advice as noted above . Continue to utilize previously learned skills ad lib . Maintain medication as prescribed and work  faithfully with relevant prescriber(s) if any changes are desired or seem indicated . Call the clinic on-call service, present to ER, or call 911 if any life-threatening psychiatric crisis Return in about 3 weeks (around 07/24/2019).  [name], PhD [name], PhD LP Clinical Psychologist, [facility] [address] [place], [place] [address] [phone]

## 2019-07-05 ENCOUNTER — Other Ambulatory Visit: Payer: Self-pay

## 2019-07-05 ENCOUNTER — Encounter: Payer: Self-pay | Admitting: Physician Assistant

## 2019-07-05 ENCOUNTER — Ambulatory Visit (INDEPENDENT_AMBULATORY_CARE_PROVIDER_SITE_OTHER): Payer: Medicare HMO | Admitting: Physician Assistant

## 2019-07-05 DIAGNOSIS — R69 Illness, unspecified: Secondary | ICD-10-CM | POA: Diagnosis not present

## 2019-07-05 DIAGNOSIS — F411 Generalized anxiety disorder: Secondary | ICD-10-CM

## 2019-07-05 MED ORDER — ALPRAZOLAM 0.5 MG PO TABS: ORAL_TABLET | ORAL | 3 refills | Status: DC

## 2019-07-05 NOTE — Progress Notes (Signed)
[facility]  [name],  MRN: [id number]  PCP: Tracey Harries, FNP  Date of Evaluation: 07/05/2019 Time spent:15 minutes  Chief Complaint:  Chief Complaint    Anxiety     Virtual Visit via Telephone Note  I connected with patient by a video enabled telemedicine application or telephone, with their informed consent, and verified patient privacy and that I am speaking with the correct person using two identifiers.  I am private, in my office and the patient is in a hotel room across from [facility].  I discussed the limitations, risks, security and privacy concerns of performing an evaluation and management service by telephone and the availability of in person appointments. I also discussed with the patient that there may be a patient responsible charge related to this service. The patient expressed understanding and agreed to proceed.   I discussed the assessment and treatment plan with the patient. The patient was provided an opportunity to ask questions and all were answered. The patient agreed with the plan and demonstrated an understanding of the instructions.   The patient was advised to call back or seek an in-person evaluation if the symptoms worsen or if the condition fails to improve as anticipated.  I provided 15 minutes of non-face-to-face time during this encounter.  HISTORY/CURRENT STATUS: HPI For routine med check.  Has still been very stressed. Her husband isn't doing well after a bout w/ severe pancreatitis in July. He had another procedure on Monday at [facility] and they found he still has pancreatitis.  He was admitted and has been there since.  Has [place] tube and will be npo for a month when he goes home.  They're doing everything they can to prevent a Whipple procedure.  "It's been a very stressful time.  I think the Xanax is working.  I AM NOT going to increase the 1/2 of Xanax back up either.  I'm not going to lose the ground I've  gained.  But I'm not sure how I'd be if I didn't have it." Not sleeping really well right now b/c so much stress and she's in a hotel in [place].  But had been until now.   "I'm not depressed.  I'm just sad.  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  She does not have a lot of energy but is not sure if that is due to the physical or the mental problems.  She cries sometimes but does not last long.  Denies suicidal or homicidal thoughts.  Patient denies increased energy with decreased need for sleep, no increased talkativeness, no racing thoughts, no impulsivity or risky behaviors, no increased spending, no increased libido, no grandiosity.  Denies dizziness, syncope, seizures, numbness, tingling, tremor, tics, unsteady gait, slurred speech, confusion. Denies muscle or joint pain, stiffness, or dystonia.  Individual Medical History/ Review of Systems: Changes? :No    Past medications for mental health diagnoses include: Zoloft, caused psychosis w/ hallucinations,  BuSpar, Ativan, Effexor (became psychotic) She was told to never take any kind of SSRI/SNRI again.  Allergies: Albuterol, Amoxicillin, Cephalosporins, Ciprofloxacin, Flagyl [metronidazole], Macrobid [nitrofurantoin macrocrystal], Sulfa antibiotics, Vancomycin, Vicodin [hydrocodone-acetaminophen], Zithromax [azithromycin], Bacitracin-neomycin-polymyxin, Tobramycin, Decongest-aid [pseudoephedrine], Levofloxacin, and Prednisone  Current Medications:  Current Outpatient Medications:  .  ALPRAZolam (XANAX) 0.5 MG tablet, TAKE 1 & 1/2 TABS IN AM, 1 & 1/2 TABS AT LUNCH,  1 TAB AT DINNER, 1 & 1/2 TABS AT BEDTIME AS NEEDED, Disp: 165 tablet, Rfl: 3 .  Artificial Tear Ointment (REFRESH P.M. OP), Place 1 application into both eyes at bedtime. Apply eye gel to both eyelids, Disp: , Rfl:  .  aspirin 81 MG chewable tablet, Chew 81 mg  by mouth daily., Disp: , Rfl:  .  atenolol (TENORMIN) 25 MG tablet, Take 12.5 mg by mouth 2 (two) times daily. , Disp: , Rfl:  .  budesonide (PULMICORT) 180 MCG/ACT inhaler, Inhale 2 puffs into the lungs 2 (two) times daily. , Disp: , Rfl:  .  dicyclomine (BENTYL) 20 MG tablet, Take 1 tablet (20 mg total) by mouth 2 (two) times daily. (Patient taking differently: Take 20 mg by mouth daily as needed for spasms. ), Disp: 20 tablet, Rfl: 0 .  fluticasone (FLONASE) 50 MCG/ACT nasal spray, Place 1 spray into both nostrils daily as needed for allergies., Disp: , Rfl: 6 .  ipratropium (ATROVENT HFA) 17 MCG/ACT inhaler, Inhale 2 puffs into the lungs 4 (four) times daily., Disp: , Rfl:  .  Multiple Vitamin (MULTIVITAMIN WITH MINERALS) TABS, Take 1 tablet by mouth daily., Disp: , Rfl:  .  pantoprazole (PROTONIX) 40 MG tablet, Take 40 mg by mouth daily as needed (GERD). For stomach , Disp: , Rfl:  .  Polyvinyl Alcohol-Povidone (REFRESH OP), Place 1 drop into both eyes daily as needed (dry eyes)., Disp: , Rfl:  Medication Side Effects: none  Family Medical/ Social History: Changes? No  MENTAL HEALTH EXAM:  There were no vitals taken for this visit.There is no height or weight on file to calculate BMI.  General Appearance: unable to assess  Eye Contact:  unable to assess  Speech:  Clear and Coherent  Volume:  Normal  Mood:  Euthymic  Affect:  unable to assess  Thought Process:  Goal Directed and Descriptions of Associations: Intact  Orientation:  Full (Time, Place, and Person)  Thought Content: Logical   Suicidal Thoughts:  No  Homicidal Thoughts:  No  Memory:  WNL  Judgement:  Good  Insight:  Good  Psychomotor Activity:  unable to assess  Concentration:  Concentration: Good  Recall:  Good  Fund of Knowledge: Good  Language: Good  Assets:  Desire for Improvement  ADL's:  Intact  Cognition: WNL  Prognosis:  Good    DIAGNOSES:    ICD-10-CM   1. Generalized anxiety disorder  F41.1      Receiving Psychotherapy: Yes w/ Dr. Jonni Sanger Mitchum   RECOMMENDATIONS:  We discussed again the fact that she is unable to take any antidepressants or BuSpar that may help prevent the anxiety.  It would be ideal if she could. Continue Xanax 0.5 mg, 1.5 pills every morning, 1.5 pill at lunch, 1 pill at dinner, 1.5 at bedtime as needed. Continue psychotherapy. Return in 4 months. If we need to, we can go out a little further, depending on how she and husband are in Feb, and how flu season and COVID are. I will refill thru 4/21.  Donnal Moat, PA-C

## 2019-07-23 ENCOUNTER — Ambulatory Visit (INDEPENDENT_AMBULATORY_CARE_PROVIDER_SITE_OTHER): Payer: Medicare HMO | Admitting: Psychiatry

## 2019-07-23 ENCOUNTER — Other Ambulatory Visit: Payer: Self-pay

## 2019-07-23 DIAGNOSIS — F331 Major depressive disorder, recurrent, moderate: Secondary | ICD-10-CM | POA: Diagnosis not present

## 2019-07-23 DIAGNOSIS — Z8659 Personal history of other mental and behavioral disorders: Secondary | ICD-10-CM | POA: Diagnosis not present

## 2019-07-23 DIAGNOSIS — Z62820 Parent-biological child conflict: Secondary | ICD-10-CM

## 2019-07-23 DIAGNOSIS — A310 Pulmonary mycobacterial infection: Secondary | ICD-10-CM

## 2019-07-23 DIAGNOSIS — Z636 Dependent relative needing care at home: Secondary | ICD-10-CM

## 2019-07-23 DIAGNOSIS — R69 Illness, unspecified: Secondary | ICD-10-CM | POA: Diagnosis not present

## 2019-07-23 NOTE — Progress Notes (Signed)
Psychotherapy Progress Note [facility] [name], PhD LP  [name]     MRN: [id number]     Therapy format: Individual psychotherapy Date: 07/23/2019     Start: 9:21a Stop: 10:11a Time Spent: 50 min Location: telehealth   Telehealth visit -- I connected with this patient by an approved telecommunication method (video), with her informed consent, and verifying identity and patient privacy.  I was located at my office and patient at her home.  As needed, we discussed the limitations, risks, and security and privacy concerns associated with telehealth service, including the availability and conditions which currently govern in-person appointments and the possibility that 3rd-party payment may not be fully guaranteed and she may be responsible for charges.  After she indicated understanding, we proceeded with the session.  Also discussed treatment planning, as needed, including ongoing verbal agreement with the plan, the opportunity to ask and answer all questions, her demonstrated understanding of instructions, and her readiness to call the office should symptoms worsen or she feels she is in a crisis state and needs more immediate and tangible assistance.  Session narrative (presenting needs, interim history, self-report of stressors and symptoms, applications of prior therapy, status changes, and interventions made in session) Has come to peace with decision to let quiet fall with son [name].  Still will run through cycles of asking herself if she's doing the right thing, comes back around to clear indications he does not truly want her in his life.    One tactless friend Butch Penny who says (unintentionally?) diminishing things about her problems, but friend [name] and her daughter Ander Purpura have come a lot closer with Lauren's childbirth and near-death experience, providing PT a welcome opportunity to be helpful, as a knowledgeable nurse, and a veteran mother,  and a prosthetic "aunt" Minnette and grandma.  Feels very positive to break through to more of a familylike relationship and openness.    [name]'s health improving.  Home now, doing a lot of his own self-care and data keeping for his diabetes and pancreatic disease.  Went through dehydration episode, complicated by being on feeding tube.  Requires some insulin shots from PT.  Meaningful to set up his feeding pump for him ("making his dinner"), reassuring to know [name] can work it for himself.  Feeling some dread coming up to Nov. 3, when they will go back to [facility] for next word, but figures to pack for the possibility of having to admit again, and concerns for his diabetes and possible prednisone, and concerns for surgery to come.  While COVID 2nd wave ramps up.  Some reassurance she will be able to go in with him, since he has hearing deficit.    His condition means a shortened life, and indications of him worrying over her, trying not to be a bother, but also scared, wishes she was closer.  She is clear that neither she nor he may live a long life with their respective illnesses, had a recent talk with him to encourage him how he's doing coping and to make sure he knows he does not have to "be strong" for her, it's OK to let her know how he feels and what he needs.  Led to a somewhat more revealing, intimate moment, including acknowledging how she has strengthened in the last 2 years vs. profound weakness, enough to be relied upon for him.  Still knows her own health condition means shortened life, and extreme susceptibility to COVID if it  happens to her.  More concerned about COVID pandemic on the rise, people being unwise about infection control, and unrealistic, wrongheaded rhetoric from leaders.  Has stockpiled for the winter, when flu season already mandates staying home as much as possible.    Lost 10 lbs during [name]'s illness, beginning to gain back after getting down to 98.5 lbs @ _0 .  Is leaving  snack foods out so she will graze on sight, rather than rely on her suppressed appetite.  Has gained back 3 lbs as a result.    Therapeutic modalities: Solution-Oriented/Positive Psychology, Narrative and Ego-Supportive  Mental Status/Observations:  Appearance:   Casual and Neat     Behavior:  Appropriate  Motor:  Normal  Speech/Language:   Clear and Coherent  Affect:  Appropriate  Mood:  more serene but stated anxiety  Thought process:  normal  Thought content:    WNL  Sensory/Perceptual disturbances:    WNL  Orientation:  grossly intact  Attention:  Good  Concentration:  Good  Memory:  WNL  Insight:    Good  Judgment:   Good  Impulse Control:  Good   Risk Assessment: Danger to Self: No Self-injurious Behavior: No Danger to Others: No Physical Aggression / Violence: No Duty to Warn: No Access to Firearms a concern: No  Assessment of progress:  progressing well  Diagnosis:   ICD-10-CM   1. Moderate episode of recurrent major depressive disorder (HCC)  F33.1   2. Caregiver stress  Z63.6   3. Relationship problem between parent and child  Z62.820   4. 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 History of posttraumatic stress disorder (PTSD)  Z86.59     Plan:  . Continue to self-validate letting son be . Continue positive efforts to care for husband and self, acknowledging and doing the next thing . Continue to affirm and accept the value of life and marriage, however temporary each may turn out to be, and just-for-today philosophy . Other recommendations/advice as noted above . Continue to utilize previously learned skills ad lib . Maintain medication as prescribed and work faithfully with relevant prescriber(s) if any changes are desired or seem indicated . Call the clinic on-call service, present to ER, or call 911 if any life-threatening psychiatric crisis Return for time as available.  [name], PhD [name], PhD LP Clinical Psychologist, [facility] [address] [place], [place] [address] [phone]

## 2019-08-07 DIAGNOSIS — J45909 Unspecified asthma, uncomplicated: Secondary | ICD-10-CM | POA: Diagnosis not present

## 2019-08-07 DIAGNOSIS — J309 Allergic rhinitis, unspecified: Secondary | ICD-10-CM | POA: Diagnosis not present

## 2019-08-07 DIAGNOSIS — J479 Bronchiectasis, uncomplicated: Secondary | ICD-10-CM | POA: Diagnosis not present

## 2019-08-16 ENCOUNTER — Ambulatory Visit (INDEPENDENT_AMBULATORY_CARE_PROVIDER_SITE_OTHER): Payer: Medicare HMO | Admitting: Psychiatry

## 2019-08-16 ENCOUNTER — Other Ambulatory Visit: Payer: Self-pay

## 2019-08-16 DIAGNOSIS — G4700 Insomnia, unspecified: Secondary | ICD-10-CM

## 2019-08-16 DIAGNOSIS — A310 Pulmonary mycobacterial infection: Secondary | ICD-10-CM

## 2019-08-16 DIAGNOSIS — Z8659 Personal history of other mental and behavioral disorders: Secondary | ICD-10-CM | POA: Diagnosis not present

## 2019-08-16 DIAGNOSIS — Z636 Dependent relative needing care at home: Secondary | ICD-10-CM | POA: Diagnosis not present

## 2019-08-16 DIAGNOSIS — F331 Major depressive disorder, recurrent, moderate: Secondary | ICD-10-CM

## 2019-08-16 DIAGNOSIS — R69 Illness, unspecified: Secondary | ICD-10-CM | POA: Diagnosis not present

## 2019-08-16 NOTE — Progress Notes (Signed)
Psychotherapy Progress Note [facility] 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  Concern for how many anesthesias he can handle.  In c/o highly specialized care at [facility], good possibility of reconstructive surgery(-ies) for his pancreas.  Some difficulty coping with incomplete and changing reads on his condition and strategy, and Sandra Burns still leaking pancreas, still on feeding tube.  COVID infection rate also is putting pressure on timing and her access to be there in support.  Sandra Burns is suffering cognitively and knows it.  Had an unfortunate argument with Sandra Burns the other day, feels bad about it since he is looking more terminal.  Other bad luck with car repair issue, and th dealership getting shut down for COVID exposure.  Issue with Sandra Burns dropping "negatives" on her, needs him to manage the flow of problems some.  Her insomnia and fatigue at issue, and she ends up being his representative more and more, and the only one who can keep his care together.  Wearying as his emotional support, and having to see her husband dying in pieces, basically, with cognitive losses, hair loss, erosion of his ability to think of her needs.  He has also balked a couple places when she sounded to be needy of his validation and caring and he indulged an old impulse to tell her if she doesn't like it.  Validated, encouraged, framed the likelihood that he is feeling weaker in spirit and ability to focus and remember, and any agnry words either way are much more a matter of trying not to feel helpless at the  moment.  Knows she can be a little bit "[employer]" under duress.  Meanwhile, her appetite continues to be off, and she may need reminding to eat, and a lot of her friends tend to give platitudes, or pitch what she "should" do.  Despite insensitive phrasing, she has taken a good suggestion to set up a [facility] account, to centralize communication with people who care about her.    With medical care, encouraged to keep trying to put forth her wishes -- clarity, communication, the unvarnished truth what to expect clearly enough to prevent resenting things or feeling tempted to threaten.  Therapeutic modalities: Solution-Oriented/Positive Psychology, Narrative, Ego-Supportive and faith-sensitive  Mental Status/Observations:  Appearance:   Casual     Behavior:  Appropriate  Motor:  Normal  Speech/Language:   Clear and Coherent  Affect:  Appropriate  Mood:  weary, somewhat irritable  Thought process:  normal  Thought content:    WNL  Sensory/Perceptual disturbances:    WNL  Orientation:  Fully oriented  Attention:  Good  Concentration:  Good  Memory:  WNL  Insight:    Good  Judgment:   Good  Impulse Control:  Good   Risk Assessment: Danger to Self: No Self-injurious Behavior: No Danger to Others: No Physical Aggression / Violence: No Duty to Warn: No Access to Firearms a concern: No  Assessment of progress:  stabilized  Diagnosis:   ICD-10-CM   1. Moderate episode of recurrent major depressive disorder (HCC)  F33.1   2. Caregiver stress  Z63.6   3. 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 History of posttraumatic stress disorder (PTSD)  Z86.59   5. Insomnia, unspecified type  G47.00     Plan:  . Communication tips with H and medical care . Permission to emote . Empathy education links sent by email, with consent . Other recommendations/advice as noted above . Continue to utilize previously learned skills ad lib . Maintain medication as prescribed and work  faithfully with relevant prescriber(s) if any changes are desired or seem indicated . Call the clinic on-call service, present to ER, or call 911 if any life-threatening psychiatric crisis Return for time as available, will call. Current [facility] appointments: Future Appointments  Date Time Provider [place]  08/19/2019  1:00 PM [name], PhD CP-CP None  09/04/2019 10:30 AM Tommy Medal, Lavell Islam, MD [facility]  11/05/2019 10:00 AM Addison Lank, PA-C CP-CP None    [name], PhD [name], PhD LP Clinical Psychologist, [facility] [address], [place], [place] [address] [phone]

## 2019-08-19 ENCOUNTER — Ambulatory Visit (INDEPENDENT_AMBULATORY_CARE_PROVIDER_SITE_OTHER): Payer: Medicare HMO | Admitting: Psychiatry

## 2019-08-19 ENCOUNTER — Other Ambulatory Visit: Payer: Self-pay

## 2019-08-19 DIAGNOSIS — Z636 Dependent relative needing care at home: Secondary | ICD-10-CM | POA: Diagnosis not present

## 2019-08-19 DIAGNOSIS — G4700 Insomnia, unspecified: Secondary | ICD-10-CM

## 2019-08-19 DIAGNOSIS — I499 Cardiac arrhythmia, unspecified: Secondary | ICD-10-CM | POA: Diagnosis not present

## 2019-08-19 DIAGNOSIS — Z8659 Personal history of other mental and behavioral disorders: Secondary | ICD-10-CM | POA: Diagnosis not present

## 2019-08-19 DIAGNOSIS — R69 Illness, unspecified: Secondary | ICD-10-CM | POA: Diagnosis not present

## 2019-08-19 DIAGNOSIS — F331 Major depressive disorder, recurrent, moderate: Secondary | ICD-10-CM | POA: Diagnosis not present

## 2019-08-19 DIAGNOSIS — A310 Pulmonary mycobacterial infection: Secondary | ICD-10-CM | POA: Diagnosis not present

## 2019-08-19 NOTE — Progress Notes (Signed)
Psychotherapy Progress Note [facility] [name], PhD LP    Patient ID: Kasidi Shanker     MRN: [id number]     Therapy format: Individual psychotherapy Date: 08/19/2019     Start: 1:05p Stop: 2:05p Time Spent: 45 min (remainder donated) Location: telehealth   Session narrative (presenting needs, interim history, self-report of stressors and symptoms, applications of prior therapy, status changes, and interventions made in session) [name]'s birthday this weekend, sees him depressed.  Tube feeding since 10/7.  Some touchy time asking him about it and him reacting.  His brother had clumsy things to say, and PT continues to chafe at people who say things that subtly invalidate.  Did post the [employer] video on her FB wall as word to all concerned how empathy works.  Offered her time to [place] today, declined on grounds that there is little [place] can do to change his condition, and he readily acknowledged how he feels to her.  Both have clear advance directives, both want for clearest possible word whether they are working on a realistic hope of improvement or in it for quality of life and in need of palliative care.  Hopeful of surgeon getting more time to review scans, very much wants to have the conversation with him to be sure he knows they can take, and do want, full clarity.  Discussed priority messages, encouraged to be succinct as possible and prioritize the fact that she and the patient both want frankness, even if he is not that forward about it.  Pretty sure he is in renewed pancreatitis lately, too, with leaking and pooling, pain built up.  On examination, does feel ready to engage the surgeon without anger.  Meanwhile, COVID rate going up fast in her area, meaning rising concern about contracting it and/or passing it to [place], with both of them severely high risk.  Awaiting car to get out of quarantine, figures to sanitize it heavily anyway.  Marthenia Rolling, believes she  may not survive the pandemic, as careless as people around her are being and as vulnerable as she is medically.    Decided against unfriending the friend Jenny Reichmann who said she was glad to see how PT had "grown", just knocked her off the feed for her writing and poetry, some of which can be raw, and Cindy's religious proclivities tend to make her sound aloof and lead her to tend others' spirituality uninvited.    Ultimately, knows H has ongoing inflammation, collateral liver damage, is losing weight despite over 2000 cal, and feels she can accept it if her husband dies of his illness.  Sees him letting go, actually.  Glad she has had the time to love more eyes-open these last several months.  Resolved by the end that she has developed, and can employ, a mindset for recovery and a mindset for letting him go, whichever time it turns out to be.  Has gotten out old pictures and video, shared them with H.  Support/empathy provided.   Therapeutic modalities: Humanistic/Existential, Ego-Supportive and faith-sensitive  Mental Status/Observations:  Appearance:   Casual     Behavior:  Appropriate  Motor:  Normal  Speech/Language:   Clear and Coherent  Affect:  Appropriate and Restricted  Mood:  dysthymic  Thought process:  normal  Thought content:    WNL  Sensory/Perceptual disturbances:    WNL  Orientation:  Fully oriented  Attention:  Good  Concentration:  Good  Memory:  WNL  Insight:  Good  Judgment:   Good  Impulse Control:  Good   Risk Assessment: Danger to Self: No Self-injurious Behavior: No Danger to Others: No Physical Aggression / Violence: No Duty to Warn: No Access to Firearms a concern: No  Assessment of progress:  stabilized  Diagnosis:   ICD-10-CM   1. Moderate episode of recurrent major depressive disorder (HCC)  F33.1   2. Caregiver stress  Z63.6   3. 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 Insomnia, unspecified type  G47.00   5. History of posttraumatic stress  disorder (PTSD)  Z86.59     Plan:  . Self-affirm judgment with boundaries and exercise of compassion . Other recommendations/advice as noted above . Continue to utilize previously learned skills ad lib . Maintain medication as prescribed and work faithfully with relevant prescriber(s) if any changes are desired or seem indicated . Call the clinic on-call service, present to ER, or call 911 if any life-threatening psychiatric crisis Return for time as available. Current [facility] appointments: Future Appointments  Date Time Provider [place]  09/04/2019 10:30 AM Tommy Medal, Lavell Islam, MD [facility]  11/05/2019 10:00 AM Addison Lank, PA-C CP-CP None    [name], PhD [name], PhD LP Clinical Psychologist, [facility] [address], [place], [place] [address] [phone]

## 2019-09-04 ENCOUNTER — Other Ambulatory Visit: Payer: Self-pay

## 2019-09-04 ENCOUNTER — Ambulatory Visit (INDEPENDENT_AMBULATORY_CARE_PROVIDER_SITE_OTHER): Payer: Medicare HMO | Admitting: Infectious Disease

## 2019-09-04 ENCOUNTER — Encounter: Payer: Self-pay | Admitting: Infectious Disease

## 2019-09-04 DIAGNOSIS — Z889 Allergy status to unspecified drugs, medicaments and biological substances status: Secondary | ICD-10-CM | POA: Diagnosis not present

## 2019-09-04 DIAGNOSIS — A310 Pulmonary mycobacterial infection: Secondary | ICD-10-CM

## 2019-09-04 DIAGNOSIS — B4481 Allergic bronchopulmonary aspergillosis: Secondary | ICD-10-CM

## 2019-09-04 DIAGNOSIS — J479 Bronchiectasis, uncomplicated: Secondary | ICD-10-CM | POA: Diagnosis not present

## 2019-09-04 DIAGNOSIS — R69 Illness, unspecified: Secondary | ICD-10-CM | POA: Diagnosis not present

## 2019-09-04 DIAGNOSIS — F411 Generalized anxiety disorder: Secondary | ICD-10-CM

## 2019-09-04 DIAGNOSIS — L4050 Arthropathic psoriasis, unspecified: Secondary | ICD-10-CM | POA: Diagnosis not present

## 2019-09-04 HISTORY — DX: Bronchiectasis, uncomplicated: J47.9

## 2019-09-04 HISTORY — DX: Allergy status to unspecified drugs, medicaments and biological substances: Z88.9

## 2019-09-04 NOTE — Progress Notes (Signed)
Virtual Visit via Telephone Note  I connected with Sandra Burns on 09/04/19 at 10:30 AM EST by telephone and verified that I am speaking with the correct person using two identifiers.   I discussed the limitations, risks, security and privacy concerns of performing an evaluation and management service by telephone and the availability of in person appointments. I also discussed with the patient that there may be a patient responsible charge related to this service. The patient expressed understanding and agreed to proceed.   History of Present Illness:    Sandra Burns is a 63 year old Caucasian female with a past medical history for asthma, COPD, apparent mitral valve prolapse, atrial fibrillation who had developed dyspnea and ultimately underwent evaluation in the emergency department in December of this past year.  Her d-dimer was slightly positive and she underwent CT angiogram.  It was negative for pulmonary embolism but did show a Mild tree-in-bud nodularity in the right lower lobe.   She has been evaluated by pulmonary [place] who reviewed her films and found that she might have very mild bronchiectatic changes confined to the left and right upper lobe.  Patient has undergone repeat imaging in [place] which again showed some nodularity.  The question of whether she might have Mycobacterium avium was raised  Interim the patient who had not been coughing has been doing all sorts of maneuvers to try to get herself to cough up sputum including sleeping in a recliner and doing other maneuvers to get sputum up such as using a flutter valve.  She was able to get 3 sputum's for submission and 2 of the 3 did not grow any organisms the third grew Mycobacterium avium complex.  She didnot seem to cough at all except if she is trying to get sputum up at which she does cheat chiefly at night.  She is without fevers she is without weight loss.  She did however have some  dyspnea on exertion that is been going on for several months.  In talking to her during my EVISITS  I was not at all convinced that she has clear-cut active Mycobacterium avium infection.  Certainly she did snot meet ATS guidelines definition for such infection.  I have discussed with her that even if she did have evidence of Mycobacterium infection her symptom complex to me would not be wanting which I would be pushing hard for her to be on therapy.  After careful discussion I felt it prudent to not initiate therapy but to revisit how she is doing in roughly 3 to 4 months time.  We did contemplate repeating the CT scan 6 months after the most recent one which was done in January.  At last evisit she recounted that she  continued to have more cough at night and in particular in the recliner in certain posittions.  She had one evening x 1 week with night sweats one week after I spoke with her.  She is doing the "airway clearance breathing" technique to get sputum up.    .  Radiographic changes do not seem to be showing significant damage to the lungs.  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  09/04/2019: In talking today she says that  her lungs are "doing well."   She says she is doing a technique of graduated breathing she learned from support group, deep breath, blow out long and after 3-4, then breath  in and blow out all the way. It helps apparently move sputum from lower int he lungs up and allows her to get sputum out.  Sputum typically clear sputum  No recent low grade fevers  She lost weight but attributes this to stress related to her husband's four hospitalizations related to idiopathic necrotizing pancreatitis  She has eaten much much less.  She clarified that her reaction to azithromycin was that she had loose stools and was diagnosed with C. difficile  colitis.  Her allergies ciprofloxacin was 1 in which she had swelling in the arm where the infusion was taking place along with a rash.  She does state that she had mucous membrane involvement when given metronidazole for C. difficile colitis.  Amoxicillin was an allergy that caused itching and apparently some swelling but this happened more than 10 years ago and did not result in hospitalization.  I have updated the answers of the pertinent questions in the allergy portion.  She is continuing to shelter in place and is not visiting even grocery stores having all food and necessary items delivered to the house, or doing curbside pickup.  She states that she had some right-sided rib pain earlier on when she was diagnosed with having an AVM and this is now disappeared she wonders if it has to do with her being able to get sputum up from her lower lungs.  ROS as in hpi, otherwise 12 pt ros is negative  Past Medical History:  Diagnosis Date  . 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 Arrhythmia   . Arthritis   . Asthma   . Back pain   . Chronic obstructive airway disease ([place])   . Diverticulosis   . Fibromyalgia   . Fibromyalgia   . GERD (gastroesophageal reflux disease)   . Irritable bowel syndrome   . Kidney stone   . Lupus ([place])    "suspected"  . Migraine   . Mitral valve prolapse   . 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 Osteoporosis   . Paroxysmal A-fib ([place])   . Psoriatic arthritis ([place])   . Raynaud's disease   . Reactive airway disease   . S/P chemotherapy, time since greater than 12 weeks 1984   cervical ca    Past Surgical History:  Procedure Laterality Date  . ABDOMINAL HYSTERECTOMY    . BACK SURGERY    . BREAST BIOPSY Left 20+ YRS AGO   EXCISIONAL - NEG  . CHOLECYSTECTOMY    . OOPHORECTOMY      Family History  Problem Relation Age of Onset  . Breast cancer Neg Hx       Social History   Socioeconomic History  .  Marital status: Married    Spouse name: Not on file  . Number of children: Not on file  . Years of education: Not on file  . Highest education level: Not on file  Occupational History  . Occupation: Therapist, sports    Comment: Disabled since '09 Cardiopulmonary reasons  Social Needs  . Financial resource strain: Not on file  . Food insecurity    Worry: Not on file    Inability: Not on file  . Transportation needs    Medical: Not on file    Non-medical: Not on file  Tobacco Use  . Smoking status: Former Smoker    Quit date: 07/13/1981    Years since quitting: 38.1  . Smokeless tobacco: Never Used  Substance and Sexual Activity  . Alcohol use: Not  Currently    Comment: drank as a teen.  Not since  . Drug use: Not Currently    Comment: pot as a teen  . Sexual activity: Yes  Lifestyle  . Physical activity    Days per week: Not on file    Minutes per session: Not on file  . Stress: Not on file  Relationships  . Social Herbalist on phone: Not on file    Gets together: Not on file    Attends religious service: Not on file    Active member of club or organization: Not on file    Attends meetings of clubs or organizations: Not on file    Relationship status: Not on file  Other Topics Concern  . Not on file  Social History Narrative  . Not on file    Allergies  Allergen Reactions  . Albuterol Other (See Comments)    Atrial fibrillation Atrial fibrillation   . Amoxicillin Itching and Swelling    Has patient had a PCN reaction causing immediate rash, facial/tongue/throat swelling, SOB or lightheadedness with hypotension: Yes Has patient had a PCN reaction causing severe rash involving mucus membranes or skin necrosis: Yes Has patient had a PCN reaction that required hospitalization: Yes Has patient had a PCN reaction occurring within the last 10 years: Yes If all of the above answers are "NO", then may proceed with Cephalosporin use.   . Cephalosporins Itching and Rash     Rash and itching  . Ciprofloxacin Swelling  . Flagyl [Metronidazole] Dermatitis and Other (See Comments)    Other Reaction: SKIN SLOUGHING  . Macrobid [Nitrofurantoin Macrocrystal] Itching  . Sulfa Antibiotics Itching  . Vancomycin Shortness Of Breath  . Vicodin [Hydrocodone-Acetaminophen] Shortness Of Breath  . Zithromax [Azithromycin] Other (See Comments)    GI upset, C-diff  . Bacitracin-Neomycin-Polymyxin Itching and Swelling    Pt got the issue for the eye lids after instill the eyes drops.   . Tobramycin Itching and Swelling    Pt got the issue after instill the eyes drops  . Decongest-Aid [Pseudoephedrine] Palpitations  . Levofloxacin Rash    Itchy, swelling  . Prednisone Palpitations     Current Outpatient Medications:  .  ALPRAZolam (XANAX) 0.5 MG tablet, TAKE 1 & 1/2 TABS IN AM, 1 & 1/2 TABS AT LUNCH, 1 TAB AT DINNER, 1 & 1/2 TABS AT BEDTIME AS NEEDED, Disp: 165 tablet, Rfl: 3 .  Artificial Tear Ointment (REFRESH P.M. OP), Place 1 application into both eyes at bedtime. Apply eye gel to both eyelids, Disp: , Rfl:  .  aspirin 81 MG chewable tablet, Chew 81 mg by mouth daily., Disp: , Rfl:  .  atenolol (TENORMIN) 25 MG tablet, Take 12.5 mg by mouth 2 (two) times daily. , Disp: , Rfl:  .  budesonide (PULMICORT) 180 MCG/ACT inhaler, Inhale 2 puffs into the lungs 2 (two) times daily. , Disp: , Rfl:  .  dicyclomine (BENTYL) 10 MG capsule, TAKE 1 CAPSULE (10 MG TOTAL) BY MOUTH ONCE DAILY AS NEEDED, Disp: , Rfl:  .  dicyclomine (BENTYL) 20 MG tablet, Take 1 tablet (20 mg total) by mouth 2 (two) times daily. (Patient taking differently: Take 20 mg by mouth daily as needed for spasms. ), Disp: 20 tablet, Rfl: 0 .  doxycycline (VIBRA-TABS) 100 MG tablet, , Disp: , Rfl:  .  erythromycin ophthalmic ointment, APPLY A SMALL AMOUNT TO AFFECTED EYE 3X/DAY, Disp: , Rfl:  .  fluticasone (FLONASE) 50 MCG/ACT  nasal spray, Place 1 spray into both nostrils daily as needed for allergies., Disp: ,  Rfl: 6 .  ipratropium (ATROVENT HFA) 17 MCG/ACT inhaler, Inhale 2 puffs into the lungs 4 (four) times daily., Disp: , Rfl:  .  Multiple Vitamin (MULTIVITAMIN WITH MINERALS) TABS, Take 1 tablet by mouth daily., Disp: , Rfl:  .  mupirocin ointment (BACTROBAN) 2 %, APPLY TO AFFECTED AREA 3 TIMES A DAY, Disp: , Rfl:  .  pantoprazole (PROTONIX) 40 MG tablet, Take 40 mg by mouth daily as needed (GERD). For stomach , Disp: , Rfl:  .  Polyvinyl Alcohol-Povidone (REFRESH OP), Place 1 drop into both eyes daily as needed (dry eyes)., Disp: , Rfl:  .  ZIRGAN 0.15 % GEL, APPLY 1 A SMALL AMOUNT IN AFFECTED EYE 5 TIMES A DAY UNTIL DIRECTED, Disp: , Rfl:    Review of systems as noted in history of present illness and otherwise negative on 12 point review of systems    Observations/Objective:  Asthma and active airway disease: continues high-dose corticosteroid inhalers and beta agonist therapy  Mild bronchiectasis which seems confined to a few lobes of the lung  Positive culture for Mycobacterium avium which may represent a colonizer versus a true pathogen though she is not clearly showing sufficient evidence to me that this is the case. She wants to continue with her mechanical means of getting sputum up and is not wanting to start antimycobacterial antibiotics  Dyspnea on exertion able  Assessment and Plan:  Mycobacterium avium positive culture: She could certainly have an infection with this and does have some radiographic findings and does have significant sputum production though months of this is also her efforts to get the sputum out of her lungs she does not have anything and from a clinical standpoint that is pressing to make [place] want to initiate therapy at this point in time either.  I think is reasonable for her to continue her mechanical efforts to get sputum up. Repeat CT scan is reasonable but there is no urgency to do that right now and she certainly does not want to risk the chance of getting  Covid by going through unnecessary procedures or visits   Mild bronchiectasis: Defer to pulmonary regards to management of this.  Dyspnea: Could be multifactorial and will defer to her pulmonary physician her primary care physician and her cardiologist.  Weight loss: Try to put back on the weight that she lost.  She has again stated that she has had problems with appetite for many years and that particular when she is stressed she does not eat much.  Does not sound like this is coming from her mycobacteria  Weight loss:   Psoriatic arthritis: discussed when this was diagnosed  Mx allergies: clarified these  Follow Up Instructions:    I discussed the assessment and treatment plan with the patient. The patient was provided an opportunity to ask questions and all were answered. The patient agreed with the plan and demonstrated an understanding of the instructions.   The patient was advised to call back or seek an in-person evaluation if the symptoms worsen or if the condition fails to improve as anticipated.  I provided 32 minutes of non-face-to-face time during this encounter.   Alcide Evener, MD

## 2019-09-24 ENCOUNTER — Telehealth: Payer: Self-pay

## 2019-09-24 NOTE — Telephone Encounter (Signed)
Prior authorization faxed on 09/18/2019 for alprazolam 0.5 mg due to quantity limit #165 for 30 days, approved effective 09/27/2019-09/25/2020 through [facility] ID#[id number]

## 2019-10-10 ENCOUNTER — Telehealth: Payer: Self-pay | Admitting: Psychiatry

## 2019-10-10 NOTE — Telephone Encounter (Signed)
Calling re: her husband, [name]. He needs to get in to see you. Nothing is avail soon.

## 2019-10-10 NOTE — Telephone Encounter (Signed)
4pm Monday is open, [employer]

## 2019-10-14 ENCOUNTER — Ambulatory Visit (INDEPENDENT_AMBULATORY_CARE_PROVIDER_SITE_OTHER): Payer: Medicare HMO | Admitting: Psychiatry

## 2019-10-14 DIAGNOSIS — Z636 Dependent relative needing care at home: Secondary | ICD-10-CM

## 2019-10-14 DIAGNOSIS — F411 Generalized anxiety disorder: Secondary | ICD-10-CM

## 2019-10-14 DIAGNOSIS — A310 Pulmonary mycobacterial infection: Secondary | ICD-10-CM

## 2019-10-14 DIAGNOSIS — I499 Cardiac arrhythmia, unspecified: Secondary | ICD-10-CM

## 2019-10-14 DIAGNOSIS — F331 Major depressive disorder, recurrent, moderate: Secondary | ICD-10-CM | POA: Diagnosis not present

## 2019-10-14 DIAGNOSIS — Z62820 Parent-biological child conflict: Secondary | ICD-10-CM

## 2019-10-14 DIAGNOSIS — Z8659 Personal history of other mental and behavioral disorders: Secondary | ICD-10-CM

## 2019-10-14 DIAGNOSIS — Z630 Problems in relationship with spouse or partner: Secondary | ICD-10-CM
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
## 2019-10-21 ENCOUNTER — Ambulatory Visit: Payer: Medicare HMO | Admitting: Psychiatry

## 2019-11-05 ENCOUNTER — Ambulatory Visit (INDEPENDENT_AMBULATORY_CARE_PROVIDER_SITE_OTHER): Payer: Medicare HMO | Admitting: Physician Assistant

## 2019-11-05 ENCOUNTER — Encounter: Payer: Self-pay | Admitting: Physician Assistant

## 2019-11-05 DIAGNOSIS — F4310 Post-traumatic stress disorder, unspecified: Secondary | ICD-10-CM | POA: Diagnosis not present

## 2019-11-05 DIAGNOSIS — F411 Generalized anxiety disorder: Secondary | ICD-10-CM | POA: Diagnosis not present

## 2019-11-05 DIAGNOSIS — F3341 Major depressive disorder, recurrent, in partial remission: Secondary | ICD-10-CM

## 2019-11-05 MED ORDER — ALPRAZOLAM 0.5 MG PO TABS: ORAL_TABLET | ORAL | 5 refills | Status: DC

## 2019-11-05 NOTE — Progress Notes (Signed)
[facility]  [name],  MRN: [id number]  PCP: Tracey Harries, FNP  Date of Evaluation: 11/05/2019 Time spent:20 minutes  Chief Complaint:  Chief Complaint    Follow-up; Anxiety     Virtual Visit via Telephone Note  I connected with patient by a video enabled telemedicine application or telephone, with their informed consent, and verified patient privacy and that I am speaking with the correct person using two identifiers.  I am private, in my office and the patient is at home.   I discussed the limitations, risks, security and privacy concerns of performing an evaluation and management service by telephone and the availability of in person appointments. I also discussed with the patient that there may be a patient responsible charge related to this service. The patient expressed understanding and agreed to proceed.   I discussed the assessment and treatment plan with the patient. The patient was provided an opportunity to ask questions and all were answered. The patient agreed with the plan and demonstrated an understanding of the instructions.   The patient was advised to call back or seek an in-person evaluation if the symptoms worsen or if the condition fails to improve as anticipated.  I provided 20 minutes of non-face-to-face time during this encounter.  HISTORY/CURRENT STATUS: Anxiety     For routine med check.  Has still been very stressed. Her husband still isn't doing well.  He's been in the hospital again but is now home again. "It's been a very stressful time but I think the Xanax is working. I think my medicine is working fine under the circumstances."   Is extremely upset w/ [facility] right now.  "The communication has been horrible and there have been 2 serious medical errors caused by residents there that are unacceptable. 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  I'm tired, but I'm working w/ Dr. Farrel Demark and it's helping."  Patient denies increased energy with decreased need for sleep, no increased talkativeness, no racing thoughts, no impulsivity or risky behaviors, no increased spending, no increased libido, no grandiosity.  Denies dizziness, syncope, seizures, numbness, tingling, tremor, tics, unsteady gait, slurred speech, confusion. Denies muscle or joint pain, stiffness, or dystonia.  Individual Medical History/ Review of Systems: Changes? :No    Past medications for mental health diagnoses include: Zoloft, caused psychosis w/ hallucinations,  BuSpar, Ativan, Effexor (became psychotic) She was told to never take any kind of SSRI/SNRI again.  Allergies: Albuterol, Cephalosporins, Flagyl [metronidazole], Macrobid [nitrofurantoin macrocrystal], Sulfa antibiotics, Vancomycin, Vicodin [hydrocodone-acetaminophen], Amoxicillin, Ciprofloxacin, Bacitracin-neomycin-polymyxin, Tobramycin, Decongest-aid [pseudoephedrine], Levofloxacin, Prednisone, and Zithromax [azithromycin]  Current Medications:  Current Outpatient Medications:  .  ALPRAZolam (XANAX) 0.5 MG tablet, TAKE 1 & 1/2 TABS IN AM, 1 & 1/2 TABS AT LUNCH, 1 TAB AT DINNER, 1 & 1/2 TABS AT BEDTIME AS NEEDED, Disp: 165 tablet, Rfl: 3 .  Artificial Tear Ointment (REFRESH P.M. OP), Place 1 application into both eyes at bedtime. Apply eye gel to both eyelids, Disp: , Rfl:  .  aspirin 81 MG chewable tablet, Chew 81 mg by mouth daily., Disp: , Rfl:  .  atenolol (TENORMIN) 25 MG tablet, Take 12.5 mg by mouth 2 (two) times daily. , Disp: , Rfl:  .  budesonide (PULMICORT) 180 MCG/ACT inhaler, Inhale 2 puffs into the lungs 2 (two) times daily. , Disp: , Rfl:  .  dicyclomine (BENTYL) 10  MG capsule, TAKE 1 CAPSULE (10 MG TOTAL) BY MOUTH ONCE DAILY AS NEEDED, Disp: , Rfl:  .  dicyclomine (BENTYL) 20 MG tablet, Take 1 tablet (20 mg total) by mouth 2 (two) times daily. (Patient not taking: Reported on 11/05/2019), Disp:  20 tablet, Rfl: 0 .  doxycycline (VIBRA-TABS) 100 MG tablet, , Disp: , Rfl:  .  erythromycin ophthalmic ointment, APPLY A SMALL AMOUNT TO AFFECTED EYE 3X/DAY, Disp: , Rfl:  .  fluticasone (FLONASE) 50 MCG/ACT nasal spray, Place 1 spray into both nostrils daily as needed for allergies., Disp: , Rfl: 6 .  ipratropium (ATROVENT HFA) 17 MCG/ACT inhaler, Inhale 2 puffs into the lungs 4 (four) times daily., Disp: , Rfl:  .  Multiple Vitamin (MULTIVITAMIN WITH MINERALS) TABS, Take 1 tablet by mouth daily., Disp: , Rfl:  .  mupirocin ointment (BACTROBAN) 2 %, APPLY TO AFFECTED AREA 3 TIMES A DAY, Disp: , Rfl:  .  pantoprazole (PROTONIX) 40 MG tablet, Take 40 mg by mouth daily as needed (GERD). For stomach , Disp: , Rfl:  .  Polyvinyl Alcohol-Povidone (REFRESH OP), Place 1 drop into both eyes daily as needed (dry eyes)., Disp: , Rfl:  .  ZIRGAN 0.15 % GEL, APPLY 1 A SMALL AMOUNT IN AFFECTED EYE 5 TIMES A DAY UNTIL DIRECTED, Disp: , Rfl:  Medication Side Effects: none  Family Medical/ Social History: Changes? No  MENTAL HEALTH EXAM:  There were no vitals taken for this visit.There is no height or weight on file to calculate BMI.  General Appearance: unable to assess  Eye Contact:  unable to assess  Speech:  Clear and Coherent  Volume:  Normal  Mood:  Irritable  Affect:  unable to assess  Thought Process:  Goal Directed and Descriptions of Associations: Intact  Orientation:  Full (Time, Place, and Person)  Thought Content: Logical   Suicidal Thoughts:  No  Homicidal Thoughts:  No  Memory:  WNL  Judgement:  Good  Insight:  Good  Psychomotor Activity:  unable to assess  Concentration:  Concentration: Good  Recall:  Good  Fund of Knowledge: Good  Language: Good  Assets:  Desire for Improvement  ADL's:  Intact  Cognition: WNL  Prognosis:  Good    DIAGNOSES:    ICD-10-CM   1. Generalized anxiety disorder  F41.1   2. Recurrent major depressive disorder, in partial remission ([place])  F33.41    3. PTSD (post-traumatic stress disorder)  F43.10     Receiving Psychotherapy: Yes w/ Dr. Jonni Sanger Mitchum   RECOMMENDATIONS:  We discussed again the fact that she is unable to take any antidepressants or BuSpar that may help prevent the anxiety.  It would be ideal if she could. Continue Xanax 0.5 mg, 1.5 pills every morning, 1.5 pill at lunch, 1 pill at dinner, 1.5 at bedtime as needed. Continue psychotherapy with Dr. [name].  Return in 4 months.  Donnal Moat, PA-C

## 2019-11-20 ENCOUNTER — Ambulatory Visit (INDEPENDENT_AMBULATORY_CARE_PROVIDER_SITE_OTHER): Payer: Medicare HMO | Admitting: Psychiatry

## 2019-11-20 DIAGNOSIS — F331 Major depressive disorder, recurrent, moderate: Secondary | ICD-10-CM | POA: Diagnosis not present

## 2019-11-20 DIAGNOSIS — A310 Pulmonary mycobacterial infection: Secondary | ICD-10-CM

## 2019-11-20 DIAGNOSIS — L4050 Arthropathic psoriasis, unspecified: Secondary | ICD-10-CM

## 2019-11-20 DIAGNOSIS — Z62820 Parent-biological child conflict: Secondary | ICD-10-CM

## 2019-11-20 DIAGNOSIS — F40298 Other specified phobia: Secondary | ICD-10-CM

## 2019-11-20 DIAGNOSIS — Z636 Dependent relative needing care at home: Secondary | ICD-10-CM

## 2019-11-20 DIAGNOSIS — Z8659 Personal history of other mental and behavioral disorders: Secondary | ICD-10-CM

## 2019-11-20 NOTE — Progress Notes (Signed)
Psychotherapy Progress Note [facility] [name], PhD LP  [name]     MRN: [id number] Therapy format: Individual psychotherapy Date: 11/20/2019      Start: 6:22p     Stop: 7:10p     Time Spent: 48 min Location: Telehealth visit -- I connected with this patient by an approved telecommunication method (video), with her informed consent, and verifying identity and patient privacy.  I was located at my office and patient at her home.  As needed, we discussed the limitations, risks, and security and privacy concerns associated with telehealth service, including the availability and conditions which currently govern in-person appointments and the possibility that 3rd-party payment may not be fully guaranteed and she may be responsible for charges.  After she indicated understanding, we proceeded with the session.  Also discussed treatment planning, as needed, including ongoing verbal agreement with the plan, the opportunity to ask and answer all questions, her demonstrated understanding of instructions, and her readiness to call the office should symptoms worsen or she feels she is in a crisis state and needs more immediate and tangible assistance.   Session narrative (presenting needs, interim history, self-report of stressors and symptoms, applications of prior therapy, status changes, and interventions made in session) Fatigued.  May have UTI or kidney stone, has begun antibiotic on her own initiative.  Psoriatic arthritis flaring.  Felt like she passed a kidney stone yesterday, with reduction in back pain, but set up for urinalysis tomorrow.  Trying to prevent the need by self-treating with antibiotic.  Re. [name], he is home at this point.  Was rehospitalized after stent placement with an infection and edema.  Had a long procedure after finding out his pancreatic stent had migrated and was risking perforating intestine.  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  Hoping to prevent a Whipple procedure.  Likely 2-4 weeks, robotic procedure.  Has had to deal with a couple more medical mistakes and losing her cool, but processed.  Clear stress of him wanting her to be near as he goes through things, and from his variable mental status.  Reports NMs now of [facility] hospital, going through the environment without a mask and being unable to find one.  Seems clearly related to plausibly deadly COVID and mycobacterium risks to her health, and staying at a hotel in [place] when [place] is hosp'd.  Masks also tend to irritate her lungs (paper ones, anyway).  Has figured out a change of lodging (closer to garage) and some tactics for controlling contamination risk (separate bedroom shoes, portable hamper).  Notes she has phobias "passed on by Dad" -- highway driving, some food issues -- and childhood learning to be more nervous about them.    Re. her general health, weight holding steady about 103, using Boost and other supplements when necessary.  Can have aversion to canned foods based on history of mother telling stories about botulism, but also successfully ate a can of beans.  Also has to contend with suppressed appetite since her gall bladder surgery.  Finds she can eat a couple chunks of butter just fine and does get some supplemental calories that way.  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  Can test her own urine, finds some markers.  Psychologically, contending with internal pressure that she has to stay well for [facility].  Some degree of pressure here, but not letting it become obsessive or codependent, just sobering acknowledgment that she is the capable one now, despite her own chronic medical condition.  Communication holding  fair.  Says her friend Doctor, general practice (a fellow PT) also "driving me crazy" -- really concerned about her emotional state, getting hostile about a personality conflict going on among [place] and a consensus toxic person there.  Feels [name]  may be setting herself up to spoil her own reputation, sees her displacing anxiety/anger from personal life onto this coworker situation.  Having prior consent to respond, offered "psychological first aid" tactics for helping her friend de-escalate resentful anger.  Relates some to her intractable issue with son Shea Evans.  Has come to some peace that she "had" -- and "lost" -- a son, finds herself able to go without resentment that way, treating him as a child lost in adulthood.  Affirmed as constructive, and apparently true in every important sense, not her obligation, certainly, to stew or try to make work what he will not allow, only offer what can reasonably be offered to the living and let them choose.  Framed more than one "right" thing to do, which would include messages, gifts insofar as she is moved to do for son and grandchild she cannot see right now.  Therapeutic modalities: Cognitive Behavioral Therapy and Solution-Oriented/Positive Psychology  Mental Status/Observations:  Appearance:   Casual     Behavior:  Appropriate  Motor:  Normal  Speech/Language:   Clear and Coherent  Affect:  Appropriate  Mood:  dysthymic and circumspect  Thought process:  normal  Thought content:    WNL  Sensory/Perceptual disturbances:    WNL  Orientation:  Fully oriented  Attention:  Good  Concentration:  Fair  Memory:  grossly intact  Insight:    Good  Judgment:   Good  Impulse Control:  Good   Risk Assessment: Danger to Self: No Self-injurious Behavior: No Danger to Others: No Physical Aggression / Violence: No Duty to Warn: No Access to Firearms a concern: No  Assessment of progress:  progressing  Diagnosis:   ICD-10-CM   1. Moderate episode of  recurrent major depressive disorder (HCC)  F33.1   2. Caregiver stress  Z63.6   3. 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 Relationship problem between parent and child  Z62.820   5. History of posttraumatic stress disorder (PTSD)  Z86.59   6. 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 Psoriatic arthritis ([place])  L40.50    w/ associated other AI sxs   Plan:  . Ongoing option to be in touch or out of touch with son Shea Evans -- just maintain good reasons and make short work of resentment . Maintain weight/nutrition . Option to address phobias more directly once illness and husband's issues are more in hand . Other recommendations/advice as may be noted above . Continue to utilize previously learned skills ad lib . Maintain medication as prescribed and work faithfully with relevant prescriber(s) if any changes are desired or seem indicated . Call the clinic on-call service, present to ER, or call 911 if any life-threatening psychiatric crisis . Return in about 3 weeks (around 12/11/2019) for time at discretion.Marland Kitchen  Next scheduled visit in this office 05/04/2020.  [name], PhD [name], PhD LP Clinical Psychologist, [facility] [address] [place], [place] [address] ([phone]

## 2019-12-12 ENCOUNTER — Encounter: Payer: Self-pay | Admitting: Urology

## 2019-12-12 ENCOUNTER — Ambulatory Visit: Payer: Self-pay | Admitting: Urology

## 2019-12-13 ENCOUNTER — Ambulatory Visit: Payer: Medicare HMO

## 2019-12-17 ENCOUNTER — Ambulatory Visit (INDEPENDENT_AMBULATORY_CARE_PROVIDER_SITE_OTHER): Payer: Medicare HMO | Admitting: Psychiatry

## 2019-12-17 DIAGNOSIS — F4323 Adjustment disorder with mixed anxiety and depressed mood: Secondary | ICD-10-CM

## 2019-12-17 DIAGNOSIS — Z636 Dependent relative needing care at home: Secondary | ICD-10-CM | POA: Diagnosis not present

## 2019-12-17 DIAGNOSIS — F331 Major depressive disorder, recurrent, moderate: Secondary | ICD-10-CM

## 2019-12-17 DIAGNOSIS — Z8659 Personal history of other mental and behavioral disorders: Secondary | ICD-10-CM

## 2019-12-17 DIAGNOSIS — Z638 Other specified problems related to primary support group: Secondary | ICD-10-CM

## 2019-12-17 DIAGNOSIS — F40298 Other specified phobia: Secondary | ICD-10-CM | POA: Diagnosis not present

## 2019-12-17 NOTE — Progress Notes (Signed)
Psychotherapy Progress Note [facility] [name], PhD LP  [name]     MRN: [id number] Therapy format: Individual psychotherapy Date: 12/17/2019      Start: 3:16p     Stop: 4:16p     Time Spent: 50 min (remainder donated) Location: Telehealth visit -- I connected with this patient by an approved telecommunication method (video), with her informed consent, and verifying identity and patient privacy.  I was located at my office and patient at her home.  As needed, we discussed the limitations, risks, and security and privacy concerns associated with telehealth service, including the availability and conditions which currently govern in-person appointments and the possibility that 3rd-party payment may not be fully guaranteed and she may be responsible for charges.  After she indicated understanding, we proceeded with the session.  Also discussed treatment planning, as needed, including ongoing verbal agreement with the plan, the opportunity to ask and answer all questions, her demonstrated understanding of instructions, and her readiness to call the office should symptoms worsen or she feels she is in a crisis state and needs more immediate and tangible assistance.   Session narrative (presenting needs, interim history, self-report of stressors and symptoms, applications of prior therapy, status changes, and interventions made in session) [name] has his big surgery in 2 days (to connect his pancreatic cyst to the alimentary canal to dump), finds her apprehension rising.  First trip was surprise, emergency, went into action.  Second trip at her birthday was also fast-moving.  This one has had a month notice, which is plenty of time to worry and be exposed to uncertainty.    Knows she worries about possible complications (e.g., remove pancreas altogether), and whether he might die in it.  The couple have talked about death and their faith in the afterlife, but  it would still be a shock, despite months of warming up to the possibility, and in light of her own chronic illness seeming to set her up to pass first.  Interpreted partly experiencing just that high degree of uncertainty in life these days, with life and death potentially in the balance, plus the posttraumatic effect of any significant uncertainty being a higher anxiety trigger as part of the pattern that preceded shocking experiences.  Meanwhile, feeling a moral dilemma about holding a secret from someone she loves Emergency planning/management officer, the fellow patient).  [name] called her bawling recently, because of fallout from a political maneuver in the [facility] invalidating [name]'s work, undermining her position, and effectively shunning her while they set up to accept a new member keeping [name] in the dark.  Found out a discrediting rumor circulating that [name] is unstable because of pain medication and having a grandchild to take care of.  Upsetting on both counts, the moral failure of leadership, and the rumor mill, not to mention disparaging word about [name]'s involvement with the historical association.  Seeing enough snakes in the grass to want to resign the [facility] altogether and only keep up with her [employer].  Wrestling with whether to inform [name], fears the leadership is getting ready to kick her out, in what is, socially, a lynching.  PT's perspective that there is a Leisure centre manager in the organization (who lost election to [place] last fall), [name]'s reactive nature when offended or impugned, and a proud, Dentist, all at the same time.  Honest appraisal that [name]'s instincts were wired too tight, but true in what she thought to be happening.  Validated  PT's discretion about what to reveal and what not, in what is plainly an act of caring to hold back triggering a reactive friend.  Clarified how she was both honest and fair in how she addressed probing questions she knew the answers to.  The scenario  affecting her friend reminds her of her son [name] tactics, demonizing her and [name] to his girlfriends before they ever met, and setting them up to react to innocent moments in ways that doomed the potential relationship.  Illuminating reading, The Sociopath Next Door.  Realizes she is simultaneously dealing with three imperfect situations, filled with uncertainty, in touch with COVID and its risks to her and [name], the medical risks inherent in [name]'s situation and hardships of recovery, and the ugliness with [employer] politics and [name].  In closing, turned back to most immediate concerns, endorsed it as OK to package [name]'s situation, knowing she had filled in background [place] will need to quickly understand next time [name] is seen, and entrusting [name], other supports, and God to work on her adjustment.  Affirmed and encouraged in staying real, emotionally honest with [name], and entrusting the rest of this to God herself.  Therapeutic modalities: Cognitive Behavioral Therapy, Solution-Oriented/Positive Psychology and Faith-sensitive  Mental Status/Observations:  Appearance:   Casual     Behavior:  Appropriate  Motor:  Normal  Speech/Language:   Clear and Coherent  Affect:  Appropriate  Mood:  anxious and depressed  Thought process:  normal, circumspect despite stress  Thought content:    WNL  Sensory/Perceptual disturbances:    WNL  Orientation:  Fully oriented  Attention:  Good  Concentration:  Good  Memory:  WNL  Insight:    Good  Judgment:   Good  Impulse Control:  Good   Risk Assessment: Danger to Self: No Self-injurious Behavior: No Danger to Others: No Physical Aggression / Violence: No Duty to Warn: No Access to Firearms a concern: No  Assessment of progress:  situational setback(s)  Diagnosis:   ICD-10-CM   1. Moderate episode of recurrent major depressive disorder (HCC)  F33.1   2. History of posttraumatic stress disorder (PTSD)  Z86.59   3. Caregiver stress   Z63.6   4. 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 Adjustment disorder with mixed anxiety and depressed mood  F43.23   6. Relationship problem with family member (estrangment with son)  Z82.8    Plan:  Marland Kitchen Entrust [name]'s situation to others, focus on loving and companioning as she can with [name] the next 48 hrs . Other recommendations/advice as may be noted above . Continue to utilize previously learned skills ad lib . Maintain medication as prescribed and work faithfully with relevant prescriber(s) if any changes are desired or seem indicated . Call the clinic on-call service, present to ER, or call 911 if any life-threatening psychiatric crisis . Return for time at discretion.Marland Kitchen  Next scheduled visit in this office 05/04/2020.  [name], PhD [name], PhD LP Clinical Psychologist, [facility] [address], [place], [place] [address] [phone]

## 2020-01-24 IMAGING — CT CT ABDOMEN AND PELVIS WITH CONTRAST
2 of 5 series · 16 of 46 positions shown, 18 images · IV contrast (omnipaque)
Comparison: 11/09/2017

CLINICAL DATA: Right lower quadrant pain

EXAM:
CT ABDOMEN AND PELVIS WITH CONTRAST
TECHNIQUE: Multidetector CT imaging of the abdomen and pelvis was performed
using the standard protocol following bolus administration of
intravenous contrast.
CONTRAST:  75mL OMNIPAQUE IOHEXOL 300 MG/ML  SOLN

[Series 3: abd pelvis · axial · 0.64mm/px · z∈[-1490,-1100]mm · 13 of 88 slices shown, 15 images]
[im 5/88  soft-tissue]
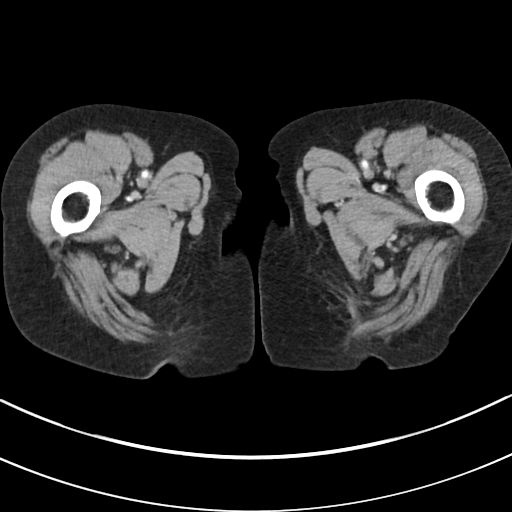
[im 5/88  bone]
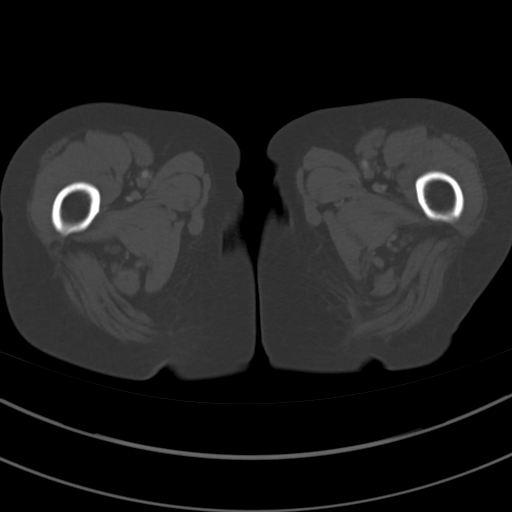
[im 10/88  soft-tissue]
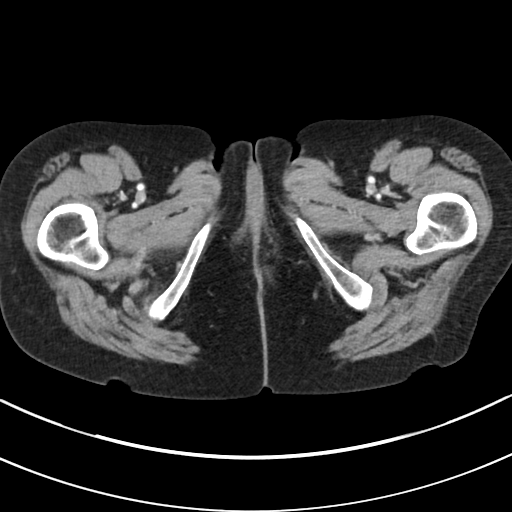
[im 20/88  soft-tissue]
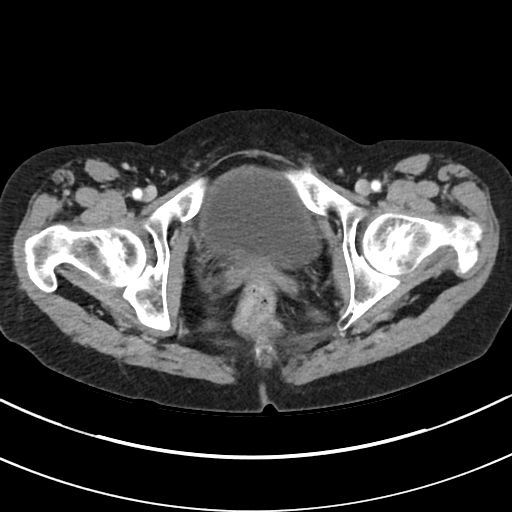
[im 25/88  soft-tissue]
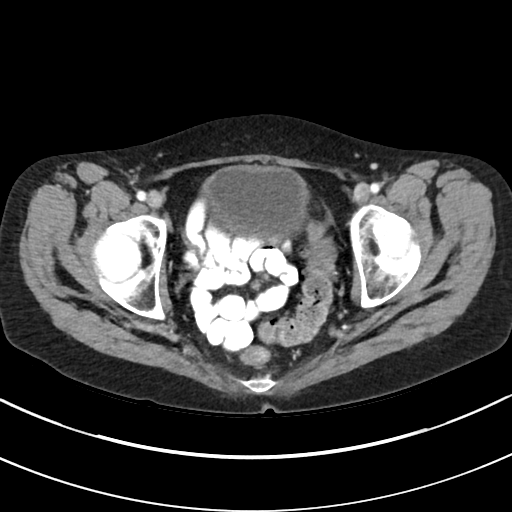
[im 30/88  soft-tissue]
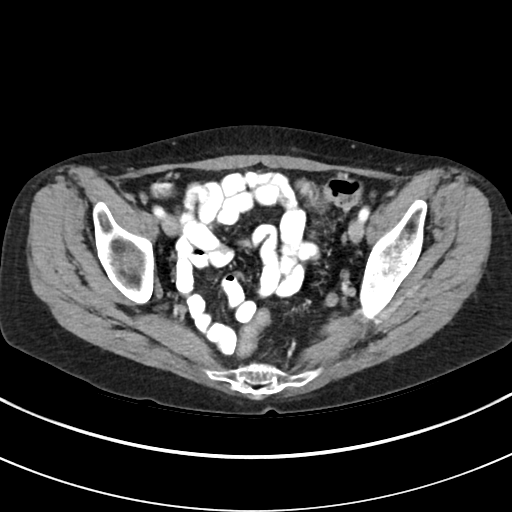
[im 39/88  soft-tissue]
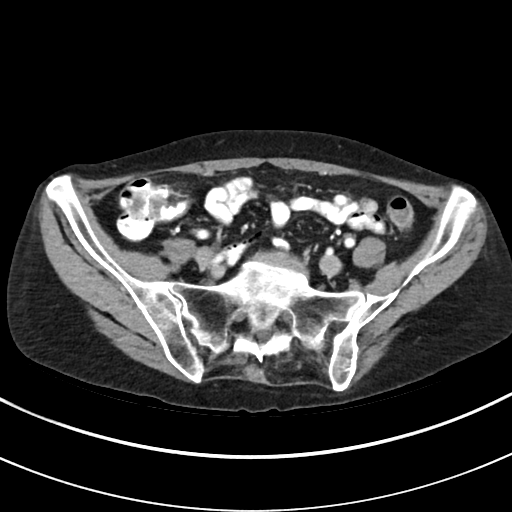
[im 44/88  soft-tissue]
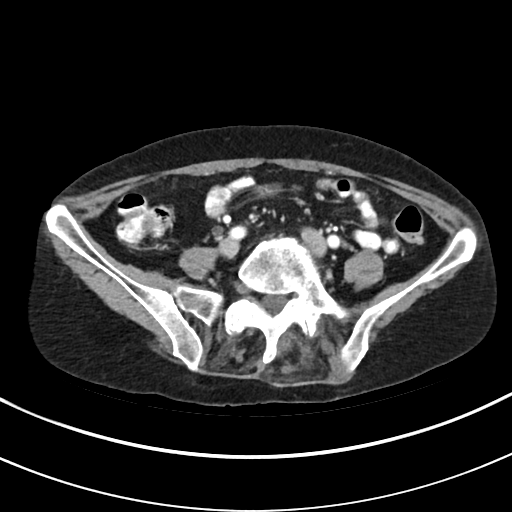
[im 49/88  soft-tissue]
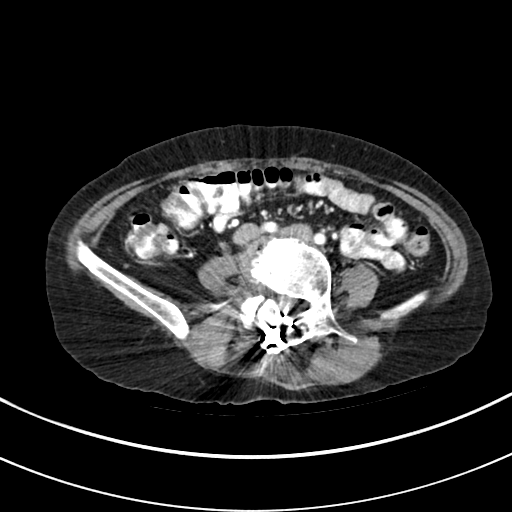
[im 59/88  soft-tissue]
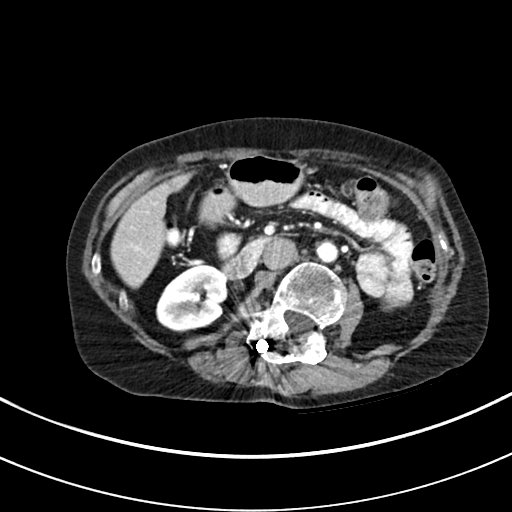
[im 59/88  bone]
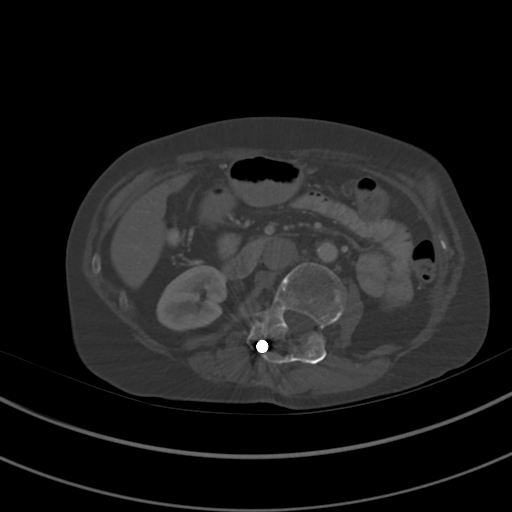
[im 63/88  soft-tissue]
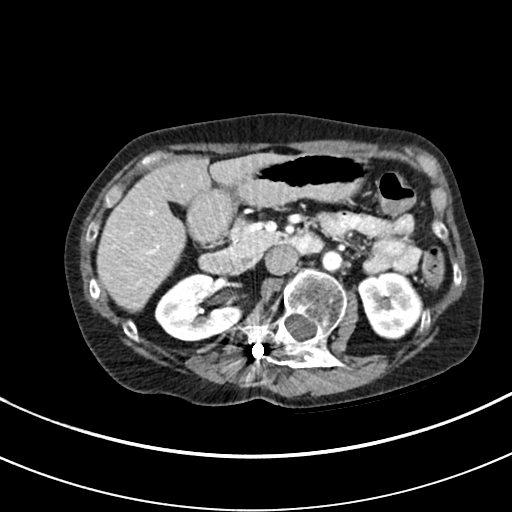
[im 68/88  soft-tissue]
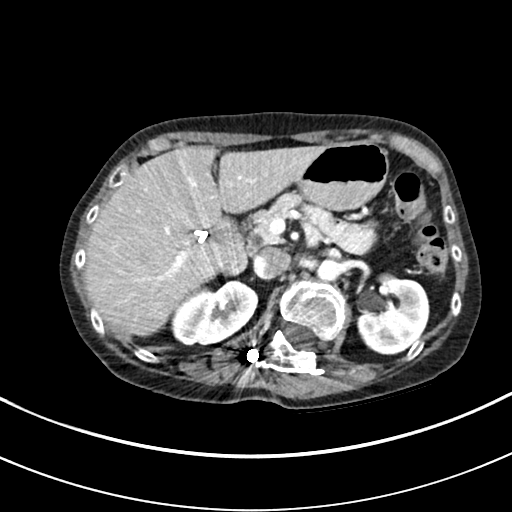
[im 78/88  soft-tissue]
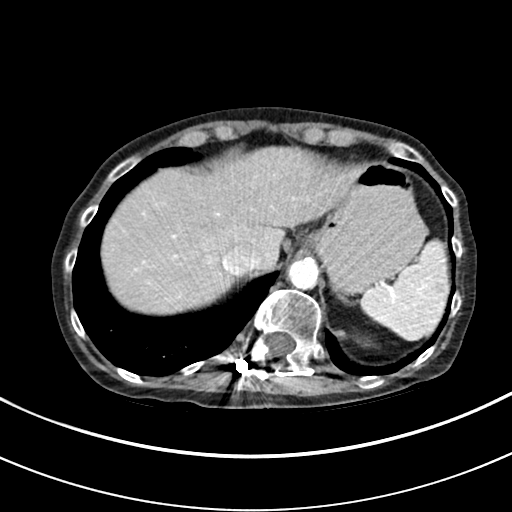
[im 83/88  soft-tissue]
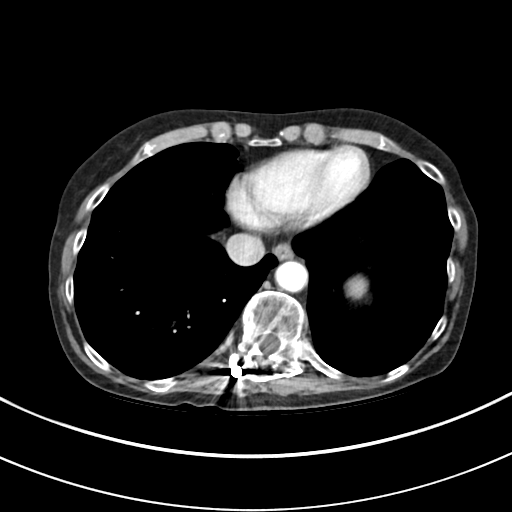

[Series 5: coronal abd pelvis (person_name) · coronal · 0.64mm/px · 3 of 107 slices shown]
[im 36/107  soft-tissue]
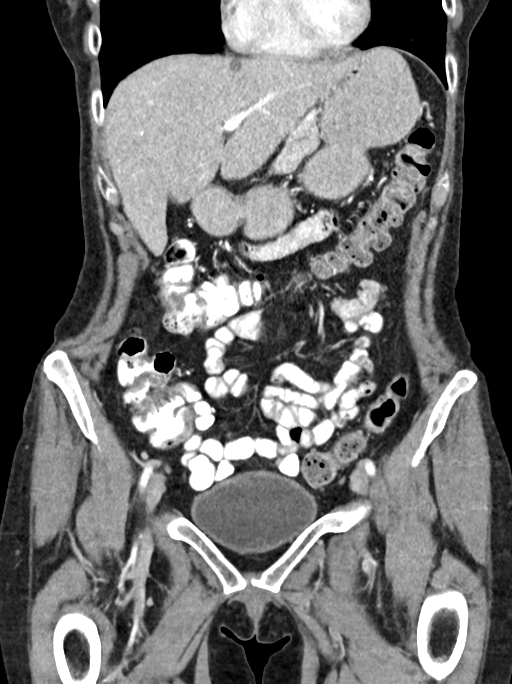
[im 48/107  soft-tissue]
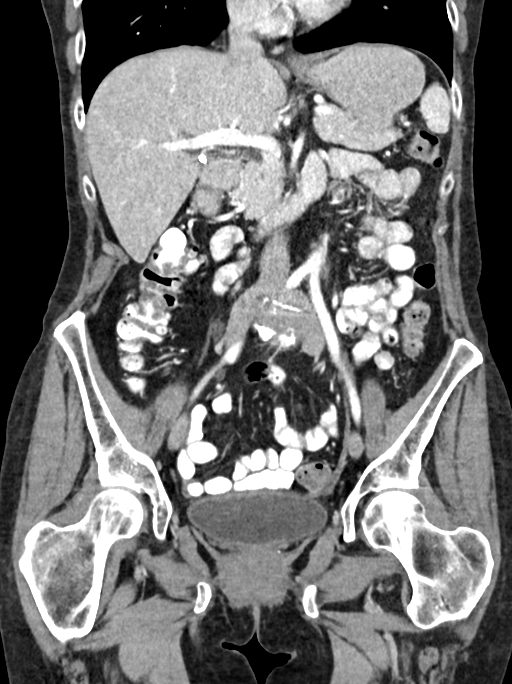
[im 59/107  soft-tissue]
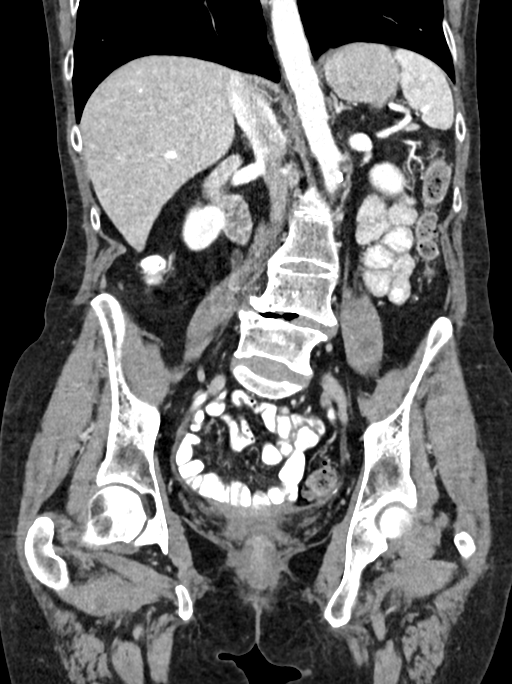

[16 of 46 positions shown; findings below may reference images not displayed]

FINDINGS: Lower chest: bronchiectasis and scarring in the lingula, unchanged.
No acute abnormality.

Hepatobiliary: No focal liver abnormality is seen. Status post
cholecystectomy. No biliary dilatation. Small cyst in the left
hepatic dome.

Pancreas: No focal abnormality or ductal dilatation.

Spleen: Calcification in the spleen.  Normal size.

Adrenals/Urinary Tract: No renal or ureteral stones. No
hydronephrosis. Adrenal glands and urinary bladder unremarkable.

Stomach/Bowel: Normal appendix. Sigmoid diverticulosis. No active
diverticulitis. Stomach and small bowel unremarkable.

Vascular/Lymphatic: Aortic atherosclerosis. No enlarged abdominal or
pelvic lymph nodes.

Reproductive: Prior hysterectomy.  No adnexal masses.

Other: No free fluid or free air.

Musculoskeletal: No acute bony abnormality. Scoliosis, degenerative
changes and postoperative changes in the lumbar spine.
IMPRESSION: Normal appendix.

Aortic atherosclerosis.

Sigmoid diverticulosis.  No active diverticulitis.

No acute findings.

## 2020-02-18 ENCOUNTER — Ambulatory Visit (INDEPENDENT_AMBULATORY_CARE_PROVIDER_SITE_OTHER): Payer: Medicare HMO | Admitting: Psychiatry

## 2020-02-18 DIAGNOSIS — F331 Major depressive disorder, recurrent, moderate: Secondary | ICD-10-CM | POA: Diagnosis not present

## 2020-02-18 DIAGNOSIS — Z8659 Personal history of other mental and behavioral disorders: Secondary | ICD-10-CM

## 2020-02-18 DIAGNOSIS — L4050 Arthropathic psoriasis, unspecified: Secondary | ICD-10-CM

## 2020-02-18 DIAGNOSIS — Z638 Other specified problems related to primary support group: Secondary | ICD-10-CM

## 2020-02-18 DIAGNOSIS — A310 Pulmonary mycobacterial infection: Secondary | ICD-10-CM

## 2020-02-18 DIAGNOSIS — Z636 Dependent relative needing care at home: Secondary | ICD-10-CM | POA: Diagnosis not present

## 2020-02-18 DIAGNOSIS — F4323 Adjustment disorder with mixed anxiety and depressed mood: Secondary | ICD-10-CM
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
## 2020-03-02 ENCOUNTER — Ambulatory Visit: Payer: Medicare HMO | Admitting: Infectious Disease

## 2020-03-02 ENCOUNTER — Other Ambulatory Visit: Payer: Self-pay

## 2020-03-04 ENCOUNTER — Telehealth (INDEPENDENT_AMBULATORY_CARE_PROVIDER_SITE_OTHER): Payer: Medicare HMO | Admitting: Infectious Disease

## 2020-03-04 ENCOUNTER — Other Ambulatory Visit: Payer: Self-pay

## 2020-03-04 DIAGNOSIS — I341 Nonrheumatic mitral (valve) prolapse: Secondary | ICD-10-CM | POA: Diagnosis not present

## 2020-03-04 DIAGNOSIS — J471 Bronchiectasis with (acute) exacerbation: Secondary | ICD-10-CM

## 2020-03-04 DIAGNOSIS — A310 Pulmonary mycobacterial infection: Secondary | ICD-10-CM | POA: Diagnosis not present

## 2020-03-04 DIAGNOSIS — B4481 Allergic bronchopulmonary aspergillosis: Secondary | ICD-10-CM

## 2020-03-04 NOTE — Progress Notes (Signed)
Virtual Visit via Video Note  I connected with Sandra Burns on 03/04/20 at 10:45 AM EDT by a video enabled telemedicine application and verified that I am speaking with the correct person using two identifiers.  Location: Patient: Home Provider: [facility]   I discussed the limitations of evaluation and management by telemedicine and the availability of in person appointments. The patient expressed understanding and agreed to proceed.  History of Present Illness:   Sandra Burns is a 64 year old Caucasian female with a past medical history for asthma, COPD, apparent mitral valve prolapse, atrial fibrillation who had developed dyspnea and ultimately underwent evaluation in the emergency department in December of this past year.  Her d-dimer was slightly positive and she underwent CT angiogram.  It was negative for pulmonary embolism but did show a Mild tree-in-bud nodularity in the right lower lobe.   She has been evaluated by pulmonary [place] who reviewed her films and found that she might have very mild bronchiectatic changes confined to the left and right upper lobe.  Patient has undergone repeat imaging in [place] which again showed some nodularity.  The question of whether she might have Mycobacterium avium was raised  Interim the patient who had not been coughing has been doing all sorts of maneuvers to try to get herself to cough up sputum including sleeping in a recliner and doing other maneuvers to get sputum up such as using a flutter valve.  She was able to get 3 sputum's for submission and 2 of the 3 did not grow any organisms the third grew Mycobacterium avium complex.  She didnot seem to cough at all except if she is trying to get sputum up at which she does cheat chiefly at night.  She is without fevers she is without weight loss.  She did however have some dyspnea on exertion that is been going on for several months.  In talking to her during  my EVISITS  I was not at all convinced that she has clear-cut active Mycobacterium avium infection.  Certainly she did snot meet ATS guidelines definition for such infection.  I have discussed with her that even if she did have evidence of Mycobacterium infection her symptom complex to me would not be wanting which I would be pushing hard for her to be on therapy.  After careful discussion I felt it prudent to not initiate therapy but to revisit how she is doing in roughly 3 to 4 months time.  We did contemplate repeating the CT scan 6 months after the most recent one which was done in January.  09/04/2019: In talking today she says that  her lungs are "doing well."   She says she is doing a technique of graduated breathing she learned from support group, deep breath, blow out long and after 3-4, then breath in and blow out all the way. It helps apparently move sputum from lower int he lungs up and allows her to get sputum out.  Sputum typically clear sputum  No recent low grade fevers  She lost weight but attributes this to stress related to her husband's four hospitalizations related to idiopathic necrotizing pancreatitis  She has eaten much much less.  She clarified that her reaction to azithromycin was that she had loose stools and was diagnosed with C. difficile colitis.  Her allergies ciprofloxacin was 1 in which she had swelling in the arm where the infusion was taking place along with a rash.  She does state that she had mucous  membrane involvement when given metronidazole for C. difficile colitis.  Amoxicillin was an allergy that caused itching and apparently some swelling but this happened more than 10 years ago and did not result in hospitalization.  I have updated the answers of the pertinent questions in the allergy portion.  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  This was one of the reason she delayed getting COVID-19  vaccine.  She is concerned that she gets the vaccine when she is in optimal health but I counseled her to get the vaccine as soon as she possibly can.  Her cough frequency has not worsened her weight is 1 or 2 pounds less than before but relatively stable.  She did have a few nights where she had night sweats last week but has not had much else in terms of symptoms besides what she attributes to allergies which is some congestion in her sinuses and some pain behind the ears at times.      Observations/Objective:  Sandra Burns seem to be doing well when I talked to her over the video as it was in no acute distress.    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  Radiographic findings only showed some nodular disease without cavitation.  Her symptoms also are not at the level where I would be wanting to push for her to start antimicrobial therapy.  I will plan on seeing her back in a couple of months through a video visit and we can contemplate repeat CT scan that she was asking about the day.  Follow Up Instructions:    I discussed the assessment and treatment plan with the patient. The patient was provided an opportunity to ask questions and all were answered. The patient agreed with the plan and demonstrated an understanding of the instructions.   The patient was advised to call back or seek an in-person evaluation if the symptoms worsen or if the condition fails to improve as anticipated.  I spent greater than 30 minutes with the patient including greater than 50% of time in face to face counsel of the patient re M avium and rec COVID vaccinations and in coordination of her care.

## 2020-04-03 ENCOUNTER — Ambulatory Visit (INDEPENDENT_AMBULATORY_CARE_PROVIDER_SITE_OTHER): Payer: Medicare HMO | Admitting: Psychiatry

## 2020-04-03 DIAGNOSIS — F331 Major depressive disorder, recurrent, moderate: Secondary | ICD-10-CM

## 2020-04-03 DIAGNOSIS — Z8659 Personal history of other mental and behavioral disorders: Secondary | ICD-10-CM

## 2020-04-03 DIAGNOSIS — F4323 Adjustment disorder with mixed anxiety and depressed mood: Secondary | ICD-10-CM | POA: Diagnosis not present

## 2020-04-03 DIAGNOSIS — A310 Pulmonary mycobacterial infection: Secondary | ICD-10-CM

## 2020-04-03 DIAGNOSIS — Z636 Dependent relative needing care at home: Secondary | ICD-10-CM

## 2020-04-03 DIAGNOSIS — Z630 Problems in relationship with spouse or partner: Secondary | ICD-10-CM

## 2020-04-03 DIAGNOSIS — F422 Mixed obsessional thoughts and acts: Secondary | ICD-10-CM

## 2020-04-03 NOTE — Progress Notes (Signed)
Psychotherapy Progress Note [facility] [name], PhD LP  [name]     MRN: [id number] Therapy format: Individual psychotherapy Date: 04/03/2020      Start: 2:10p     Stop: 3:00p     Time Spent: 50 min Location: Telehealth visit -- I connected with this patient by an approved telecommunication method (video), with her informed consent, and verifying identity and patient privacy.  I was located at my office and patient at her home.  As needed, we discussed the limitations, risks, and security and privacy concerns associated with telehealth service, including the availability and conditions which currently govern in-person appointments and the possibility that 3rd-party payment may not be fully guaranteed and she may be responsible for charges.  After she indicated understanding, we proceeded with the session.  Also discussed treatment planning, as needed, including ongoing verbal agreement with the plan, the opportunity to ask and answer all questions, her demonstrated understanding of instructions, and her readiness to call the office should symptoms worsen or she feels she is in a crisis state and needs more immediate and tangible assistance.   Session narrative (presenting needs, interim history, self-report of stressors and symptoms, applications of prior therapy, status changes, and interventions made in session) Thinks she broke a rib yesterday, or deeply bruised.  Awkward position in an office chair, reached over for a pencil, dropped her side onto a metal bar.  Very painful.  Suspects osteoporosis may have been enough.  Not taking it to a physician, knows she can take Tylenol and take it easy best she can.    Went for her COVID vaccination, required PA's review due to her allergies, they refused until she could get an allergist's review.  Likely protocol will be a "trial size" vaccine then to see how she reacts.  Her own medical research shows  vaccine manufacturers purposefully left out autoimmune patients b/c past research shows reduced efficacy.  Depressing to gear up for vaccine and then be put off, feels she should have been allowed to waive liability.  Trying to take a positive in being sent to an exceptionally high-rated allergist who is also board-certified in pulmonology.    Separately, hurt by talk with [name] about what he would do if she needed to be in the hospital.  The better he gets in health, the less thoughtful and more self-absorbed he seems to get.  Sees herself having a series of health problems, too, in the aftermath of having been on sustained high alert helping him.  Feeling some sour grapes now about having helped [name] the way she did and about giving a lot of time to listen supportively to her friend [name], sometimes 4-5 calls/day.  Caught up in feeling neglected, sorry for herself, partly for seeing her posting on Facebook when she's not taking a call from PT.  Trying to understand [name] as in anxiety first, but sees her being codependent with her daughter and indulging resentment with her rival in the [facility].  Discussed nonverbal signals and personal sensitivity to being back-burnered or micro-abandoned.    Addressed expectations vs. requests for [name] and Vickii Chafe -- a [employer] official and now-former friend -- and clarified.  Endorsed freedom to address frustrations with others and modeled behavioral feedback.  Resolved to hold off confronting [name], tend to her own abandonment sensitivity, give grace, and be quick to release her own demands for sensitive treatment.  Therapeutic modalities: Cognitive Behavioral Therapy, Solution-Oriented/Positive Psychology and Ego-Supportive  Mental Status/Observations:  Appearance:   Casual     Behavior:  Appropriate  Motor:  Normal  Speech/Language:   Clear and Coherent  Affect:  Appropriate  Mood:  dysthymic and more hopeful  Thought process:  normal  Thought content:    WNL   Sensory/Perceptual disturbances:    WNL  Orientation:  Fully oriented  Attention:  Good    Concentration:  Good  Memory:  WNL  Insight:    Good  Judgment:   Good  Impulse Control:  Good   Risk Assessment: Danger to Self: No Self-injurious Behavior: No Danger to Others: No Physical Aggression / Violence: No Duty to Warn: No Access to Firearms a concern: No  Assessment of progress:  progressing  Diagnosis:   ICD-10-CM   1. Moderate episode of recurrent major depressive disorder (HCC)  F33.1   2. Caregiver stress  Z63.6   3. History of posttraumatic stress disorder (PTSD)  Z86.59   4. Adjustment disorder with mixed anxiety and depressed mood  F43.23   5. 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 Relationship problem between partners  Z63.0   7. Mixed obsessional thoughts and acts  F42.2    Plan:  . Practice recognizing and releasing demands for astute, emotionally sensitive treatment from others and accepting them as they are; reframe disappointments as lonely, not sinful, and  . Other recommendations/advice as may be noted above . Continue to utilize previously learned skills ad lib . Maintain medication as prescribed and work faithfully with relevant prescriber(s) if any changes are desired or seem indicated . Call the clinic on-call service, present to ER, or call 911 if any life-threatening psychiatric crisis Return for time as available. . Already scheduled visit in this office 05/04/2020.  [name], PhD [name], PhD LP Clinical Psychologist, [facility] [address] [place], [place] [address] [phone]

## 2020-04-17 ENCOUNTER — Ambulatory Visit: Payer: Medicare HMO | Admitting: Psychiatry

## 2020-04-17 ENCOUNTER — Telehealth: Payer: Self-pay | Admitting: Psychiatry

## 2020-04-17 DIAGNOSIS — F331 Major depressive disorder, recurrent, moderate: Secondary | ICD-10-CM

## 2020-04-17 DIAGNOSIS — F422 Mixed obsessional thoughts and acts: Secondary | ICD-10-CM

## 2020-04-17 DIAGNOSIS — A310 Pulmonary mycobacterial infection: Secondary | ICD-10-CM

## 2020-04-17 DIAGNOSIS — F4323 Adjustment disorder with mixed anxiety and depressed mood: Secondary | ICD-10-CM | POA: Diagnosis not present

## 2020-04-17 DIAGNOSIS — Z636 Dependent relative needing care at home: Secondary | ICD-10-CM | POA: Diagnosis not present

## 2020-04-17 DIAGNOSIS — Z8659 Personal history of other mental and behavioral disorders: Secondary | ICD-10-CM

## 2020-04-17 NOTE — Telephone Encounter (Signed)
Sandra Burns, tout are scheduled for a virtual visit with your provider today.    Just as we do with appointments in the office, we must obtain your consent to participate.  Your consent will be active for this visit and any virtual visit you may have with one of our providers in the next 365 days.    If you have a MyChart account, I can also send a copy of this consent to you electronically.  All virtual visits are billed to your insurance company just like a traditional visit in the office.  As this is a virtual visit, video technology does not allow for your provider to perform a traditional examination.  This may limit your provider's ability to fully assess your condition.  If your provider identifies any concerns that need to be evaluated in person or the need to arrange testing such as labs, EKG, etc, we will make arrangements to do so.    Although advances in technology are sophisticated, we cannot ensure that it will always work on either your end or our end.  If the connection with a video visit is poor, we may have to switch to a telephone visit.  With either a video or telephone visit, we are not always able to ensure that we have a secure connection.   I need to obtain your verbal consent now.   Are you willing to proceed with your visit today?   Sandra Burns has provided verbal consent on 04/17/2020 for a virtual visit (video or telephone).   [name] Fries, PhD 04/17/2020  5:39 PM

## 2020-04-17 NOTE — Progress Notes (Signed)
Psychotherapy Progress Note [facility] [name], PhD LP  [name]     MRN: [id number] Therapy format: Individual psychotherapy Date: 04/17/2020      Start: 4:18p     Stop: 5:08p     Time Spent: 50 min Location: Telehealth visit -- I connected with this patient by an approved telecommunication method (video), with her informed consent, and verifying identity and patient privacy.  I was located at my office and patient at her home.  As needed, we discussed the limitations, risks, and security and privacy concerns associated with telehealth service, including the availability and conditions which currently govern in-person appointments and the possibility that 3rd-party payment may not be fully guaranteed and she may be responsible for charges.  After she indicated understanding, we proceeded with the session.  Also discussed treatment planning, as needed, including ongoing verbal agreement with the plan, the opportunity to ask and answer all questions, her demonstrated understanding of instructions, and her readiness to call the office should symptoms worsen or she feels she is in a crisis state and needs more immediate and tangible assistance.   Session narrative (presenting needs, interim history, self-report of stressors and symptoms, applications of prior therapy, status changes, and interventions made in session) Added to schedule today off waiting list, after calling in thi morning.  Really struggling lately, a bit chagrined to find out that [place] list only gets checked midday.  Rates her depression deeper of late, attributes to impressions of helplessness ("My [place] name should be [place]") and lunacy in the world (news of the unmasked, unvaccinated, COVID resurging, and irrational alarmist reactions).  And the delicacy of going forward with assessing and trying out the COVID vaccine for herself, very unsure of the process and what she can be sure  of in assessing her suitability to take the vaccine.  Finds her phobias flaring up under stress, and even wishing sometimes her bradycardia would just go on and turn into cardiac arrest.  Her [name] president is very anti-mask and tired of pandemic, is pushing for in-person meetings only, which threatens PT's ability to participate.    Kicking herself for not trying to get the vaccine early b/c her adrenaline was running then with [name]'s acute medical care needs and now that she's had autoimmune issues flare up feels like she missed her window.  Interpreted some depressive tendency as exhaustion from the alarms and irritations she keeps experiencing, including negativity and fearmongering among those on her social media and other connections.  Trying to keep it available that she is in the minority near the top of the curve when it comes to intelligence, attention, and judgment about risks, she just is, and it's lonely at the top that way.  Other stress present with how she can't be sure if, when, or whether [name] will lose his mind, or his health.  Support/empathy provided.   One of her phobias is whether the food she had brought in, or [name] brought back and might not have been fully compliant cleaning, is actually harboring disease.  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  Through resolve, has gained back 4 lbs by obtaining some high-protein Boost.  Knows her father and mother both modeled phobias -- father food-oriented a lot, and mother a distorting panic about upsetting his mental illness.  Says she learned some "nifty" habits from her parents (facetious).  Affirmed/encouraged to notice worry thoughts,  resolve to go in anyway, be willing to vocalize (or imaginally do so) something counterphobic, like "Geronimo!"    Dealing with concerns about her upcoming immunology appointment.  Lamonte Sakai been fully ready to go in and take the [place] vaccination  until other 89 office demurred, citing her autoimmune issues.  Encouraged check with immunologist ahead of the appointment whether it's worth the travel (e.g., if they planned to administer a trial dose of vaccine) or if it could be done remotely (information review only).  Endorsed her resolve to get off the fence with worry and try it, including if, despite all caution and consultation, vaccine side effects trigger fatal reaction.  Confirmed she is OK with dying if it works that way, consistent with her faith.  Therapeutic modalities: Cognitive Behavioral Therapy, Solution-Oriented/Positive Psychology and Faith-sensitive  Mental Status/Observations:  Appearance:   Casual     Behavior:  Appropriate  Motor:  Normal  Speech/Language:   Clear and Coherent  Affect:  Appropriate  Mood:  anxious and depressed  Thought process:  normal  Thought content:    WNL and worry  Sensory/Perceptual disturbances:    WNL  Orientation:  Fully oriented  Attention:  Good    Concentration:  Good  Memory:  WNL  Insight:    Good  Judgment:   Good  Impulse Control:  Good   Risk Assessment: Danger to Self: No Self-injurious Behavior: No Danger to Others: No Physical Aggression / Violence: No Duty to Warn: No Access to Firearms a concern: No  Assessment of progress:  situational setback(s)  Diagnosis:   ICD-10-CM   1. Moderate episode of recurrent major depressive disorder (HCC)  F33.1   2. History of posttraumatic stress disorder (PTSD)  Z86.59   3. Caregiver stress  Z63.6   4. Adjustment disorder with mixed anxiety and depressed mood  F43.23   5. Mycobacterium avium complex ([place])  A31.0   6. Mixed obsessional thoughts and acts  F42.2    Plan:  . Endorse plan to restore weight . Re. OCD, practice approaching food anyway . Re. vaccination, approach relevant questions with immunology then go on and vaccinate if no contraindicated . Per her faith, imaginally take God/Jesus with her into  anxious situations . Other recommendations/advice as may be noted above . Continue to utilize previously learned skills ad lib . Maintain medication as prescribed and work faithfully with relevant prescriber(s) if any changes are desired or seem indicated . Call the clinic on-call service, present to ER, or call 911 if any life-threatening psychiatric crisis Return for time as available, will call. . Already scheduled visit in this office 05/04/2020.  [name], PhD [name], PhD LP Clinical Psychologist, [facility] [address] [place], [place] [phone]

## 2020-05-04 ENCOUNTER — Encounter: Payer: Self-pay | Admitting: Physician Assistant

## 2020-05-04 ENCOUNTER — Telehealth (INDEPENDENT_AMBULATORY_CARE_PROVIDER_SITE_OTHER): Payer: Medicare HMO | Admitting: Physician Assistant

## 2020-05-04 DIAGNOSIS — F4310 Post-traumatic stress disorder, unspecified: Secondary | ICD-10-CM

## 2020-05-04 DIAGNOSIS — F329 Major depressive disorder, single episode, unspecified: Secondary | ICD-10-CM | POA: Diagnosis not present

## 2020-05-04 DIAGNOSIS — F411 Generalized anxiety disorder: Secondary | ICD-10-CM | POA: Diagnosis not present

## 2020-05-04 MED ORDER — ALPRAZOLAM 0.5 MG PO TABS: ORAL_TABLET | ORAL | 5 refills | Status: DC

## 2020-05-04 NOTE — Progress Notes (Signed)
[facility]  [name],  MRN: [id number]  PCP: Tracey Harries, FNP  Date of Evaluation: 05/04/2020 Time spent:20 minutes  Chief Complaint:  Chief Complaint    Follow-up     Virtual Visit via Telehealth  I connected with patient by a video enabled telemedicine application with their informed consent, and verified patient privacy and that I am speaking with the correct person using two identifiers.  I am private, in my office and the patient is at home.  I discussed the limitations, risks, security and privacy concerns of performing an evaluation and management service by video and the availability of in person appointments. I also discussed with the patient that there may be a patient responsible charge related to this service. The patient expressed understanding and agreed to proceed.   I discussed the assessment and treatment plan with the patient. The patient was provided an opportunity to ask questions and all were answered. The patient agreed with the plan and demonstrated an understanding of the instructions.   The patient was advised to call back or seek an in-person evaluation if the symptoms worsen or if the condition fails to improve as anticipated.  I provided 20 minutes of non-face-to-face time during this encounter.  HISTORY/CURRENT STATUS: For routine med check.  Has had some depression since our last visit, because she's unable to get the covid vaccine. She has several autoimmune disorders, plus she's had anaphylaxis in the past, so the drs don't want her to have it.  And the amount of immunity would be low anyway.  "I really kind of knew that, but I had some hope that I could take it and it would help prevent getting covid."  She is still dealing with her husband's chronic illness.  That is difficult too.  Between that and the sadness over not being able to get the COVID shot, she has had a rough month or so.  But she does crafts and  works on genealogy and those things help her get out of the sadness.  States that sometimes she feels like an antidepressant would be helpful.  However she is tried many and is intolerant of so many medication she is not sure what would be appropriate.  Energy and motivation are good right now.  She is isolating mostly due to the COVID pandemic.  No suicidal or homicidal thoughts.  Anxiety is well controlled.  She needs the Xanax around the clock and if she does not have it, she becomes very anxious.  At some point, she would like to wean down but states this is not really the time.  Patient denies increased energy with decreased need for sleep, no increased talkativeness, no racing thoughts, no impulsivity or risky behaviors, no increased spending, no increased libido, no grandiosity.  Denies dizziness, syncope, seizures, numbness, tingling, tremor, tics, unsteady gait, slurred speech, confusion. Denies muscle or joint pain, stiffness, or dystonia.  Individual Medical History/ Review of Systems: Changes? :No    Past medications for mental health diagnoses include: Zoloft, caused psychosis w/ hallucinations,  BuSpar, Ativan, Effexor (became psychotic) She was told to never take any kind of SSRI/SNRI again.  Allergies: Albuterol, Cephalosporins, Flagyl [metronidazole], Macrobid [nitrofurantoin macrocrystal], Sulfa antibiotics, Vancomycin, Vicodin [hydrocodone-acetaminophen], Amoxicillin, Ciprofloxacin, Bacitracin-neomycin-polymyxin, Tobramycin, Decongest-aid [pseudoephedrine], Levofloxacin, Prednisone, and Zithromax [azithromycin]  Current Medications:  Current Outpatient Medications:  .  ALPRAZolam (XANAX) 0.5 MG tablet, TAKE 1 & 1/2 TABS IN AM, 1 & 1/2 TABS AT LUNCH, 1 TAB AT  DINNER, 1 & 1/2 TABS AT BEDTIME AS NEEDED, Disp: 165 tablet, Rfl: 5 .  Artificial Tear Ointment (REFRESH P.M. OP), Place 1 application into both eyes at bedtime. Apply eye gel to both eyelids, Disp: , Rfl:  .  aspirin 81 MG  chewable tablet, Chew 81 mg by mouth daily., Disp: , Rfl:  .  atenolol (TENORMIN) 25 MG tablet, Take 12.5 mg by mouth 2 (two) times daily. , Disp: , Rfl:  .  budesonide (PULMICORT) 180 MCG/ACT inhaler, Inhale 2 puffs into the lungs 2 (two) times daily. , Disp: , Rfl:  .  dicyclomine (BENTYL) 20 MG tablet, Take 1 tablet (20 mg total) by mouth 2 (two) times daily., Disp: 20 tablet, Rfl: 0 .  fluticasone (FLONASE) 50 MCG/ACT nasal spray, Place 1 spray into both nostrils daily as needed for allergies., Disp: , Rfl: 6 .  ipratropium (ATROVENT HFA) 17 MCG/ACT inhaler, Inhale 2 puffs into the lungs 4 (four) times daily., Disp: , Rfl:  .  Multiple Vitamin (MULTIVITAMIN WITH MINERALS) TABS, Take 1 tablet by mouth daily., Disp: , Rfl:  .  mupirocin ointment (BACTROBAN) 2 %, APPLY TO AFFECTED AREA 3 TIMES A DAY, Disp: , Rfl:  .  pantoprazole (PROTONIX) 40 MG tablet, Take 40 mg by mouth daily as needed (GERD). For stomach , Disp: , Rfl:  .  Polyvinyl Alcohol-Povidone (REFRESH OP), Place 1 drop into both eyes daily as needed (dry eyes)., Disp: , Rfl:  .  ZIRGAN 0.15 % GEL, APPLY 1 A SMALL AMOUNT IN AFFECTED EYE 5 TIMES A DAY UNTIL DIRECTED, Disp: , Rfl:  .  dicyclomine (BENTYL) 10 MG capsule, TAKE 1 CAPSULE (10 MG TOTAL) BY MOUTH ONCE DAILY AS NEEDED (Patient not taking: Reported on 05/04/2020), Disp: , Rfl:  .  doxycycline (VIBRA-TABS) 100 MG tablet, , Disp: , Rfl:  .  erythromycin ophthalmic ointment, APPLY A SMALL AMOUNT TO AFFECTED EYE 3X/DAY (Patient not taking: Reported on 05/04/2020), Disp: , Rfl:  Medication Side Effects: none  Family Medical/ Social History: Changes? No  MENTAL HEALTH EXAM:  There were no vitals taken for this visit.There is no height or weight on file to calculate BMI.  General Appearance: Casual, Neat and Well Groomed  Eye Contact:  Good  Speech:  Clear and Coherent and Normal Rate  Volume:  Normal  Mood:  Euthymic  Affect:  Appropriate  Thought Process:  Goal Directed and  Descriptions of Associations: Intact  Orientation:  Full (Time, Place, and Person)  Thought Content: Logical   Suicidal Thoughts:  No  Homicidal Thoughts:  No  Memory:  WNL  Judgement:  Good  Insight:  Good  Psychomotor Activity:  Normal  Concentration:  Concentration: Good  Recall:  Good  Fund of Knowledge: Good  Language: Good  Assets:  Desire for Improvement  ADL's:  Intact  Cognition: WNL  Prognosis:  Good    DIAGNOSES:    ICD-10-CM   1. Generalized anxiety disorder  F41.1   2. PTSD (post-traumatic stress disorder)  F43.10   3. 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 [name]   RECOMMENDATIONS:  PDMP reviewed. I provided 20 minutes of non-face to face time during this encounter. Discussed Elavil.  Neither of [place] think she has ever tried a tricyclic so that may be a good option for her if needed.  We discussed the benefits and risks as well as the side effects so if she feels more depressed and she does  not improve with counseling, her crafts and genealogy, then she can call and we will start the Elavil over the phone.  Because of her extreme sensitivities, I would start at 10 mg and increase as tolerated. Continue Xanax 0.5 mg, 1.5 pills every morning, 1.5 pill at lunch, 1 pill at dinner, 1.5 at bedtime as needed. Continue psychotherapy with Dr. [name].  Return in 6 months.  Melony Overly, PA-C

## 2020-05-08 ENCOUNTER — Ambulatory Visit (INDEPENDENT_AMBULATORY_CARE_PROVIDER_SITE_OTHER): Payer: Medicare HMO | Admitting: Psychiatry

## 2020-05-08 DIAGNOSIS — F4310 Post-traumatic stress disorder, unspecified: Secondary | ICD-10-CM

## 2020-05-08 DIAGNOSIS — Z8659 Personal history of other mental and behavioral disorders: Secondary | ICD-10-CM

## 2020-05-08 DIAGNOSIS — F411 Generalized anxiety disorder: Secondary | ICD-10-CM

## 2020-05-08 DIAGNOSIS — F422 Mixed obsessional thoughts and acts: Secondary | ICD-10-CM

## 2020-05-08 DIAGNOSIS — A310 Pulmonary mycobacterial infection: Secondary | ICD-10-CM | POA: Diagnosis not present

## 2020-05-08 DIAGNOSIS — Z636 Dependent relative needing care at home: Secondary | ICD-10-CM

## 2020-05-08 DIAGNOSIS — L4050 Arthropathic psoriasis, unspecified: Secondary | ICD-10-CM | POA: Diagnosis not present

## 2020-05-08 DIAGNOSIS — Z889 Allergy status to unspecified drugs, medicaments and biological substances status: Secondary | ICD-10-CM

## 2020-05-08 DIAGNOSIS — F331 Major depressive disorder, recurrent, moderate: Secondary | ICD-10-CM
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
## 2020-05-20 ENCOUNTER — Telehealth: Payer: Medicare HMO | Admitting: Infectious Disease

## 2020-05-25 ENCOUNTER — Ambulatory Visit (INDEPENDENT_AMBULATORY_CARE_PROVIDER_SITE_OTHER): Payer: Medicare HMO | Admitting: Psychiatry

## 2020-05-25 DIAGNOSIS — A310 Pulmonary mycobacterial infection: Secondary | ICD-10-CM

## 2020-05-25 DIAGNOSIS — Z8659 Personal history of other mental and behavioral disorders: Secondary | ICD-10-CM

## 2020-05-25 DIAGNOSIS — F331 Major depressive disorder, recurrent, moderate: Secondary | ICD-10-CM

## 2020-05-25 DIAGNOSIS — F411 Generalized anxiety disorder: Secondary | ICD-10-CM | POA: Diagnosis not present

## 2020-05-25 DIAGNOSIS — Z636 Dependent relative needing care at home: Secondary | ICD-10-CM

## 2020-05-25 NOTE — Progress Notes (Signed)
Psychotherapy Progress Note [facility] [name], PhD LP  [name]     MRN: [id number] Therapy format: Individual psychotherapy Date: 05/25/2020      Start: 9:19a     Stop: 10:09a     Time Spent: 50 min Location: Telehealth visit -- I connected with this patient by an approved telecommunication method (video), with her informed consent, and verifying identity and patient privacy.  I was located at my office and patient at her home.  As needed, we discussed the limitations, risks, and security and privacy concerns associated with telehealth service, including the availability and conditions which currently govern in-person appointments and the possibility that 3rd-party payment may not be fully guaranteed and she may be responsible for charges.  After she indicated understanding, we proceeded with the session.  Also discussed treatment planning, as needed, including ongoing verbal agreement with the plan, the opportunity to ask and answer all questions, her demonstrated understanding of instructions, and her readiness to call the office should symptoms worsen or she feels she is in a crisis state and needs more immediate and tangible assistance.   Still having problem of browser showing [place] in severe closeup, able to improve with her own zoom setting.  Session narrative (presenting needs, interim history, self-report of stressors and symptoms, applications of prior therapy, status changes, and interventions made in session) Weary, tired, difficult week.  Did figure out better that she has been practicing depression by sleeping off and on during the day.  One stress from being confidante to a friend of 10 years who is a generation younger Estill Bamberg), lost her mother.  A lot of it by text.  Trying to bring herself up with crafts (3D butterflies), listening to pleasant music.  Very validating for her friend, as opposed to the pat answer others give.  Evocative  story of Amanda's mother found to be neglecting medication since March, mirroring PT's own story of her mother continuing to take med that caused intestinal bleeding and knowingly hastened her own death.  Another friend [place] in [place] with complicated case of COVID and flu, passed away after seeming to begin recovering.  Sleeping a lot, partly for sadness and empathic depression, partly for seeing [name] have a bad week for comprehension, memory, and judgment.  He's changing things with his insulin without consulting, and is misjudging foods re. his diabetes, which seems to be driving it.  Some things overdone, like over-sanitizing, some under-, like forgetting to wash his hands first.  Another friend Butch Penny repeatedly tactless, but knows she means well.  Recognizes a lot of stress for having to follow behind [place], and the night before grocery day can be fitful sleepless.  Describes errors he's made shopping, sanitizing, and judgment about his blood sugar.  Also noted last week was 3 years since she last saw her granddaughter (due to son and DIL blocking her).  Will still hurt, any time she notices.  Also noticed in session how she blocked this out momentarily and skirted the issue, interjected it in the course of another subject -- affirmed bringing it out as an act of "non-avoidance", ultimately healthier, and allowed to change subjects.  Other stress with friend [name] (fellow patient), and how preoccupied she is, not mindful of when Pt is not up to it and not that available as a friend to Pt right now.  Writing poetry, grieving changes with her friend in veiled but poignant terms.  Has been able to state  how she misses her friend, and to redirect her a bit when she starts monologuing about her pains and betrayals.  Discussed PT's apprehensions and challenged avoidant assumptions re asking for change in their interactions.  Encouraged that any friend worthwhile can handle being asked to take turns, or even  lightly challenged about repetition and monologuing, e.g., "Sounds like you feel a need to tell the story again.  What do you want to have happen?"  Uncomfortable, but will consider empathically put, "process" questions like that to try to redirect.  Affirmed as kindness to both of them if he can, and even if Pt just says she wants to stop talking about the [facility], it's honest, and it's actually OK.  Memory 4-5yo of going in the chicken house, finding a mouse nest, bringing back handful of baby mice, and mother making her flush them down the toilet.  Another memory of picking ragweed, mother.  Advised she address [name] about self-care for diabetes, since it's clear how important his mental state and judgment are.  Will be important to clarify whether his currently diminished capacity is reversible with proper diabetes care or progressive and closer to dementia.  Encouraged in it being OK to say what she wants/needs and is comfortable doing or letting be done by him, and acknowledged it was easier in a way when he was convalescing and clearly not capable last year.  Therapeutic modalities: Cognitive Behavioral Therapy, Solution-Oriented/Positive Psychology and Assertiveness/Communication  Mental Status/Observations:  Appearance:   Casual     Behavior:  Appropriate  Motor:  Normal  Speech/Language:   Clear and Coherent  Affect:  wearied  Mood:  dysthymic  Thought process:  normal  Thought content:    WNL  Sensory/Perceptual disturbances:    WNL  Orientation:  Fully oriented  Attention:  Fair    Concentration:  Good  Memory:  WNL  Insight:    Good  Judgment:   Good  Impulse Control:  Good   Risk Assessment: Danger to Self: No Self-injurious Behavior: No Danger to Others: No Physical Aggression / Violence: No Duty to Warn: No Access to Firearms a concern: No  Assessment of progress:  stabilized  Diagnosis:   ICD-10-CM   1. Moderate episode of recurrent major depressive disorder (HCC)   F33.1   2. Generalized anxiety disorder  F41.1   3. 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 History of posttraumatic stress disorder (PTSD)  Z86.59   5. Caregiver stress  Z63.6    Plan:  . Consider willingness and tactics asking for change in interactions with friend . Consider willingness and tactics addressing husband's mismanagement of diabetes . Other recommendations/advice as may be noted above . Continue to utilize previously learned skills ad lib . Maintain medication as prescribed and work faithfully with relevant prescriber(s) if any changes are desired or seem indicated . Call the clinic on-call service, present to ER, or call 911 if any life-threatening psychiatric crisis Return in about 2 weeks (around 06/08/2020) for time as available. . Already scheduled visit in this office 06/05/2020.  [name], PhD [name], PhD LP Clinical Psychologist, [facility] [address], [place], [place] [address] [phone]

## 2020-06-05 ENCOUNTER — Ambulatory Visit (INDEPENDENT_AMBULATORY_CARE_PROVIDER_SITE_OTHER): Payer: Medicare HMO | Admitting: Psychiatry

## 2020-06-05 DIAGNOSIS — Z8659 Personal history of other mental and behavioral disorders: Secondary | ICD-10-CM

## 2020-06-05 DIAGNOSIS — F331 Major depressive disorder, recurrent, moderate: Secondary | ICD-10-CM | POA: Diagnosis not present

## 2020-06-05 DIAGNOSIS — M549 Dorsalgia, unspecified: Secondary | ICD-10-CM

## 2020-06-05 DIAGNOSIS — Z636 Dependent relative needing care at home: Secondary | ICD-10-CM

## 2020-06-05 DIAGNOSIS — M5489 Other dorsalgia: Secondary | ICD-10-CM

## 2020-06-05 DIAGNOSIS — F411 Generalized anxiety disorder: Secondary | ICD-10-CM

## 2020-06-05 DIAGNOSIS — Z630 Problems in relationship with spouse or partner: Secondary | ICD-10-CM

## 2020-06-05 DIAGNOSIS — G8929 Other chronic pain: Secondary | ICD-10-CM

## 2020-06-05 DIAGNOSIS — I499 Cardiac arrhythmia, unspecified: Secondary | ICD-10-CM

## 2020-06-05 DIAGNOSIS — A310 Pulmonary mycobacterial infection: Secondary | ICD-10-CM

## 2020-06-05 NOTE — Progress Notes (Signed)
Psychotherapy Progress Note [facility] [name], PhD LP  [name]     MRN: [id number] Therapy format: Individual psychotherapy Date: 06/05/2020      Start: 4:10p     Stop: 5:15p     Time Spent: 45 min (remainder donated) Location: Telehealth visit -- I connected with this patient by an approved telecommunication method (video), with her informed consent, and verifying identity and patient privacy.  I was located at my office and patient at her home.  As needed, we discussed the limitations, risks, and security and privacy concerns associated with telehealth service, including the availability and conditions which currently govern in-person appointments and the possibility that 3rd-party payment may not be fully guaranteed and she may be responsible for charges.  After she indicated understanding, we proceeded with the session.  Also discussed treatment planning, as needed, including ongoing verbal agreement with the plan, the opportunity to ask and answer all questions, her demonstrated understanding of instructions, and her readiness to call the office should symptoms worsen or she feels she is in a crisis state and needs more immediate and tangible assistance.   Session narrative (presenting needs, interim history, self-report of stressors and symptoms, applications of prior therapy, status changes, and interventions made in session) Very challenging couple of weeks with [name]' cognitive decline, mainly, and more onslaught of bad news about acquaintances falling ill and dying of COVID, especially friend [place].  Can feel urgent need to get away from [place], but also feels stymied about needing a couple things fixed in the house but not being able to trust letting repairmen in, plus ordering anything online still turns to dread of handling the packaging.  Finding out [name] is scrambling messages, including her disinfecting procedures for groceries,  including repackaging, handwashing, and quarantining 3 days.  As a diabetic, he gets captivated by food that is visible but not available, and recently went into the quarantine crisper to get cheese sticks prematurely.  Led to irritable moment addressing him about his mistakes, getting upset, then feeling guilty for getting angry.  What he could say for himself is that maybe he just heard what he wanted in the instructions and he indulged.  Plus the dreadful realization that [name] is more impulsive and peevish, as a diabetic, and less able to edit himself, with suspected brain damage.  He seems to be escalating his inability to wait for snacks he wants, and it is fundamentally lonely to see his mind fading, plus it genuinely requires notes and maybe other measures to deter actions which conceivably threaten her life, given her multiple serious health conditions.  And [name] has been messing with his medicine lately, including insulin.  And before COVID he was showing more peculiar behavior, like separate shopping carts and irrational hangup on 50% expenses, albeit with history of being guarded about money due to his first marriage.  Believes she's seen dementia trying to come on since 2018 at least, and the primary care clock drawing test he passed seems like a joke compared to his actual symptoms.  Other than that, tired of the world, tired of being shut in, tired of having to think about precautions.  Support/empathy provided.   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  Has a very specific advance directive, which prohibits many procedures.  Knows she would be ready to die if it comes to it, had recent conversations  with friend's daughter, a nurse who works with [place], that her wishes would be respected.  Decidedly not suicidal, just very weary, and concerned with her frayed patience.  Affirmed and encouraged.  For [name]'s  impulsive choices, agreed he is more prone for being diabetic, already had childlike characteristics showing, and repeat anesthesia and likely brain damage from coma, prolonged pancreatic insult, insulin dependence, and aging probably are approaching a level of dementia.  Encouraged in [name]'s own interest in seeing a neurologist.  Priority, most likely, on regaining better control of self-care for diabetes.  Recommended engage him constructively, shy away from asking why he forgets or reacts and emphasize asking him how or what he would like to address.    Re. her own irritable reactions, recommend advance the practice of "do-overs" with him , if he is wiling, e.g., re-enact the refrigerator moment going into her cheese sticks.  Depends on how clear his emotionally loaded memory is how much goodwill it can work, but with or without him, it makes a better memory and a stronger habit for her in how she reacts under stress.  Therapeutic modalities: Cognitive Behavioral Therapy, Solution-Oriented/Positive Psychology and Assertiveness/Communication  Mental Status/Observations:  Appearance:   Casual     Behavior:  Appropriate  Motor:  Normal  Speech/Language:   Clear and Coherent  Affect:  Appropriate  Mood:  dysthymic  Thought process:  normal  Thought content:    WNL  Sensory/Perceptual disturbances:    WNL  Orientation:  Fully oriented  Attention:  Fair    Concentration:  Good  Memory:  WNL  Insight:    Good  Judgment:   Good  Impulse Control:  Good   Risk Assessment: Danger to Self: No Self-injurious Behavior: No Danger to Others: No Physical Aggression / Violence: No Duty to Warn: No Access to Firearms a concern: No  Assessment of progress:  stabilized  Diagnosis:   ICD-10-CM   1. Generalized anxiety disorder  F41.1   2. Moderate episode of recurrent major depressive disorder (HCC)  F33.1   3. History of posttraumatic stress disorder (PTSD)  Z86.59   4. Caregiver stress  Z63.6    5. 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 Cardiac arrhythmia, unspecified cardiac arrhythmia type  I49.9   7. Other chronic back pain  M54.9    G89.29   8. Relationship problem between partners  Z63.0    Plan:  . Introduce do-overs with [name] re irritable moments . Engage [name] more collaboratively about what measures may work to keep him from going into quarantine foods impulsively.  (Option to discuss humorous/ridiculous ideas like duct taping the drawer closed, rigging up electric shock, posting tiny alligators in the fridge...) . Other recommendations/advice as may be noted above . Continue to utilize previously learned skills ad lib . Maintain medication as prescribed and work faithfully with relevant prescriber(s) if any changes are desired or seem indicated . Call the clinic on-call service, present to ER, or call 911 if any life-threatening psychiatric crisis Return for time as available, will call, needs teletherapy. . Already scheduled visit in this office 11/02/2020.  [name], PhD [name], PhD LP Clinical Psychologist, [facility] [address] [place], [place] [address] [phone]

## 2020-06-09 ENCOUNTER — Other Ambulatory Visit: Payer: Self-pay

## 2020-06-09 ENCOUNTER — Encounter: Payer: Self-pay | Admitting: Infectious Disease

## 2020-06-09 ENCOUNTER — Telehealth (INDEPENDENT_AMBULATORY_CARE_PROVIDER_SITE_OTHER): Payer: Medicare HMO | Admitting: Infectious Disease

## 2020-06-09 DIAGNOSIS — I341 Nonrheumatic mitral (valve) prolapse: Secondary | ICD-10-CM

## 2020-06-09 DIAGNOSIS — A310 Pulmonary mycobacterial infection: Secondary | ICD-10-CM | POA: Diagnosis not present

## 2020-06-09 DIAGNOSIS — I369 Nonrheumatic tricuspid valve disorder, unspecified: Secondary | ICD-10-CM | POA: Diagnosis not present

## 2020-06-09 DIAGNOSIS — F411 Generalized anxiety disorder: Secondary | ICD-10-CM | POA: Diagnosis not present

## 2020-06-09 DIAGNOSIS — J471 Bronchiectasis with (acute) exacerbation: Secondary | ICD-10-CM

## 2020-06-09 DIAGNOSIS — Q228 Other congenital malformations of tricuspid valve: Secondary | ICD-10-CM

## 2020-06-09 NOTE — Progress Notes (Addendum)
Virtual Visit via Phone Note  I connected with Sandra Burns on 06/09/20 at 11:00 AM EDT by a telephone  and verified that I am speaking with the correct person using two identifiers.  Location: Patient: Home Provider: [facility]   I discussed the limitations of evaluation and management by telemedicine and the availability of in person appointments. The patient expressed understanding and agreed to proceed.  History of Present Illness:   Sandra Burns is a 64 year-old Caucasian female with a past medical history for asthma, COPD, apparent mitral valve prolapse, atrial fibrillation who had developed dyspnea and ultimately underwent evaluation in the emergency department in December of this past year.  Her d-dimer was slightly positive and she underwent CT angiogram.  It was negative for pulmonary embolism but did show a Mild tree-in-bud nodularity in the right lower lobe.   She has been evaluated by pulmonary Sandra Burns who reviewed her films and found that she might have very mild bronchiectatic changes confined to the left and right upper lobe.  Patient has undergone repeat imaging in Sandra Burns which again showed some nodularity.  The question of whether she might have Mycobacterium avium was raised  Interim the patient who had not been coughing has been doing all sorts of maneuvers to try to get herself to cough up sputum including sleeping in a recliner and doing other maneuvers to get sputum up such as using a flutter valve.  She was able to get 3 sputum's for submission and 2 of the 3 did not grow any organisms the third grew Mycobacterium avium complex.  She didnot seem to cough at all except if she is trying to get sputum up at which she does cheat chiefly at night.  She is without fevers she is without weight loss.  She did however have some dyspnea on exertion that is been going on for several months.  In talking to her during my EVISITS  I was not at all  convinced that she has clear-cut active Mycobacterium avium infection.  Certainly she did snot meet ATS guidelines definition for such infection.  I have discussed with her that even if she did have evidence of Mycobacterium infection her symptom complex to me would not be wanting which I would be pushing hard for her to be on therapy.  After careful discussion I felt it prudent to not initiate therapy but to revisit how she is doing in roughly 3 to 4 months time.  We did contemplate repeating the CT scan 6 months after the most recent one which was done in January.  09/04/2019: In talking today she says that  her lungs are "doing well."   She says she is doing a technique of graduated breathing she learned from support group, deep breath, blow out long and after 3-4, then breath in and blow out all the way. It helps apparently move sputum from lower int he lungs up and allows her to get sputum out.  Sputum typically clear sputum  No recent low grade fevers  She lost weight but attributes this to stress related to her husband's four hospitalizations related to idiopathic necrotizing pancreatitis  She has eaten much much less.  She clarified that her reaction to azithromycin was that she had loose stools and was diagnosed with C. difficile colitis.  Her allergies ciprofloxacin was 1 in which she had swelling in the arm where the infusion was taking place along with a rash.  She does state that she had mucous membrane  involvement when given metronidazole for C. difficile colitis.  Amoxicillin was an allergy that caused itching and apparently some swelling but this happened more than 10 years ago and did not result in hospitalization.  I have updated the answers of the pertinent questions in the allergy portion.   Since I last talked to her cough is stable.  She lost a few pounds of weight which she attributes to struggling with depression and anxiety.  Her husband has had  several surgeries for necrotizing pancreatitis and seems to have now had a declaration of dementia.  He continues to live at home with Sandra Burns.  Sandra Burns still not received her COVID-19 vaccines because she says that providers been reluctant to give it to her because of her history of allergies.        Observations/Objective:  Sandra Burns seem to be doing well when I talked to her over the phone   Assessment and Plan:  Mycobacterium avium infection: She is still is not having sufficient symptoms in my view to warrant initiating antibacterial therapy for M avium  COVID prevention: We will have her come in for vaccine this Friday  We will see her back in roughly 6 months time.  Follow Up Instructions:    I discussed the assessment and treatment plan with the patient. The patient was provided an opportunity to ask questions and all were answered. The patient agreed with the plan and demonstrated an understanding of the instructions.   The patient was advised to call back or seek an in-person evaluation if the symptoms worsen or if the condition fails to improve as anticipated.   I spent 22 minutes in counseling the patient over the phone.

## 2020-06-12 ENCOUNTER — Ambulatory Visit: Payer: Medicare HMO

## 2020-06-19 ENCOUNTER — Ambulatory Visit: Payer: Medicare HMO

## 2020-06-26 ENCOUNTER — Ambulatory Visit (INDEPENDENT_AMBULATORY_CARE_PROVIDER_SITE_OTHER): Payer: Medicare HMO

## 2020-06-26 ENCOUNTER — Other Ambulatory Visit: Payer: Self-pay

## 2020-06-26 DIAGNOSIS — Z23 Encounter for immunization: Secondary | ICD-10-CM

## 2020-06-26 NOTE — Progress Notes (Signed)
   Covid-19 Vaccination Clinic  Name:  Sandra Burns    MRN: [id number] DOB: [birth year]  06/26/2020  Sandra Burns was observed post Covid-19 immunization for 30 minutes based on pre-vaccination screening without incident. She was provided with Vaccine Information Sheet and instruction to access the V-Safe system.   Sandra Burns was instructed to call 911 with any severe reactions post vaccine: Marland Kitchen Difficulty breathing  . Swelling of face and throat  . A fast heartbeat  . A bad rash all over body  . Dizziness and weakness   Immunizations Administered    Name Date Dose VIS Date Route   Pfizer COVID-19 Vaccine 06/26/2020  9:41 AM 0.3 mL 11/20/2018 Intramuscular   Manufacturer: [employer], [employer]   Lot: [id number]   NDC: [address]-9     [name], RN

## 2020-07-17 ENCOUNTER — Ambulatory Visit: Payer: Medicare HMO

## 2020-07-22 ENCOUNTER — Ambulatory Visit: Payer: Medicare HMO | Admitting: Psychiatry

## 2020-07-22 DIAGNOSIS — Z636 Dependent relative needing care at home: Secondary | ICD-10-CM | POA: Diagnosis not present

## 2020-07-22 DIAGNOSIS — M549 Dorsalgia, unspecified: Secondary | ICD-10-CM

## 2020-07-22 DIAGNOSIS — M5489 Other dorsalgia: Secondary | ICD-10-CM

## 2020-07-22 DIAGNOSIS — G8929 Other chronic pain: Secondary | ICD-10-CM

## 2020-07-22 DIAGNOSIS — A310 Pulmonary mycobacterial infection: Secondary | ICD-10-CM

## 2020-07-22 DIAGNOSIS — F331 Major depressive disorder, recurrent, moderate: Secondary | ICD-10-CM

## 2020-07-22 DIAGNOSIS — Z8659 Personal history of other mental and behavioral disorders: Secondary | ICD-10-CM

## 2020-07-22 NOTE — Progress Notes (Signed)
Psychotherapy Progress Note [facility] [name], PhD LP  [name]     MRN: [id number] Therapy format: Individual psychotherapy Date: 07/22/2020      Start: 1:08p     Stop: 2:12p     Time Spent: 45 min (remainder donated) Location: Telehealth visit -- I connected with this patient by an approved telecommunication method (video), with her informed consent, and verifying identity and patient privacy.  I was located at my office and patient at her home.  As needed, we discussed the limitations, risks, and security and privacy concerns associated with telehealth service, including the availability and conditions which currently govern in-person appointments and the possibility that 3rd-party payment may not be fully guaranteed and she may be responsible for charges.  After she indicated understanding, we proceeded with the session.  Also discussed treatment planning, as needed, including ongoing verbal agreement with the plan, the opportunity to ask and answer all questions, her demonstrated understanding of instructions, and her readiness to call the office should symptoms worsen or she feels she is in a crisis state and needs more immediate and tangible assistance.   Session narrative (presenting needs, interim history, self-report of stressors and symptoms, applications of prior therapy, status changes, and interventions made in session) Sinus infection and/or allergy-laden sinuses, began antibiotics today.  Had COVID #1 Oct 1 but has had to postpone #2.  Frustrating, irritable for all the stop-and-go she's had to feel about getting her vaccine.  Already plans to have an antibody test to see if it was effective.  Fighting some demoralization about not being able to stay well.  Typically feels horrible in ragweed season anyway.    Went through another "horrifying" (frustrated) period with friend [name], when she didn't reply to an anxiety-laden text from  [place].  Felt like friend was pulling away.  Eventually had to check to see if their relationship was OK, had to self-restrain to keep from chewing her out.  Has a longterm concern that [name] disappears when it starts to get too close.  Realizing she just has to not expect [name] to be so magnanimous, that really, she was in closer touch when she needed much herself.  Had to work through [employer], kept to herself, talk herself down from thoughts of [name] owing her for the time she put in supporting her.  A lot of thinking about reasons [name] might be affected by other things and trying to temper her own expectations, tamp down her own making it too important that [name] be thoughtful the way she herself would be.    Old friend Butch Penny keeps putting her foot in it with self-referential comments in response to things Sammi needs to share, apparently very bad at reading the room and empathy, though Lakiya admittedly brings an uncommon sensitivity to it and seems to struggle with the instinct to expect others to be as astute as she.  Turns out their friendship is less a friendship at this point, more one-sided complaint and listening, at least in practice.  Reviewed interactions and encouraged in the freedom and blessing that it's OK to teach her friend, not just decide whether to give time or not.    Has found herself chewing out [name] lately, feels like she's not being fair, just out of "filter" the way she's feeling.  Encouraged in staying open and interactive with him.  While Cherylynn is articulate and compelling in the issues she discusses, once again losing track of  time and courting extended listening.  Supportively confronted running on.   Seems to have lost further weight. Therapeutic modalities: Cognitive Behavioral Therapy, Solution-Oriented/Positive Psychology and Ego-Supportive  Mental Status/Observations:  Appearance:   Casual     Behavior:  Appropriate  Motor:  Normal   Speech/Language:   Clear and Coherent  Affect:  Appropriate  Mood:  dysthymic  Thought process:  normal  Thought content:    WNL  Sensory/Perceptual disturbances:    WNL  Orientation:  Fully oriented  Attention:  Fair    Concentration:  Good  Memory:  WNL  Insight:    Good  Judgment:   Good  Impulse Control:  Good   Risk Assessment: Danger to Self: No Self-injurious Behavior: No Danger to Others: No Physical Aggression / Violence: No Duty to Warn: No Access to Firearms a concern: No  Assessment of progress:  stabilized  Diagnosis:   ICD-10-CM   1. Moderate episode of recurrent major depressive disorder (HCC)  F33.1   2. 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 Caregiver stress  Z63.6   4. Other chronic back pain  M54.9    G89.29   5. History of posttraumatic stress disorder (PTSD)  Z86.59    Plan:  . Bear with allergy season best able, self-remind that it's temporary part of malaise . Consider constructive confrontations with Butch Penny and [place] . Maintain adequate calories  . Other recommendations/advice as may be noted above . Continue to utilize previously learned skills ad lib . Maintain medication as prescribed and work faithfully with relevant prescriber(s) if any changes are desired or seem indicated . Call the clinic on-call service, present to ER, or call 911 if any life-threatening psychiatric crisis Return for time as available, will call. . Already scheduled visit in this office 11/02/2020.  [name], PhD [name], PhD LP Clinical Psychologist, [facility] [address], [place], [place] [address] [phone]

## 2020-07-24 ENCOUNTER — Ambulatory Visit: Payer: Medicare HMO

## 2020-07-31 ENCOUNTER — Other Ambulatory Visit: Payer: Self-pay

## 2020-07-31 ENCOUNTER — Ambulatory Visit (INDEPENDENT_AMBULATORY_CARE_PROVIDER_SITE_OTHER): Payer: Medicare HMO

## 2020-07-31 DIAGNOSIS — Z23 Encounter for immunization: Secondary | ICD-10-CM | POA: Diagnosis not present

## 2020-07-31 NOTE — Progress Notes (Signed)
.     Covid-19 Vaccination Clinic  Name:  Sandra Burns    MRN: [id number] DOB: July 17, 1956  07/31/2020  Sandra Burns was observed post Covid-19 immunization for 30 minutes without incident. She was provided with Vaccine Information Sheet and instruction to access the V-Safe system.   Sandra Burns was instructed to call 911 with any severe reactions post vaccine: Marland Kitchen Difficulty breathing  . Swelling of face and throat  . A fast heartbeat  . A bad rash all over body  . Dizziness and weakness   Immunizations Administered    Name Date Dose VIS Date Route   Pfizer COVID-19 Vaccine 07/31/2020 10:33 AM 0.3 mL 07/15/2020 Intramuscular   Manufacturer: [employer], [employer]   Lot: [id number]   NDC: [address]-6     Sandra Burns

## 2020-08-10 ENCOUNTER — Ambulatory Visit (INDEPENDENT_AMBULATORY_CARE_PROVIDER_SITE_OTHER): Payer: Medicare HMO | Admitting: Psychiatry

## 2020-08-10 DIAGNOSIS — M549 Dorsalgia, unspecified: Secondary | ICD-10-CM

## 2020-08-10 DIAGNOSIS — Z636 Dependent relative needing care at home: Secondary | ICD-10-CM

## 2020-08-10 DIAGNOSIS — A310 Pulmonary mycobacterial infection: Secondary | ICD-10-CM | POA: Diagnosis not present

## 2020-08-10 DIAGNOSIS — F331 Major depressive disorder, recurrent, moderate: Secondary | ICD-10-CM

## 2020-08-10 DIAGNOSIS — G8929 Other chronic pain: Secondary | ICD-10-CM

## 2020-08-10 DIAGNOSIS — Z8659 Personal history of other mental and behavioral disorders: Secondary | ICD-10-CM

## 2020-08-10 NOTE — Progress Notes (Signed)
Psychotherapy Progress Note [facility] [name], PhD LP  [name]     MRN: [id number] Therapy format: Individual psychotherapy Date: 08/10/2020      Start: 3:28a     Stop: 4:15p     Time Spent: 47 min Location: Telehealth visit -- I connected with this patient by an approved telecommunication method (audio only), with her informed consent, and verifying identity and patient privacy.  I was located at my office and patient at her home.  As needed, we discussed the limitations, risks, and security and privacy concerns associated with telehealth service, including the availability and conditions which currently govern in-person appointments and the possibility that 3rd-party payment may not be fully guaranteed and she may be responsible for charges.  After she indicated understanding, we proceeded with the session.  Also discussed treatment planning, as needed, including ongoing verbal agreement with the plan, the opportunity to ask and answer all questions, her demonstrated understanding of instructions, and her readiness to call the office should symptoms worsen or she feels she is in a crisis state and needs more immediate and tangible assistance.   Session narrative (presenting needs, interim history, self-report of stressors and symptoms, applications of prior therapy, status changes, and interventions made in session) In back pain today, forgot the appointment.  Reached 30 min in by phone, elected to continue that way.    Been in back pain lately, attributed in part to bad shocks on the car.  Did have a positive outing with husband recently, driving to a park in [place].  Felt more normal being out than she has in a long time, and good to be going [place] [address] without going to [facility] for dire medical needs.  Refreshing to breathe outdoors without masking, compared to so much shut-in experience.  Depression has lifted some.  Anticipates some  holiday gathering, with precautions, and she and H have spring plans to get out for a vintage trailer camping experience near [place].  Other ideas floating, and the planning helps lift spirits.    Got her vaccination recently, researching interactions between her steroid inhaler and immunity, trying to cut back as able and make sure she doesn't swallow residue.  Been on highest allowable for years.  Pulmonologist supportive in cutting back dose.  Reading says inhaled corticosteroid is small enough and noninvasive enough it would take a year of inhaler use to equal one standard course of prednisone.  2nd shot had intense SE of muscle aches, chills, joint pain, and flulike sensations, coped with Tylenol.  Figures from her reading she won't need a 3rd dose, given she is not on biologics.  Glad she had a more normal immune response to vax, reassuring about her immune health.    Affirmed pursuing normalcy more, accepting controlled risk, and working together with husband on constructive futures.  Therapeutic modalities: Cognitive Behavioral Therapy, Solution-Oriented/Positive Psychology and Ego-Supportive  Mental Status/Observations:  Appearance:   Not assessed   Behavior:  Appropriate  Motor:  Not assessed  Speech/Language:   Clear and Coherent  Affect:  Not assessed  Mood:  dysthymic  Thought process:  normal  Thought content:    WNL  Sensory/Perceptual disturbances:    WNL  Orientation:  Fully oriented  Attention:  Fair    Concentration:  Good  Memory:  WNL  Insight:    Good  Judgment:   Good  Impulse Control:  Good   Risk Assessment: Danger to Self: No Self-injurious Behavior:  No Danger to Others: No Physical Aggression / Violence: No Duty to Warn: No Access to Firearms a concern: No  Assessment of progress:  stabilized  Diagnosis:   ICD-10-CM   1. Moderate episode of recurrent major depressive disorder (HCC)  F33.1   2. Caregiver stress  Z63.6   3. Other chronic back pain   M54.9    G89.29   4. 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 History of posttraumatic stress disorder (PTSD)  Z86.59    Plan:  . Self-care for pain . Work with H on Product manager  . Seek professional guidance where needed to resolve medication concerns . Get outdoors was able . Other recommendations/advice as may be noted above . Continue to utilize previously learned skills ad lib . Maintain medication as prescribed and work faithfully with relevant prescriber(s) if any changes are desired or seem indicated . Call the clinic on-call service, present to ER, or call 911 if any life-threatening psychiatric crisis Return for time as available, will call. . Already scheduled visit in this office 11/02/2020.  [name], PhD [name], PhD LP Clinical Psychologist, [facility] [address], [place],  [address] [phone]

## 2020-09-29 ENCOUNTER — Other Ambulatory Visit: Payer: Self-pay | Admitting: Physician Assistant

## 2020-10-13 ENCOUNTER — Telehealth: Payer: Self-pay

## 2020-10-13 NOTE — Telephone Encounter (Signed)
Prior Authorization approved for ALPRAZOLAM for her quantity limit #165/90 day supply. With [employer] 09/26/2020-09/25/2021 Member XB#[id number]

## 2020-10-19 ENCOUNTER — Encounter: Payer: Self-pay | Admitting: Physician Assistant

## 2020-10-19 ENCOUNTER — Telehealth: Payer: Medicare HMO | Admitting: Physician Assistant

## 2020-10-19 DIAGNOSIS — F411 Generalized anxiety disorder: Secondary | ICD-10-CM

## 2020-10-19 DIAGNOSIS — F329 Major depressive disorder, single episode, unspecified: Secondary | ICD-10-CM | POA: Diagnosis not present

## 2020-10-19 DIAGNOSIS — Z636 Dependent relative needing care at home: Secondary | ICD-10-CM

## 2020-10-19 NOTE — Progress Notes (Signed)
[facility]  [name],  MRN: [id number]  PCP: Tracey Harries, FNP  Date of Evaluation: 10/19/2020 Time spent:20 minutes  Chief Complaint:  Chief Complaint    Anxiety; Depression     Virtual Visit via Telehealth  I connected with patient by a video enabled telemedicine application with their informed consent, and verified patient privacy and that I am speaking with the correct person using two identifiers.  I am private, in my office and the patient is at home.  I discussed the limitations, risks, security and privacy concerns of performing an evaluation and management service by video and the availability of in person appointments. I also discussed with the patient that there may be a patient responsible charge related to this service. The patient expressed understanding and agreed to proceed.   I discussed the assessment and treatment plan with the patient. The patient was provided an opportunity to ask questions and all were answered. The patient agreed with the plan and demonstrated an understanding of the instructions.   The patient was advised to call back or seek an in-person evaluation if the symptoms worsen or if the condition fails to improve as anticipated.  I provided 20 minutes of non-face-to-face time during this encounter.  HISTORY/CURRENT STATUS: For routine med check.  States she's doing fine with the Xanax. Anxiety is controlled. Is able to enjoy things, escapes in her crafts which takes her away emotionally, isolates b/c covid, states she's sad sometimes, just b/c of what's going on in the world, but overall she is doing really well.  She does not cry easily.  Appetite and weight are stable.  Denies suicidal or homicidal thoughts.  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  He is at home.  Sometimes that is very stressful depending on the type of day he is having.  Patient denies increased  energy with decreased need for sleep, no increased talkativeness, no racing thoughts, no impulsivity or risky behaviors, no increased spending, no increased libido, no grandiosity, no paranoia, no AH/VH.  Denies dizziness, syncope, seizures, numbness, tingling, tremor, tics, unsteady gait, slurred speech, confusion. Denies muscle or joint pain, stiffness, or dystonia.  Individual Medical History/ Review of Systems: Changes? :No    Past medications for mental health diagnoses include: Zoloft, caused psychosis w/ hallucinations,  BuSpar, Ativan, Effexor (became psychotic) She was told to never take any kind of SSRI/SNRI again.  Allergies: Albuterol, Cephalosporins, Flagyl [metronidazole], Macrobid [nitrofurantoin macrocrystal], Sulfa antibiotics, Vancomycin, Vicodin [hydrocodone-acetaminophen], Amoxicillin, Ciprofloxacin, Bacitracin-neomycin-polymyxin, Tobramycin, Decongest-aid [pseudoephedrine], Eggshell membrane (chicken) [egg shells], Latex, Levofloxacin, Prednisone, and Zithromax [azithromycin]  Current Medications:  Current Outpatient Medications:  .  ALPRAZolam (XANAX) 0.5 MG tablet, TAKE 1 & 1/2 TABS IN AM, 1 & 1/2 TABS AT LUNCH, 1 TAB AT DINNER, 1 & 1/2 TABS AT BEDTIME AS NEEDED, Disp: 165 tablet, Rfl: 5 .  Artificial Tear Ointment (REFRESH P.M. OP), Place 1 application into both eyes at bedtime. Apply eye gel to both eyelids, Disp: , Rfl:  .  aspirin 81 MG chewable tablet, Chew 81 mg by mouth daily., Disp: , Rfl:  .  atenolol (TENORMIN) 25 MG tablet, Take 12.5 mg by mouth 2 (two) times daily. , Disp: , Rfl:  .  budesonide (PULMICORT) 180 MCG/ACT inhaler, Inhale 2 puffs into the lungs 2 (two) times daily. , Disp: , Rfl:  .  Cholecalciferol (VITAMIN D3) 10 MCG (400 UNIT) tablet, Take by mouth., Disp: , Rfl:  .  dicyclomine (BENTYL) 20 MG tablet, Take 1 tablet (20 mg total) by mouth 2 (two) times daily., Disp: 20 tablet, Rfl: 0 .  doxycycline (VIBRA-TABS) 100 MG tablet, , Disp: , Rfl:  .   fluticasone (FLONASE) 50 MCG/ACT nasal spray, Place 1 spray into both nostrils daily as needed for allergies., Disp: , Rfl: 6 .  ipratropium (ATROVENT HFA) 17 MCG/ACT inhaler, Inhale 2 puffs into the lungs 4 (four) times daily., Disp: , Rfl:  .  Multiple Vitamin (MULTIVITAMIN WITH MINERALS) TABS, Take 1 tablet by mouth daily., Disp: , Rfl:  .  mupirocin ointment (BACTROBAN) 2 %, APPLY TO AFFECTED AREA 3 TIMES A DAY, Disp: , Rfl:  .  pantoprazole (PROTONIX) 40 MG tablet, Take 40 mg by mouth daily as needed (GERD). For stomach, Disp: , Rfl:  .  Polyvinyl Alcohol-Povidone (REFRESH OP), Place 1 drop into both eyes daily as needed (dry eyes)., Disp: , Rfl:  .  ZIRGAN 0.15 % GEL, APPLY 1 A SMALL AMOUNT IN AFFECTED EYE 5 TIMES A DAY UNTIL DIRECTED, Disp: , Rfl:  Medication Side Effects: none  Family Medical/ Social History: Changes? No  MENTAL HEALTH EXAM:  There were no vitals taken for this visit.There is no height or weight on file to calculate BMI.  General Appearance: Casual, Neat and Well Groomed  Eye Contact:  Good  Speech:  Clear and Coherent and Normal Rate  Volume:  Normal  Mood:  Euthymic  Affect:  Appropriate  Thought Process:  Goal Directed and Descriptions of Associations: Intact  Orientation:  Full (Time, Place, and Person)  Thought Content: Logical   Suicidal Thoughts:  No  Homicidal Thoughts:  No  Memory:  WNL  Judgement:  Good  Insight:  Good  Psychomotor Activity:  Normal  Concentration:  Concentration: Good and Attention Span: Good  Recall:  Good  Fund of Knowledge: Good  Language: Good  Assets:  Desire for Improvement  ADL's:  Intact  Cognition: WNL  Prognosis:  Good    DIAGNOSES:    ICD-10-CM   1. Generalized anxiety disorder  F41.1   2. Caregiver stress  Z63.6   3. 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 [name]   RECOMMENDATIONS:  PDMP reviewed. I provided 20 minutes of non-face to face time during this encounter  again discussing the benefits of an SSRI for the anxiety, and mild depression.  She was told never to take an antidepressant again because she was psychotic on Effexor I believe at any rate she prefers not to try another medication.  At some point, she wants to continue weaning down on the Xanax but not right now because of the stress she is under. Continue Xanax 0.5 mg, 1.5 pills every morning, 1.5 pill at lunch, 1 pill at dinner, 1.5 at bedtime as needed. Continue psychotherapy with Dr. [name].  Return in 6 months.  Melony Overly, PA-C

## 2020-10-28 ENCOUNTER — Ambulatory Visit (INDEPENDENT_AMBULATORY_CARE_PROVIDER_SITE_OTHER): Payer: Medicare HMO | Admitting: Psychiatry

## 2020-10-28 DIAGNOSIS — Z636 Dependent relative needing care at home: Secondary | ICD-10-CM | POA: Diagnosis not present

## 2020-10-28 DIAGNOSIS — F329 Major depressive disorder, single episode, unspecified: Secondary | ICD-10-CM

## 2020-10-28 DIAGNOSIS — G8929 Other chronic pain: Secondary | ICD-10-CM

## 2020-10-28 DIAGNOSIS — M549 Dorsalgia, unspecified: Secondary | ICD-10-CM

## 2020-10-28 DIAGNOSIS — F411 Generalized anxiety disorder: Secondary | ICD-10-CM

## 2020-10-28 DIAGNOSIS — Z8659 Personal history of other mental and behavioral disorders: Secondary | ICD-10-CM

## 2020-10-28 NOTE — Progress Notes (Signed)
Psychotherapy Progress Note [facility] [name], PhD LP  [name]     MRN: [id number] Therapy format: Individual psychotherapy Date: 10/28/2020      Start: 2:12p     Stop: 3:03p     Time Spent: 51 min Location: Telehealth visit -- I connected with this patient by an approved telecommunication method (video), with her informed consent, and verifying identity and patient privacy.  I was located at my office and patient at her home.  As needed, we discussed the limitations, risks, and security and privacy concerns associated with telehealth service, including the availability and conditions which currently govern in-person appointments and the possibility that 3rd-party payment may not be fully guaranteed and she may be responsible for charges.  After she indicated understanding, we proceeded with the session.  Also discussed treatment planning, as needed, including ongoing verbal agreement with the plan, the opportunity to ask and answer all questions, her demonstrated understanding of instructions, and her readiness to call the office should symptoms worsen or she feels she is in a crisis state and needs more immediate and tangible assistance.   Session narrative (presenting needs, interim history, self-report of stressors and symptoms, applications of prior therapy, status changes, and interventions made in session) More depressed today.  Med check last week with Ms. Claybon Jabs, better then.  [name] has been very challenging lately, and last night she blew up at him over repetitive questions, screwing up procedures for decontaminating groceries, and now he's developed a fixation on coffee and getting irritable at a missing shipment of K cups.  Altercation over waiting period letting a replacement delivery sit, while she was washing dishes and experiencing tachycardia and mild shortness of breath.  Childish reactions when confronted about rushing procedure, and  enduring a lot of "Can I?" questions, picayune reactions to language and facts, and more memory disorder signs like copycatting the thing she just said as if it was an original thought.  Meanwhile, friend had colon surgery and a large, blocking mass removed, with news of metastasis, and she's been doubtful and irritable when Saleema answers her medical questions.  Admittedly, it's acutely lonely having to deal with these things, especially [name]'s eroding mental status.  At times he complains of being scolded, which both irritates and shames her.  Tomorrow 15th anniversary of mother's death.  Dream last night of hometown, knows it was primed by a conversation yesterday, dreamt of father and beloved cousin of mother.    Other annoyances from [facility] sometimes creeping around, sometimes waking her inadvertently (e.g., compulsively jumping on filter change for the HVAC), and recently confessing he set up automatic payments on an account that belongs to him alone, which threatens to complicate matters if she has to handle financial affairs after his death.  [name] has had an intermittent problem with lashing out in his sleep sometimes, too.  C/o him getting defensive, in adolescent fashion, often enough to be irritating.  Between awareness, judgement, impulse control, reasoning, and other symptoms, all consistent with dementia, with suspected etiologies in multiple anesthesia experiences, longterm diabetes, and aging.  Dismal to see it, again, how the person she used to know before devastating pancreatic disease really never came home, mentally, and tired of fighting to stay up for things that need her, and how she gets strung between righteous indignation and the need to be supremely understanding of a brain-damage loved one.  Unfortunately, he is still astute and worrisome enough to take her emotional  expressions presume she is going to do drastic things when she won't.  He does seem to be starting to catch on to  his own eroding attention and memory, though.  Support/empathy provided.    Re. OCD and cleanliness compulsions, reviewed medical standards she knows for viability of viruses, etc. on surfaces.  Challenged to just make sure she's not letting OCD exaggerate reasonable sanitation into self-harming disease.  Otherwise, addressed tending her limits with friends who solicit support and information from her.  OK to inform, OK to ask to wait.  Prone to worry she'll be abandoned by them if she sets limits, though with some friends it doesn't feel like it matters if they would.  Preoccupied with feeling drained dry by people with needs and opinions coming at her, tends to ruminate about them coming at her despite knowing she is chronically fatigued.  Agreed [name] is her inner circle and priority, and friends can accept "office hours" if she makes them.  Assisted getting her head around the necessity of "being his mother" sometimes, and being better able to choose how she approaches it when he is immature or overwhelmed.  Therapeutic modalities: Cognitive Behavioral Therapy, Solution-Oriented/Positive Psychology, Ego-Supportive and Assertiveness/Communication  Mental Status/Observations:  Appearance:   Casual     Behavior:  Appropriate  Motor:  Normal  Speech/Language:   Clear and Coherent  Affect:  Appropriate  Mood:  dysthymic  Thought process:  normal  Thought content:    WNL  Sensory/Perceptual disturbances:    WNL  Orientation:  Fully oriented  Attention:  Good    Concentration:  Fair  Memory:  WNL  Insight:    Good  Judgment:   Good  Impulse Control:  Good   Risk Assessment: Danger to Self: No Self-injurious Behavior: No Danger to Others: No Physical Aggression / Violence: No Duty to Warn: No Access to Firearms a concern: No  Assessment of progress:  stabilized  Diagnosis:   ICD-10-CM   1. Reactive depression  F32.9   2. Caregiver stress  Z63.6   3. Generalized anxiety disorder   F41.1   4. History of posttraumatic stress disorder (PTSD)  Z86.59   5. Other chronic back pain  M54.9    G89.29    Plan:  . If possible, see about getting [name] to get her on accounts as a cosigner to remove roadblocks to taking care of business should he pass away . Consider "office hours" for helping friends -- arbitrary times she may wait to return a message, or when available to answer medical questions and tender advice.  OK to ask, straightforwardly, for listening and support of her own. . Notice and challenge any excessive standards of cleanliness . Write down and routinize as much as possible procedures she needs [name] to abide by . Other recommendations/advice as may be noted above . Continue to utilize previously learned skills ad lib . Maintain medication as prescribed and work faithfully with relevant prescriber(s) if any changes are desired or seem indicated . Call the clinic on-call service, present to ER, or call 911 if any life-threatening psychiatric crisis Return for time as available. . Already scheduled visit in this office Visit date not found.  [name] Fries, PhD [name], PhD LP Clinical Psychologist, [facility] [address] [place], [place] [address] [phone]

## 2020-11-02 ENCOUNTER — Telehealth: Payer: Medicare HMO | Admitting: Physician Assistant

## 2020-11-04 ENCOUNTER — Telehealth: Payer: Medicare HMO | Admitting: Physician Assistant

## 2020-11-16 ENCOUNTER — Ambulatory Visit (INDEPENDENT_AMBULATORY_CARE_PROVIDER_SITE_OTHER): Payer: Medicare HMO | Admitting: Psychiatry

## 2020-11-16 DIAGNOSIS — Z639 Problem related to primary support group, unspecified: Secondary | ICD-10-CM

## 2020-11-16 DIAGNOSIS — M549 Dorsalgia, unspecified: Secondary | ICD-10-CM | POA: Diagnosis not present

## 2020-11-16 DIAGNOSIS — F422 Mixed obsessional thoughts and acts: Secondary | ICD-10-CM

## 2020-11-16 DIAGNOSIS — Z8659 Personal history of other mental and behavioral disorders: Secondary | ICD-10-CM

## 2020-11-16 DIAGNOSIS — F411 Generalized anxiety disorder: Secondary | ICD-10-CM | POA: Diagnosis not present

## 2020-11-16 DIAGNOSIS — A310 Pulmonary mycobacterial infection: Secondary | ICD-10-CM

## 2020-11-16 DIAGNOSIS — G8929 Other chronic pain: Secondary | ICD-10-CM

## 2020-11-16 DIAGNOSIS — F331 Major depressive disorder, recurrent, moderate: Secondary | ICD-10-CM | POA: Diagnosis not present

## 2020-11-16 DIAGNOSIS — Z630 Problems in relationship with spouse or partner: Secondary | ICD-10-CM

## 2020-11-16 NOTE — Progress Notes (Signed)
Psychotherapy Progress Note [facility] [name], PhD LP  [name]     MRN: [id number] Therapy format: Individual psychotherapy Date: 11/16/2020      Start: 9:11a     Stop: 10:08a     Time Spent: 45 min (remainder donated) Location: Telehealth visit -- I connected with this patient by an approved telecommunication method (video), with her informed consent, and verifying identity and patient privacy.  I was located at my office and patient at her home.  As needed, we discussed the limitations, risks, and security and privacy concerns associated with telehealth service, including the availability and conditions which currently govern in-person appointments and the possibility that 3rd-party payment may not be fully guaranteed and she may be responsible for charges.  After she indicated understanding, we proceeded with the session.  Also discussed treatment planning, as needed, including ongoing verbal agreement with the plan, the opportunity to ask and answer all questions, her demonstrated understanding of instructions, and her readiness to call the office should symptoms worsen or she feels she is in a crisis state and needs more immediate and tangible assistance.   Session narrative (presenting needs, interim history, self-report of stressors and symptoms, applications of prior therapy, status changes, and interventions made in session) [name] seems to have been less difficult lately.  Now [name] (fellow patient -- out of touch a while) seems in more trouble lately and deeply disappointed recently.  Gillermo Murdoch dx'd colon cancer for a month now, hx of asking a lot of medical questions of Aleicia, needs help with foster horses.  Julea can't handle hay or manure with her lungs or back, and [name] is not physically capable, so ensued messy communication trying to match up [place] to recommend a local farmer to help, only [name] forgot, then she  committed Iona Beard in a way that seems to be both doomed to fail and passive-aggressive toward him, setting up [place] and her husband to count on unreliable help.  Shade irritated how it would reflect on her, though Lattie Haw and her husband clear they understand.  When [name] and Iona Beard went two days past the pledge to show up, it still embarrassed and angered Milanie badly, and when she started nonrespondent to [name]'s calls, [name] started up with syrupy texts that irritated her worse.  When finally connected, [name] turned the subject back to her worn-out tales of drama with the [facility] until eventually connected the fact that they had ben noshow's for [place] and she had to make other arrangements.  Susanna put up with babbling about Iona Beard failing responsibility, rapid changes of subject, and then urgent efforts to drive george to make belated good on the offer of help.  Now [name], "the master of cynicism", is predicting [name] will start posting flowery social media pictures about the horses and enjoying them so, as cover.  Allowed to vent, reassured she is not responsible for other people's failures, true friends like Lattie Haw will see it for what it is, and what [name] does with her social media is entirely inconsequential.  As [name] is still a friend, other concerns abide for her overcommitting herself with care of grandson and the effects of longterm pain medication.  Struggling with whether she can trust [name] at all any more, especially after having had confidences repeated last year in the throes of the [facility] flap.  Ciena realizes on reflection she has a problem overidentifying with other people's pain, and she's concerned about [name] overreacting  if she gives feedback.  Assured it's OK to be honest, best to be constructive about what behavior makes it worse and what might make it better, and she is not responsible for titrating the pain other people have learning heir own lessons, just being fair about it when she must  be the one to bring it to light.  Discussed what to expect if [name] calls and tries to stay flowery, avoid the elephant in the room about failed responsibility.  Advised try to find a way to acknowledge inconvenience and embarrassment, lest [name] read in resentment for trying to gloss over the subject and then -- if true -- reframe it as water under the bridge.  Beyond that, let [name]/George deal directly with Lisa/Dale and get out of the middle of it, let the "matchmaking" done play out as it will, however the "matched" decide.  If [name] tries to ingratiate to [place] and oversell her renewed efforts, be willing to ask if she's afraid Aislyn is angry, then assure as needed.  Recognizes that part of what make [name]'s behavior so electric to her is that her caretaking inconveniently reminds Marianita of her own, disliked past.  Affirmed/encouraged in being more straightforward, less obsessed., more trusting of honesty.  Insomnia bad for a week.  One night took till 5am to fall asleep.  Only 5 hrs last night.  Affirmed and encouraged in sleep readiness, consciously setting worry time and entrusting what cannot be controlled at night to the next day.  Therapeutic modalities: Cognitive Behavioral Therapy and Solution-Oriented/Positive Psychology  Mental Status/Observations:  Appearance:   Casual     Behavior:  Appropriate  Motor:  Normal  Speech/Language:   Clear and Coherent  Affect:  Appropriate  Mood:  dysthymic  Thought process:  normal  Thought content:    WNL  Sensory/Perceptual disturbances:    WNL  Orientation:  Fully oriented  Attention:  Good    Concentration:  Fair  Memory:  WNL  Insight:    Good  Judgment:   Good  Impulse Control:  Good   Risk Assessment: Danger to Self: No Self-injurious Behavior: No Danger to Others: No Physical Aggression / Violence: No Duty to Warn: No Access to Firearms a concern: No  Assessment of progress:  stabilized  Diagnosis:   ICD-10-CM   1.  Generalized anxiety disorder  F41.1   2. Moderate episode of recurrent major depressive disorder (HCC)  F33.1   3. History of posttraumatic stress disorder (PTSD)  Z86.59   4. Other chronic back pain  M54.9    G89.29   5. Relationship problem between partners  Z63.0   6. 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 Mixed obsessional thoughts and acts  F42.2   8. Relationship problems  Z63.9    Plan:  . Practice entrusting worries to the night an the next day's energy and attention . Practice trusting the process if more open with her friend about unwanted behavior . Other recommendations/advice as may be noted above . Continue to utilize previously learned skills ad lib . Maintain medication as prescribed and work faithfully with relevant prescriber(s) if any changes are desired or seem indicated . Call the clinic on-call service, present to ER, or call 911 if any life-threatening psychiatric crisis Return for time as available. . Already scheduled visit in this office 04/26/2021.  [name], PhD [name], PhD LP Clinical Psychologist, [facility] [address], [place],  [place] [address] (o) [phone]

## 2020-12-09 ENCOUNTER — Telehealth: Payer: Self-pay

## 2020-12-09 ENCOUNTER — Other Ambulatory Visit: Payer: Self-pay

## 2020-12-09 ENCOUNTER — Encounter: Payer: Self-pay | Admitting: Infectious Disease

## 2020-12-09 ENCOUNTER — Telehealth (INDEPENDENT_AMBULATORY_CARE_PROVIDER_SITE_OTHER): Payer: Medicare HMO | Admitting: Infectious Disease

## 2020-12-09 DIAGNOSIS — J471 Bronchiectasis with (acute) exacerbation: Secondary | ICD-10-CM

## 2020-12-09 DIAGNOSIS — L4050 Arthropathic psoriasis, unspecified: Secondary | ICD-10-CM

## 2020-12-09 DIAGNOSIS — Z889 Allergy status to unspecified drugs, medicaments and biological substances status: Secondary | ICD-10-CM

## 2020-12-09 DIAGNOSIS — B4481 Allergic bronchopulmonary aspergillosis: Secondary | ICD-10-CM

## 2020-12-09 DIAGNOSIS — I341 Nonrheumatic mitral (valve) prolapse: Secondary | ICD-10-CM | POA: Diagnosis not present

## 2020-12-09 DIAGNOSIS — A310 Pulmonary mycobacterial infection: Secondary | ICD-10-CM

## 2020-12-09 DIAGNOSIS — J4520 Mild intermittent asthma, uncomplicated: Secondary | ICD-10-CM

## 2020-12-09 DIAGNOSIS — Z7185 Encounter for immunization safety counseling: Secondary | ICD-10-CM

## 2020-12-09 HISTORY — DX: Encounter for immunization safety counseling: Z71.85

## 2020-12-09 NOTE — Telephone Encounter (Signed)
-----   Message from Randall Hiss, MD sent at 12/09/2020 12:34 PM EDT ----- Regarding: 3rd COVID vaccine can we bring her in to [facility] to do, she has mx allergies and hdd difficulty getting vaccine at [facility]

## 2020-12-09 NOTE — Telephone Encounter (Signed)
RN spoke to patient to schedule pfizer booster. Patient wondering if she can get booster sooner than 5 months from second dose. Will route to provider.   [name], RN

## 2020-12-09 NOTE — Telephone Encounter (Signed)
Definitely I have not followed that rule with omicron having raised its head. Would get her 3rd dose asap

## 2020-12-09 NOTE — Progress Notes (Signed)
Virtual Visit via Phone Note  I connected with Sandra Burns on 12/09/20 at  2:30 PM EDT by a telephone  and verified that I am speaking with the correct person using two identifiers.  Location: Patient: Home Provider: [facility]   I discussed the limitations of evaluation and management by telemedicine and the availability of in person appointments. The patient expressed understanding and agreed to proceed.  History of Present Illness:   Sandra Burns is a 65 year-old Caucasian female with a past medical history for asthma, COPD, apparent mitral valve prolapse, atrial fibrillation who had developed dyspnea and ultimately underwent evaluation in the emergency department in December of this past year.  Her d-dimer was slightly positive and she underwent CT angiogram.  It was negative for pulmonary embolism but did show a Mild tree-in-bud nodularity in the right lower lobe.   She has been evaluated by pulmonary [place] who reviewed her films and found that she might have very mild bronchiectatic changes confined to the left and right upper lobe.  Patient has undergone repeat imaging in [place] which again showed some nodularity.  The question of whether she might have Mycobacterium avium was raised  Interim the patient who had not been coughing has been doing all sorts of maneuvers to try to get herself to cough up sputum including sleeping in a recliner and doing other maneuvers to get sputum up such as using a flutter valve.  She was able to get 3 sputum's for submission and 2 of the 3 did not grow any organisms the third grew Mycobacterium avium complex.  She didnot seem to cough at all except if she is trying to get sputum up at which she does cheat chiefly at night.  She is without fevers she is without weight loss.  She did however have some dyspnea on exertion that is been going on for several months.  In talking to her during my EVISITS  I was not at all  convinced that she has clear-cut active Mycobacterium avium infection.  Certainly she did snot meet ATS guidelines definition for such infection.  I have discussed with her that even if she did have evidence of Mycobacterium infection her symptom complex to me would not be wanting which I would be pushing hard for her to be on therapy.  After careful discussion I felt it prudent to not initiate therapy but to revisit how she is doing in roughly 3 to 4 months time.  We did contemplate repeating the CT scan 6 months after the most recent one which was done in January 2020.  09/04/2019: In talking today she says that  her lungs are "doing well."   She has been doing technique of graduated breathing she learned from support group, deep breath, blow out long and after 3-4, then breath in and blow out all the way. It helps apparently move sputum from lower int he lungs up and allows her to get sputum out.   She clarified that her reaction to azithromycin was that she had loose stools and was diagnosed with C. difficile colitis.  Her allergies ciprofloxacin was 1 in which she had swelling in the arm where the infusion was taking place along with a rash.  She does state that she had mucous membrane involvement when given metronidazole for C. difficile colitis.  Amoxicillin was an allergy that caused itching and apparently some swelling but this happened more than 10 years ago and did not result in hospitalization.  I have  updated the answers of the pertinent questions in the allergy portion.  [name] has received 2 COVID-19 vaccines 1 in our clinic.  She says she has had trouble getting vaccines due to the fact that apparently some of the pharmacies have been apprehensive of her multiple allergies.  She did have a flare of her psoriatic arthritis after her second dose of COVID-19.  I do think that she clearly should get a third dose and she asked if that could be done through our clinic  which I will help arrange.  She continues to do well and is in fact better than the last time I saw her.  She is still quite concerned about risk of getting COVID-19 and continues to largely live at home with her husband continuing to have food delivered to the door and continue to stay inside.  Her allergies have also not been flaring much recently though she is apprehensive with the trees going into bloom now.  She is followed up with her pulmonologist who is at [facility] as well.  Had several extensive discussions about Mycobacterium avium infection.  We also went through her history of aspergillus infection though she never was treated for infection rather it seems to be reactive airway disease.  Her CT scan that was done in January 2020 does not show any concerning findings I am not eager to repeat CT scans and exposure to further radiation without need.         Observations/Objective:  Ahonesty again appeared to be doing quite well over the phone.   Assessment and Plan:  Mycobacterium avium infection: She clearly does not meet criteria for treatment and is not interested in treatment.  We will have her come back and follow-up with me in 1 years time.  If she is continue to well at that point time I will have her follow-up as needed  COVID prevention: We will have her come in for a third vaccine will talk to staff    Follow Up Instructions:    I discussed the assessment and treatment plan with the patient. The patient was provided an opportunity to ask questions and all were answered. The patient agreed with the plan and demonstrated an understanding of the instructions.   The patient was advised to call back or seek an in-person evaluation if the symptoms worsen or if the condition fails to improve as anticipated.   I spent 27  minutes in counseling the patient over the phone.

## 2020-12-10 NOTE — Telephone Encounter (Signed)
RN left voicemail requesting callback to schedule patient's Covid booster sooner than 12/29/20.   [name], RN

## 2020-12-10 NOTE — Telephone Encounter (Signed)
Patient scheduled for pfizer booster 12/14/20 per Dr. Daiva Eves.   [name], RN

## 2020-12-14 ENCOUNTER — Ambulatory Visit: Payer: Medicare HMO

## 2020-12-18 ENCOUNTER — Ambulatory Visit: Payer: Medicare HMO

## 2020-12-24 ENCOUNTER — Other Ambulatory Visit: Payer: Self-pay

## 2020-12-24 ENCOUNTER — Ambulatory Visit (INDEPENDENT_AMBULATORY_CARE_PROVIDER_SITE_OTHER): Payer: Medicare HMO

## 2020-12-24 DIAGNOSIS — Z23 Encounter for immunization: Secondary | ICD-10-CM | POA: Diagnosis not present

## 2020-12-24 NOTE — Progress Notes (Signed)
   Covid-19 Vaccination Clinic  Name:  Sandra Burns    MRN: [id number] DOB: 16-Dec-1955  12/24/2020  Ms. Sandra Burns was observed post Covid-19 immunization for 30 minutes based on pre-vaccination screening without incident. She was provided with Vaccine Information Sheet and instruction to access the V-Safe system.   [name] was instructed to call 911 with any severe reactions post vaccine: Marland Kitchen Difficulty breathing  . Swelling of face and throat  . A fast heartbeat  . A bad rash all over body  . Dizziness and weakness      Rosanna Randy, RN

## 2020-12-25 ENCOUNTER — Ambulatory Visit: Payer: Medicare HMO

## 2020-12-29 ENCOUNTER — Ambulatory Visit: Payer: Medicare HMO

## 2021-01-14 ENCOUNTER — Ambulatory Visit (INDEPENDENT_AMBULATORY_CARE_PROVIDER_SITE_OTHER): Payer: Medicare HMO | Admitting: Psychiatry

## 2021-01-14 DIAGNOSIS — F411 Generalized anxiety disorder: Secondary | ICD-10-CM

## 2021-01-14 DIAGNOSIS — F331 Major depressive disorder, recurrent, moderate: Secondary | ICD-10-CM | POA: Diagnosis not present

## 2021-01-14 DIAGNOSIS — Z630 Problems in relationship with spouse or partner: Secondary | ICD-10-CM

## 2021-01-14 DIAGNOSIS — A310 Pulmonary mycobacterial infection: Secondary | ICD-10-CM

## 2021-01-14 DIAGNOSIS — F422 Mixed obsessional thoughts and acts: Secondary | ICD-10-CM

## 2021-01-14 DIAGNOSIS — Z8659 Personal history of other mental and behavioral disorders: Secondary | ICD-10-CM

## 2021-01-14 DIAGNOSIS — Z636 Dependent relative needing care at home: Secondary | ICD-10-CM
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
## 2021-01-26 ENCOUNTER — Other Ambulatory Visit: Payer: Self-pay | Admitting: Internal Medicine

## 2021-01-26 DIAGNOSIS — Z1231 Encounter for screening mammogram for malignant neoplasm of breast: Secondary | ICD-10-CM

## 2021-01-27 ENCOUNTER — Ambulatory Visit (INDEPENDENT_AMBULATORY_CARE_PROVIDER_SITE_OTHER): Payer: Medicare HMO | Admitting: Psychiatry

## 2021-01-27 DIAGNOSIS — Z639 Problem related to primary support group, unspecified: Secondary | ICD-10-CM

## 2021-01-27 DIAGNOSIS — F422 Mixed obsessional thoughts and acts: Secondary | ICD-10-CM

## 2021-01-27 DIAGNOSIS — Z8659 Personal history of other mental and behavioral disorders: Secondary | ICD-10-CM

## 2021-01-27 DIAGNOSIS — F331 Major depressive disorder, recurrent, moderate: Secondary | ICD-10-CM | POA: Diagnosis not present

## 2021-01-27 DIAGNOSIS — F411 Generalized anxiety disorder: Secondary | ICD-10-CM | POA: Diagnosis not present

## 2021-01-27 DIAGNOSIS — Z636 Dependent relative needing care at home: Secondary | ICD-10-CM

## 2021-01-27 DIAGNOSIS — A310 Pulmonary mycobacterial infection: Secondary | ICD-10-CM

## 2021-01-27 NOTE — Progress Notes (Signed)
Psychotherapy Progress Note [facility] Sandra Moore, PhD LP  [name]     MRN: [id number] Therapy format: Individual psychotherapy Date: 01/27/2021      Start: 9:25a     Stop: 10:15a     Time Spent: 50 min Location: Telehealth visit -- I connected with this patient by an approved telecommunication method (video), with her informed consent, and verifying identity and patient privacy.  I was located at my office and patient at her home.  As needed, we discussed the limitations, risks, and security and privacy concerns associated with telehealth service, including the availability and conditions which currently govern in-person appointments and the possibility that 3rd-party payment may not be fully guaranteed and she may be responsible for charges.  After she indicated understanding, we proceeded with the session.  Also discussed treatment planning, as needed, including ongoing verbal agreement with the plan, the opportunity to ask and answer all questions, her demonstrated understanding of instructions, and her readiness to call the office should symptoms worsen or she feels she is in a crisis state and needs more immediate and tangible assistance.   Session narrative (presenting needs, interim history, self-report of stressors and symptoms, applications of prior therapy, status changes, and interventions made in session) Technical difficulties with Sandra Burns computer substantially delayed start.  C/o more intense insomnia and feelings of abandonment re. a friend.  Working on 5 hrs sleep past 2 nights.  3 weeks ago fielded daily calls from Sandra Burns, coincided with Sandra Burns freaking out more and being unreliable and fractious about cleanliness procedures, etc.  Felt flooded by Sandra Burns's calls, which became more rapid-fire, complaining about new drama with the Sandra Burns and some rambling about anxieties.  Struggling with signals that Sandra Burns hid her success getting involved in the  Sandra Burns convention, suppositions about why she would keep it private (when Sandra Burns helped her get in), and being mystified by how she blows back and forth being in touch and being out of touch, always seems like there's something more to it, cryptic comment about getting a "final disappointment", eventually checked with her whether there is anything personal, got some indication that Sandra Burns is feeling guilty about the possibility of overwhelming Sandra Burns.  Keeps trying to tell Sandra Burns she's actually hurting her feelings by doing this, which seems to only fuel Sandra Burns's own guilt and anxiety about screwing up relationship.  Attempted to get Sandra Burns to hear her own mixed messages of how it's OK to share, wants to be present for her, and feelings hurt by leaving it too quiet, but stuck on feeling misused by it.  Confronted letting her abandonment fears drive overanalysis and awfulizing, encouraged strongly to separate her own disappointment from the idea that it's Sandra Burns's pattern of "ghosting" or callously disregarding what she "knows" about Sandra Burns's own issues.  Encouraged -- despite fantasies of ticking off Sandra Burns -- that she can and probably should just try to assume less and ask Sandra Burns to call her instead of text.    Therapeutic modalities: Cognitive Behavioral Therapy, Solution-Oriented/Positive Psychology, and Ego-Supportive  Mental Status/Observations:  Appearance:   Casual     Behavior:  Appropriate  Motor:  Normal  Speech/Language:   Clear and Coherent  Affect:  Appropriate and Constricted  Mood:  dysthymic  Thought process:  normal  Thought content:    Rumination  Sensory/Perceptual disturbances:    WNL  Orientation:  Fully oriented  Attention:  Good    Concentration:  Fair  Memory:  WNL  Insight:  Good  Judgment:   Good  Impulse Control:  Good   Risk Assessment: Danger to Self: No Self-injurious Behavior: No Danger to Others: No Physical Aggression / Violence: No Duty to Warn: No Access to  Firearms a concern: No  Assessment of progress:  stable  Diagnosis:   ICD-10-CM   1. Generalized anxiety disorder  F41.1     2. Moderate episode of recurrent major depressive disorder (HCC)  F33.1     3. History of posttraumatic stress disorder (PTSD)  Z86.59     4. Relationship problems  Z63.9     5. Caregiver stress  Z63.6     6. Mixed obsessional thoughts and acts  F42.2     7. 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 Already scheduled visit in this office 04/26/2021.  Sandra Serve, PhD Sandra Moore, PhD LP Clinical Psychologist, [facility] [address], [place], [place] [address] [phone]

## 2021-02-17 ENCOUNTER — Telehealth (INDEPENDENT_AMBULATORY_CARE_PROVIDER_SITE_OTHER): Payer: Medicare HMO | Admitting: Psychiatry

## 2021-02-17 DIAGNOSIS — F422 Mixed obsessional thoughts and acts: Secondary | ICD-10-CM | POA: Diagnosis not present

## 2021-02-17 DIAGNOSIS — Z8659 Personal history of other mental and behavioral disorders: Secondary | ICD-10-CM

## 2021-02-17 DIAGNOSIS — F331 Major depressive disorder, recurrent, moderate: Secondary | ICD-10-CM | POA: Diagnosis not present

## 2021-02-17 DIAGNOSIS — F411 Generalized anxiety disorder: Secondary | ICD-10-CM | POA: Diagnosis not present

## 2021-02-17 DIAGNOSIS — Z636 Dependent relative needing care at home: Secondary | ICD-10-CM
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
## 2021-02-24 NOTE — Telephone Encounter (Signed)
So addended

## 2021-02-26 ENCOUNTER — Ambulatory Visit (INDEPENDENT_AMBULATORY_CARE_PROVIDER_SITE_OTHER): Payer: Medicare HMO | Admitting: Psychiatry

## 2021-02-26 DIAGNOSIS — Z636 Dependent relative needing care at home: Secondary | ICD-10-CM | POA: Diagnosis not present

## 2021-02-26 DIAGNOSIS — F411 Generalized anxiety disorder: Secondary | ICD-10-CM

## 2021-02-26 DIAGNOSIS — F331 Major depressive disorder, recurrent, moderate: Secondary | ICD-10-CM | POA: Diagnosis not present

## 2021-02-26 DIAGNOSIS — Z8659 Personal history of other mental and behavioral disorders: Secondary | ICD-10-CM

## 2021-02-26 DIAGNOSIS — Z8639 Personal history of other endocrine, nutritional and metabolic disease: Secondary | ICD-10-CM

## 2021-02-26 DIAGNOSIS — A310 Pulmonary mycobacterial infection: Secondary | ICD-10-CM

## 2021-02-26 DIAGNOSIS — F422 Mixed obsessional thoughts and acts: Secondary | ICD-10-CM

## 2021-02-26 NOTE — Progress Notes (Signed)
Psychotherapy Progress Note [facility] [name], PhD LP  [name]     MRN: [id number] Therapy format: Individual psychotherapy Date: 02/26/2021      Start: 3:07p     Stop: 3:57p     Time Spent: 50 min Location: Telehealth visit -- I connected with this patient by an approved telecommunication method (video), with her informed consent, and verifying identity and patient privacy.  I was located at my office and patient at her home.  As needed, we discussed the limitations, risks, and security and privacy concerns associated with telehealth service, including the availability and conditions which currently govern in-person appointments and the possibility that 3rd-party payment may not be fully guaranteed and she may be responsible for charges.  After she indicated understanding, we proceeded with the session.  Also discussed treatment planning, as needed, including ongoing verbal agreement with the plan, the opportunity to ask and answer all questions, her demonstrated understanding of instructions, and her readiness to call the office should symptoms worsen or she feels she is in a crisis state and needs more immediate and tangible assistance.   Session narrative (presenting needs, interim history, self-report of stressors and symptoms, applications of prior therapy, status changes, and interventions made in session) Weight loss has increased, so trying to eat Q 2 hrs, but losing still.  Noted tremor, wondered about blood sugar dysregulation, checked normal, then remembered 33 yrs ago having thyroiditis and how this matches the "thyroid storm" she had then.  Has requested to get thyroid assessed.    [name] now being assessed further for conditions affecting mental status -- B12, ammonia, ferritin.  Actively involved in comprehending and directing [name]'s increasingly complex care.  Conflict more manageable of late, doing her best not to get pricked by  things that could be taken as paranoid or hostile, and trying to allow for independent actions like driving himself to get labs.  Figures the stress of it has also been flaring her irritability, e.g., reacting to a friend's "dump" call.  Did get in touch with dementia caregiver resources, with prospect of getting involved with a phone call and virtual support group.  Insight that upcoming EEG and other tests may be sparking her anxiety what there may be to find.  Has used idea to visualize Toast as a child, helps forgive and accept otherwise irritating behavior.  Has considered getting a friend to sit in for her some amount of time supervising [name]'s safety.   Other stresses in the mix.  Hilario Quarry came down with a clever kind of COVID that masqueraded for days then turned out to be full-blown, taste and smell-stealing, tingling in fingers and toes syndrome.  Deescalated from her irritation with [name], eventually got heart to heart enough to find out that she was keeping quiet out of her own stress, not anything intentional.  Feels she has more come to terms with estrangement from her son and loss of relationship with grandchildren, whom she really did not know for long.  Can be provoked to rethink it hearing from a friend Butch Penny.    Notices herself walking quickly, acting urgently, and feeling like if she let herself cry, she'd never stop.  Validly sees it as combination of thyroiditis and all the layers above, and what amounts to PTSD (on top of preexisting PTSD) re. emergencies met detecting [name]'s septic infection and all that ensued, and the whole saga of his illness, her preexisting illness, and high index of  threat to both of them from the pandemic.  Has noticed two migraines this week, some aura even now, and frequent feelings of wanting to get great escape.    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    Therapeutic modalities: Cognitive Behavioral Therapy, Solution-Oriented/Positive Psychology, and Ego-Supportive  Mental Status/Observations:  Appearance:   Casual     Behavior:  Appropriate  Motor:  Normal  Speech/Language:   Clear and Coherent  Affect:  Appropriate  Mood:  depressed  Thought process:  normal  Thought content:    WNL  Sensory/Perceptual disturbances:    WNL  Orientation:  Fully oriented  Attention:  Good    Concentration:  Good  Memory:  WNL  Insight:    Good  Judgment:   Good  Impulse Control:  Good   Risk Assessment: Danger to Self: No Self-injurious Behavior: No Danger to Others: No Physical Aggression / Violence: No Duty to Warn: No Access to Firearms a concern: No  Assessment of progress:  progressing  Diagnosis:   ICD-10-CM   1. Generalized anxiety disorder  F41.1     2. Moderate episode of recurrent major depressive disorder (HCC)  F33.1     3. History of posttraumatic stress disorder (PTSD)  Z86.59     4. Caregiver stress  Z63.6     5. Mixed obsessional thoughts and acts  F42.2     6. 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 History of thyroiditis, possible recurrence  Z86.39      Plan:  Continue to pursue support options for caregiver support Continue to assess appetite, weight loss, and options for maintaining Look into potential home care services and coverage under Medicare for husband's situation as it may develop Self-affirm it's not always her problem when being others' listener, and  Other recommendations/advice as may be noted above Continue to utilize previously learned skills ad lib Maintain medication as prescribed and work faithfully with relevant prescriber(s) if any changes are desired or seem indicated Call the clinic on-call service, present to ER, or call 911 if any life-threatening psychiatric crisis Return for time at  discretion. Already scheduled visit in this office 03/16/2021.  [name], PhD [name], PhD LP Clinical Psychologist, [facility] [address], [place], [place] [address] [phone]

## 2021-03-04 DIAGNOSIS — M549 Dorsalgia, unspecified: Secondary | ICD-10-CM | POA: Insufficient documentation

## 2021-03-15 ENCOUNTER — Ambulatory Visit (INDEPENDENT_AMBULATORY_CARE_PROVIDER_SITE_OTHER): Payer: Medicare HMO | Admitting: Psychiatry

## 2021-03-15 DIAGNOSIS — Z636 Dependent relative needing care at home: Secondary | ICD-10-CM

## 2021-03-15 DIAGNOSIS — L4050 Arthropathic psoriasis, unspecified: Secondary | ICD-10-CM

## 2021-03-15 DIAGNOSIS — A310 Pulmonary mycobacterial infection: Secondary | ICD-10-CM

## 2021-03-15 DIAGNOSIS — F411 Generalized anxiety disorder: Secondary | ICD-10-CM | POA: Diagnosis not present

## 2021-03-15 DIAGNOSIS — Z8659 Personal history of other mental and behavioral disorders: Secondary | ICD-10-CM

## 2021-03-15 DIAGNOSIS — F331 Major depressive disorder, recurrent, moderate: Secondary | ICD-10-CM

## 2021-03-15 DIAGNOSIS — R69 Illness, unspecified: Secondary | ICD-10-CM

## 2021-03-15 DIAGNOSIS — F422 Mixed obsessional thoughts and acts: Secondary | ICD-10-CM
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
## 2021-03-16 ENCOUNTER — Ambulatory Visit: Payer: Medicare HMO | Admitting: Psychiatry

## 2021-03-24 ENCOUNTER — Telehealth: Payer: Self-pay | Admitting: Physician Assistant

## 2021-03-24 ENCOUNTER — Other Ambulatory Visit: Payer: Self-pay | Admitting: Physician Assistant

## 2021-03-24 NOTE — Telephone Encounter (Signed)
Pt called in for refill for Alprazplam 0.5mg . Appt 7/6. Ph: [phone]. Pharmacy [facility] [address] [place], [place].

## 2021-03-24 NOTE — Telephone Encounter (Signed)
Pended.

## 2021-03-24 NOTE — Telephone Encounter (Signed)
Last filled 6/6

## 2021-03-31 ENCOUNTER — Telehealth: Payer: Medicare HMO | Admitting: Physician Assistant

## 2021-04-02 ENCOUNTER — Telehealth: Payer: Medicare HMO | Admitting: Physician Assistant

## 2021-04-06 ENCOUNTER — Emergency Department (HOSPITAL_COMMUNITY)
Admission: EM | Admit: 2021-04-06 | Discharge: 2021-04-06 | Disposition: A | Discharge status: Home / Self Care | Payer: Medicare HMO | Attending: Emergency Medicine | Admitting: Emergency Medicine

## 2021-04-06 ENCOUNTER — Other Ambulatory Visit: Payer: Self-pay

## 2021-04-06 ENCOUNTER — Emergency Department (HOSPITAL_BASED_OUTPATIENT_CLINIC_OR_DEPARTMENT_OTHER)
Admission: EM | Admit: 2021-04-06 | Discharge: 2021-04-06 | Disposition: A | Discharge status: Home / Self Care | Payer: Medicare HMO | Attending: Emergency Medicine | Admitting: Emergency Medicine

## 2021-04-06 ENCOUNTER — Encounter (HOSPITAL_BASED_OUTPATIENT_CLINIC_OR_DEPARTMENT_OTHER): Payer: Self-pay | Admitting: Emergency Medicine

## 2021-04-06 ENCOUNTER — Emergency Department (HOSPITAL_BASED_OUTPATIENT_CLINIC_OR_DEPARTMENT_OTHER): Payer: Medicare HMO

## 2021-04-06 DIAGNOSIS — Z91040 Latex allergy status: Secondary | ICD-10-CM | POA: Insufficient documentation

## 2021-04-06 DIAGNOSIS — Z7982 Long term (current) use of aspirin: Secondary | ICD-10-CM | POA: Insufficient documentation

## 2021-04-06 DIAGNOSIS — Z87891 Personal history of nicotine dependence: Secondary | ICD-10-CM | POA: Insufficient documentation

## 2021-04-06 DIAGNOSIS — R109 Unspecified abdominal pain: Secondary | ICD-10-CM

## 2021-04-06 DIAGNOSIS — R1031 Right lower quadrant pain: Secondary | ICD-10-CM | POA: Insufficient documentation

## 2021-04-06 DIAGNOSIS — J45909 Unspecified asthma, uncomplicated: Secondary | ICD-10-CM | POA: Insufficient documentation

## 2021-04-06 DIAGNOSIS — Z5321 Procedure and treatment not carried out due to patient leaving prior to being seen by health care provider: Secondary | ICD-10-CM | POA: Diagnosis not present

## 2021-04-06 DIAGNOSIS — Z7951 Long term (current) use of inhaled steroids: Secondary | ICD-10-CM | POA: Insufficient documentation

## 2021-04-06 LAB — CBC WITH DIFFERENTIAL/PLATELET
Abs Immature Granulocytes: 0.02 10*3/uL (ref 0.00–0.07)
Basophils Absolute: 0.1 10*3/uL (ref 0.0–0.1)
Basophils Relative: 1 %
Eosinophils Absolute: 0.1 10*3/uL (ref 0.0–0.5)
Eosinophils Relative: 1 %
HCT: 50.3 % — ABNORMAL HIGH (ref 36.0–46.0)
Hemoglobin: 16 g/dL — ABNORMAL HIGH (ref 12.0–15.0)
Immature Granulocytes: 0 %
Lymphocytes Relative: 39 %
Lymphs Abs: 3.2 10*3/uL (ref 0.7–4.0)
MCH: 30.9 pg (ref 26.0–34.0)
MCHC: 31.8 g/dL (ref 30.0–36.0)
MCV: 97.1 fL (ref 80.0–100.0)
Monocytes Absolute: 0.6 10*3/uL (ref 0.1–1.0)
Monocytes Relative: 8 %
Neutro Abs: 4.3 10*3/uL (ref 1.7–7.7)
Neutrophils Relative %: 51 %
Platelets: 247 10*3/uL (ref 150–400)
RBC: 5.18 MIL/uL — ABNORMAL HIGH (ref 3.87–5.11)
RDW: 12.5 % (ref 11.5–15.5)
WBC: 8.3 10*3/uL (ref 4.0–10.5)
nRBC: 0 % (ref 0.0–0.2)

## 2021-04-06 LAB — BASIC METABOLIC PANEL
Anion gap: 12 (ref 5–15)
BUN: 7 mg/dL — ABNORMAL LOW (ref 8–23)
CO2: 21 mmol/L — ABNORMAL LOW (ref 22–32)
Calcium: 8.7 mg/dL — ABNORMAL LOW (ref 8.9–10.3)
Chloride: 103 mmol/L (ref 98–111)
Creatinine, Ser: 0.58 mg/dL (ref 0.44–1.00)
GFR, Estimated: >60 mL/min (ref >60)
Glucose, Bld: 84 mg/dL (ref 70–99)
Potassium: 4.2 mmol/L (ref 3.5–5.1)
Sodium: 136 mmol/L (ref 135–145)

## 2021-04-06 LAB — URINALYSIS, ROUTINE W REFLEX MICROSCOPIC
Bilirubin Urine: NEGATIVE
Glucose, UA: NEGATIVE mg/dL
Hgb urine dipstick: NEGATIVE
Ketones, ur: NEGATIVE mg/dL
Leukocytes,Ua: NEGATIVE
Nitrite: NEGATIVE
Protein, ur: NEGATIVE mg/dL
Specific Gravity, Urine: <1.005 — ABNORMAL LOW (ref 1.005–1.030)
pH: 6.5 (ref 5.0–8.0)

## 2021-04-06 MED ORDER — IOHEXOL 300 MG/ML  SOLN
100.0000 mL | Freq: Once | INTRAMUSCULAR | Status: AC | PRN
  Administered 2021-04-06: 100 mL via INTRAVENOUS

## 2021-04-06 MED ORDER — LACTATED RINGERS IV SOLN: INTRAVENOUS | Status: DC

## 2021-04-06 NOTE — ED Notes (Signed)
Patient transported to CT 

## 2021-04-06 NOTE — ED Provider Notes (Signed)
[facility] EMERGENCY DEPT Provider Note   CSN: [id number] Arrival date & time: 04/06/21  1802     History Chief Complaint  Patient presents with   Abdominal Pain    Sandra Burns is a 65 y.o. female.  65 year old female presents with right lower quad abdominal pain rating to her her right flank time several days.  Pain is worse in certain positions.  Denies hematuria or dysuria.  No fever or chills.  Had nausea but no vomiting.  Pain unrelieved with her medications.  Denies any prior history of same.  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/2019   Arrhythmia    Arthritis    Asthma    Back pain    Bronchiectasis ([place]) 09/04/2019   Chronic obstructive airway disease (HCC)    Diverticulosis    Fibromyalgia    Fibromyalgia    GERD (gastroesophageal reflux disease)    Irritable bowel syndrome    Kidney stone    Lupus (HCC)    "suspected"   Migraine    Mitral valve prolapse    Multiple allergies 09/04/2019   Mycobacterium avium complex ([place]) 12/25/2018   Osteoporosis    Paroxysmal A-fib (HCC)    Psoriatic arthritis (HCC)    Raynaud's disease    Reactive airway disease    S/P chemotherapy, time since greater than 12 weeks 1984   cervical ca   Vaccine counseling 12/09/2020    Patient Active Problem List   Diagnosis Date Noted   Vaccine counseling 12/09/2020   Bronchiectasis ([place]) 09/04/2019   Multiple allergies 09/04/2019   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   Mycobacterium avium complex ([place]) 12/25/2018   GAD (generalized anxiety disorder) 07/09/2018   Asthma 07/09/2018   Tricuspid valve prolapse 07/09/2018   Cardiac arrhythmia 07/09/2018   MVP (mitral valve prolapse) 07/09/2018   GERD (gastroesophageal reflux disease) 07/09/2018   Migraines 07/09/2018   PTSD (post-traumatic  stress disorder) 07/09/2018   IBS (irritable bowel syndrome) 07/09/2018   Psoriatic arthritis ([place]) 07/09/2018    Past Surgical History:  Procedure Laterality Date   ABDOMINAL HYSTERECTOMY     BACK SURGERY     BREAST BIOPSY Left 20+ YRS AGO   EXCISIONAL - NEG   CHOLECYSTECTOMY     OOPHORECTOMY       OB History   No obstetric history on file.     Family History  Problem Relation Age of Onset   Breast cancer Neg Hx     Social History   Tobacco Use   Smoking status: Former    Pack years: 0.00    Types: Cigarettes    Quit date: 07/13/1981    Years since quitting: 39.7   Smokeless tobacco: Never  Vaping Use   Vaping Use: Never used  Substance Use Topics   Alcohol use: Not Currently    Comment: drank as a teen.  Not since   Drug use: Not Currently    Comment: pot as a teen    Home Medications Prior to Admission medications   Medication Sig Start Date End Date Taking? Authorizing Provider  ALPRAZolam (XANAX) 0.5 MG tablet TAKE 1 & 1/2 TABS IN AM, 1 & 1/2 TABS AT LUNCH, 1 TAB AT DINNER, 1 & 1/2 TABS AT BEDTIME AS NEEDED 03/30/21   Addison Lank, PA-C  Artificial Tear Ointment (REFRESH P.M. OP) Place  1 application into both eyes at bedtime. Apply eye gel to both eyelids    [provider]  aspirin 81 MG chewable tablet Chew 81 mg by mouth daily.    [provider]  atenolol (TENORMIN) 25 MG tablet Take 12.5 mg by mouth 2 (two) times daily.     [provider]  budesonide (PULMICORT) 180 MCG/ACT inhaler Inhale 2 puffs into the lungs 2 (two) times daily.     [provider]  Cholecalciferol (VITAMIN D3) 10 MCG (400 UNIT) tablet Take by mouth.    [provider]  dicyclomine (BENTYL) 20 MG tablet Take 1 tablet (20 mg total) by mouth 2 (two) times daily. 11/09/17   Dorie Rank, MD  doxycycline (VIBRA-TABS) 100 MG tablet  08/02/19   [provider]  fluticasone (FLONASE) 50 MCG/ACT nasal spray Place 1 spray into both nostrils  daily as needed for allergies. 07/07/18   [provider]  ipratropium (ATROVENT HFA) 17 MCG/ACT inhaler Inhale 2 puffs into the lungs 4 (four) times daily.    [provider]  Multiple Vitamin (MULTIVITAMIN WITH MINERALS) TABS Take 1 tablet by mouth daily.    [provider]  mupirocin ointment (BACTROBAN) 2 % APPLY TO AFFECTED AREA 3 TIMES A DAY 08/23/19   [provider]  pantoprazole (PROTONIX) 40 MG tablet Take 40 mg by mouth daily as needed (GERD). For stomach    [provider]  Polyvinyl Alcohol-Povidone (REFRESH OP) Place 1 drop into both eyes daily as needed (dry eyes).    [provider]  ZIRGAN 0.15 % GEL APPLY 1 A SMALL AMOUNT IN AFFECTED EYE 5 TIMES A DAY UNTIL DIRECTED 06/10/19   [provider]    Allergies    Albuterol, Cephalosporins, Flagyl [metronidazole], Macrobid [nitrofurantoin macrocrystal], Sulfa antibiotics, Vancomycin, Vicodin [hydrocodone-acetaminophen], Amoxicillin, Ciprofloxacin, Bacitracin-neomycin-polymyxin, Tobramycin, Decongest-aid [pseudoephedrine], Eggshell membrane (chicken) [egg shells], Latex, Levofloxacin, Prednisone, and Zithromax [azithromycin]  Review of Systems   Review of Systems  All other systems reviewed and are negative.  Physical Exam Updated Vital Signs BP (!) 156/84 (BP Location: Right Arm)   Pulse 69   Temp 98.3 F (36.8 C) (Oral)   Resp 18   Ht 1.6 m (_0 )   Wt 49 kg   SpO2 100%   BMI 19.14 kg/m   Physical Exam Vitals and nursing note reviewed.  Constitutional:      General: She is not in acute distress.    Appearance: Normal appearance. She is well-developed. She is not toxic-appearing.  HENT:     Head: Normocephalic and atraumatic.  Eyes:     General: Lids are normal.     Conjunctiva/sclera: Conjunctivae normal.     Pupils: Pupils are equal, round, and reactive to light.  Neck:     Thyroid: No thyroid mass.     Trachea: No tracheal deviation.   Cardiovascular:     Rate and Rhythm: Normal rate and regular rhythm.     Heart sounds: Normal heart sounds. No murmur heard.   No gallop.  Pulmonary:     Effort: Pulmonary effort is normal. No respiratory distress.     Breath sounds: Normal breath sounds. No stridor. No decreased breath sounds, wheezing, rhonchi or rales.  Abdominal:     General: There is no distension.     Palpations: Abdomen is soft.     Tenderness: There is abdominal tenderness in the right lower quadrant. There is guarding. There is no rebound.  Musculoskeletal:  General: No tenderness. Normal range of motion.     Cervical back: Normal range of motion and neck supple.  Skin:    General: Skin is warm and dry.     Findings: No abrasion or rash.  Neurological:     Mental Status: She is alert and oriented to person, place, and time. Mental status is at baseline.     GCS: GCS eye subscore is 4. GCS verbal subscore is 5. GCS motor subscore is 6.     Cranial Nerves: Cranial nerves are intact. No cranial nerve deficit.     Sensory: No sensory deficit.     Motor: Motor function is intact.  Psychiatric:        Attention and Perception: Attention normal.        Speech: Speech normal.        Behavior: Behavior normal.    ED Results / Procedures / Treatments   Labs (all labs ordered are listed, but only abnormal results are displayed) Labs Reviewed - No data to display  EKG None  Radiology No results found.  Procedures Procedures   Medications Ordered in ED Medications  lactated ringers infusion (has no administration in time range)    ED Course  I have reviewed the triage vital signs and the nursing notes.  Pertinent labs & imaging results that were available during my care of the patient were reviewed by me and considered in my medical decision making (see chart for details).    MDM Rules/Calculators/A&P                          Abdominal CT without acute findings.  Labs are reassuring here.   Patient states that pain is worse with certain positions.  No evidence of an acute abdomen.  Will discharge home Final Clinical Impression(s) / ED Diagnoses Final diagnoses:  None    Rx / DC Orders ED Discharge Orders     None        Lacretia Leigh, MD 04/06/21 2035

## 2021-04-06 NOTE — ED Notes (Addendum)
Pt stated she was going to [facility] because she wasn't able to wait in this lobby any longer. Moving pt OTF.

## 2021-04-06 NOTE — ED Notes (Signed)
Patient refused temperature 

## 2021-04-06 NOTE — ED Triage Notes (Signed)
Pt arrives to ED with c/o RLQ and right flank pain x2 day. Pain is intermittent going from sharp to dull. No dysuria, hematuria, melena.

## 2021-04-08 LAB — URINE CULTURE: Culture: NO GROWTH

## 2021-04-13 ENCOUNTER — Ambulatory Visit (INDEPENDENT_AMBULATORY_CARE_PROVIDER_SITE_OTHER): Payer: Medicare HMO | Admitting: Psychiatry

## 2021-04-13 DIAGNOSIS — Z636 Dependent relative needing care at home: Secondary | ICD-10-CM | POA: Diagnosis not present

## 2021-04-13 DIAGNOSIS — Z8639 Personal history of other endocrine, nutritional and metabolic disease: Secondary | ICD-10-CM

## 2021-04-13 DIAGNOSIS — F422 Mixed obsessional thoughts and acts: Secondary | ICD-10-CM

## 2021-04-13 DIAGNOSIS — F331 Major depressive disorder, recurrent, moderate: Secondary | ICD-10-CM | POA: Diagnosis not present

## 2021-04-13 DIAGNOSIS — Z8659 Personal history of other mental and behavioral disorders: Secondary | ICD-10-CM

## 2021-04-13 DIAGNOSIS — R69 Illness, unspecified: Secondary | ICD-10-CM

## 2021-04-13 DIAGNOSIS — F411 Generalized anxiety disorder: Secondary | ICD-10-CM

## 2021-04-13 DIAGNOSIS — A310 Pulmonary mycobacterial infection: Secondary | ICD-10-CM

## 2021-04-13 DIAGNOSIS — L4050 Arthropathic psoriasis, unspecified: Secondary | ICD-10-CM

## 2021-04-13 NOTE — Progress Notes (Addendum)
Psychotherapy Progress Note [facility] [name], PhD LP  [name]     MRN: [id number] Therapy format: Individual psychotherapy Date: 04/13/2021      Start: 9:22a     Stop: 10:11a     Time Spent: 49 min Location: Telehealth visit -- I connected with this patient by an approved telecommunication method (video), with her informed consent, and verifying identity and patient privacy.  I was located at my office and patient at her home.  As needed, we discussed the limitations, risks, and security and privacy concerns associated with telehealth service, including the availability and conditions which currently govern in-person appointments and the possibility that 3rd-party payment may not be fully guaranteed and she may be responsible for charges.  After she indicated understanding, we proceeded with the session.  Also discussed treatment planning, as needed, including ongoing verbal agreement with the plan, the opportunity to ask and answer all questions, her demonstrated understanding of instructions, and her readiness to call the office should symptoms worsen or she feels she is in a crisis state and needs more immediate and tangible assistance.   Session narrative (presenting needs, interim history, self-report of stressors and symptoms, applications of prior therapy, status changes, and interventions made in session) Wearied again, had an ED trip for abdominal pain, previous visit for kidney stone, testing recently ruled out celiac disease, but she had a "p spike" result associated with lupus and some other AI diseases.  Will see Dr. Amil Amen with [facility].  Went through bad bout of painful joints, total body tendonitis, unable to bear weight on right hip suggestive of connective tissue disease, but also evidence of kidney stone at same time as IBS flareup and plausible appendicitis.  Had bad experience at [facility] ED, waiting 3 hours without triage and  seeing inadequate COVID risk procedures, got good advice to divert to the new [facility] at [facility], which went much more smoothly, albeit with a case of superficial phlebitis from sticking a problematic vein, and learning that she has worse atherosclerosis.  Afterward, developed sinus symptoms suspected of picking up a virus at [facility] ED, possibly COVID, called primary care and says she got pat advice that contradicted with his in-person advice for other things, including contraindications for decongestant, other matters.  C/o other matters at [facility] ED including CYA document for signing out [place] after triage and the observable burnout she saw in nurses' faces.  On the other hand, did feel a peace that comes with helplessness.  Says it's helped her to reflect on the double-edged sword of being very high in empathy, how "it's lonely at the top" of any valuable ability, and how it's helped her let go resentment.  Per Myers-Briggs types, she is INFJ, Donella Stade, full opposites.  He has variable mental status these days, still having breaches of agreed-upon sanitation protocol and jumping to conclusions about when not to "bother" her.  Disappointed in response to her inquiries with dementia caregiver support groups.  Now he's crabbing about having too much expected of him to remember.  Support provided.  Sleeping about 3:30-9am these days, going back to bed until about 1pm.  Still fighting to maintain 100 lb weight.  Thinking maybe she is fighting sleep, staying up to avoid the next thing being waking back up to hardships in daily living.  Affirmed as common and likely, encouraged in giving her next day the gift of better rest, consent to pay forward to self tomorrow.  Educated on light control as sleep aid, encouraged to try amber lenses.  Therapeutic modalities: Cognitive Behavioral Therapy, Solution-Oriented/Positive Psychology, and Ego-Supportive  Mental Status/Observations:  Appearance:   Casual      Behavior:  Appropriate  Motor:  Normal  Speech/Language:   Clear and Coherent  Affect:  Appropriate  Mood:  dysthymic  Thought process:  normal  Thought content:    WNL  Sensory/Perceptual disturbances:    WNL  Orientation:  Fully oriented  Attention:  Good    Concentration:  Good  Memory:  WNL  Insight:    Good  Judgment:   Good  Impulse Control:  Fair   Risk Assessment: Danger to Self: No Self-injurious Behavior: No Danger to Others: No Physical Aggression / Violence: No Duty to Warn: No Access to Firearms a concern: No  Assessment of progress:  stabilized  Diagnosis:   ICD-10-CM   1. Generalized anxiety disorder  F41.1     2. Moderate episode of recurrent major depressive disorder (HCC)  F33.1     3. History of posttraumatic stress disorder (PTSD)  Z86.59     4. Caregiver stress  Z63.6     5. 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 Psoriatic arthritis ([place])  L40.50     7. r/o Lupus (SLE) vs. unspecified complex AI disorder  R69     8. Mixed obsessional thoughts and acts  F42.2     9. History of thyroiditis, possible recurrence  Z86.39      Plan:  Try to improve calorie content, vitamin D Seek safe outings to drive down sense of confinement Trim back bedtime, consenting to let the day end and give tomorrow the gift of rest Try amber lenses to induce sleep readiness Allow that some things are going to be too hard still, and police tendency to resent Other recommendations/advice as may be noted above Continue to utilize previously learned skills ad lib Maintain medication as prescribed and work faithfully with relevant prescriber(s) if any changes are desired or seem indicated Call the clinic on-call service, present to ER, or call 911 if any life-threatening psychiatric crisis Return for time as available. Already scheduled visit in this office 04/26/2021.  [name], PhD [name], PhD LP Clinical Psychologist, [facility]  Group [facility] [address] [place], [place] [address] [phone]

## 2021-04-26 ENCOUNTER — Telehealth (INDEPENDENT_AMBULATORY_CARE_PROVIDER_SITE_OTHER): Payer: Medicare HMO | Admitting: Physician Assistant

## 2021-04-26 ENCOUNTER — Ambulatory Visit: Payer: Medicare HMO | Admitting: Physician Assistant

## 2021-04-26 ENCOUNTER — Encounter: Payer: Self-pay | Admitting: Physician Assistant

## 2021-04-26 DIAGNOSIS — F4323 Adjustment disorder with mixed anxiety and depressed mood: Secondary | ICD-10-CM

## 2021-04-26 DIAGNOSIS — F411 Generalized anxiety disorder: Secondary | ICD-10-CM

## 2021-04-26 DIAGNOSIS — Z636 Dependent relative needing care at home: Secondary | ICD-10-CM

## 2021-04-26 MED ORDER — ALPRAZOLAM 0.5 MG PO TABS: ORAL_TABLET | ORAL | 5 refills | Status: DC

## 2021-04-26 NOTE — Progress Notes (Signed)
[facility]  [name],  MRN: [id number]  PCP: Barbette Reichmann, MD  Date of Evaluation: 04/26/2021 Time spent:20 minutes  Chief Complaint:  Chief Complaint   Anxiety; Depression     Virtual Visit via Telehealth  I connected with patient by a video enabled telemedicine application with their informed consent, and verified patient privacy and that I am speaking with the correct person using two identifiers.  I am private, in my office and the patient is at home.  I discussed the limitations, risks, security and privacy concerns of performing an evaluation and management service by video and the availability of in person appointments. I also discussed with the patient that there may be a patient responsible charge related to this service. The patient expressed understanding and agreed to proceed.   I discussed the assessment and treatment plan with the patient. The patient was provided an opportunity to ask questions and all were answered. The patient agreed with the plan and demonstrated an understanding of the instructions.   The patient was advised to call back or seek an in-person evaluation if the symptoms worsen or if the condition fails to improve as anticipated.  I provided 20 minutes of non-face-to-face time during this encounter.  HISTORY/CURRENT STATUS: For routine med check.  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 He's been dx with 2 small strokes, unsure how old but there's patchy areas of the white matter of the brain. Dementia is worse.   Anxiety is controlled with the Xanax.  There has been a few rare occasions when she missed the midday dose of Xanax and she did not have an uptick of anxiety on those days.  She would like to keep weaning down and someday off of the Xanax.  But right now the Xanax is helping control the situational anxiety.  She sleeps pretty good, only about 6 hours but that is her  norm.  States she does get sad sometimes but feels that it is circumstantial.  She reminds herself that anyone would be sad and get depressed over what ever situation she is in at the moment.  The feelings of hopelessness go away in 2 to 3 days usually.  She still stays at home unless absolutely necessary to go to a doctor's appointment.  So the isolating is only because of the COVID pandemic and her being extra careful to not get sick.  Does not cry easily.  Denies suicidal or homicidal thoughts.  She's still in counseling with Dr. [name] which is very helpful.  She has also joined a couple of online support groups where people who are caring for her loved ones with dementia can support each other.  Patient denies increased energy with decreased need for sleep, no increased talkativeness, no racing thoughts, no impulsivity or risky behaviors, no increased spending, no increased libido, no grandiosity, no paranoia, no AH/VH.  Denies dizziness, syncope, seizures, numbness, tingling, tremor, tics, unsteady gait, slurred speech, confusion. Denies muscle or joint pain, stiffness, or dystonia.  Individual Medical History/ Review of Systems: Changes? :Yes   having stomach problems, will have endoscopy soon.  Past medications for mental health diagnoses include: Zoloft, caused psychosis w/ hallucinations,  BuSpar, Ativan, Effexor (became psychotic) She was told to never take any kind of SSRI/SNRI again.  Allergies: Albuterol, Cephalosporins, Flagyl [metronidazole], Macrobid [nitrofurantoin macrocrystal], Sulfa antibiotics, Vancomycin, Vicodin [hydrocodone-acetaminophen], Amoxicillin, Ciprofloxacin, Bacitracin-neomycin-polymyxin, Tobramycin, Decongest-aid [pseudoephedrine], Eggshell membrane (chicken) [egg shells], Latex, Levofloxacin, Prednisone, and Zithromax [  azithromycin]  Current Medications:  Current Outpatient Medications:    Artificial Tear Ointment (REFRESH P.M. OP), Place 1 application  into both eyes at bedtime. Apply eye gel to both eyelids, Disp: , Rfl:    aspirin 81 MG chewable tablet, Chew 81 mg by mouth daily., Disp: , Rfl:    atenolol (TENORMIN) 25 MG tablet, Take 12.5 mg by mouth 2 (two) times daily. , Disp: , Rfl:    budesonide (PULMICORT) 180 MCG/ACT inhaler, Inhale 2 puffs into the lungs 2 (two) times daily. , Disp: , Rfl:    Cholecalciferol (VITAMIN D3) 10 MCG (400 UNIT) tablet, Take by mouth., Disp: , Rfl:    dicyclomine (BENTYL) 20 MG tablet, Take 1 tablet (20 mg total) by mouth 2 (two) times daily., Disp: 20 tablet, Rfl: 0   doxycycline (VIBRA-TABS) 100 MG tablet, , Disp: , Rfl:    fluticasone (FLONASE) 50 MCG/ACT nasal spray, Place 1 spray into both nostrils daily as needed for allergies., Disp: , Rfl: 6   ipratropium (ATROVENT HFA) 17 MCG/ACT inhaler, Inhale 2 puffs into the lungs 4 (four) times daily., Disp: , Rfl:    Multiple Vitamin (MULTIVITAMIN WITH MINERALS) TABS, Take 1 tablet by mouth daily., Disp: , Rfl:    mupirocin ointment (BACTROBAN) 2 %, APPLY TO AFFECTED AREA 3 TIMES A DAY, Disp: , Rfl:    pantoprazole (PROTONIX) 40 MG tablet, Take 40 mg by mouth daily as needed (GERD). For stomach, Disp: , Rfl:    Polyvinyl Alcohol-Povidone (REFRESH OP), Place 1 drop into both eyes daily as needed (dry eyes)., Disp: , Rfl:    ZIRGAN 0.15 % GEL, APPLY 1 A SMALL AMOUNT IN AFFECTED EYE 5 TIMES A DAY UNTIL DIRECTED, Disp: , Rfl:    ALPRAZolam (XANAX) 0.5 MG tablet, 1.5 pills in morning, 1.5 pills at lunch, 1 at dinner, and 1.5 pills at bedtime, all prn., Disp: 165 tablet, Rfl: 5 Medication Side Effects: none  Family Medical/ Social History: Changes? No  MENTAL HEALTH EXAM:  There were no vitals taken for this visit.There is no height or weight on file to calculate BMI.  General Appearance: Casual, Neat and Well Groomed  Eye Contact:  Good  Speech:  Clear and Coherent and Normal Rate  Volume:  Normal  Mood:  Euthymic  Affect:  Appropriate  Thought Process:   Goal Directed and Descriptions of Associations: Intact  Orientation:  Full (Time, Place, and Person)  Thought Content: Logical   Suicidal Thoughts:  No  Homicidal Thoughts:  No  Memory:  WNL  Judgement:  Good  Insight:  Good  Psychomotor Activity:  Normal  Concentration:  Concentration: Good and Attention Span: Good  Recall:  Good  Fund of Knowledge: Good  Language: Good  Assets:  Desire for Improvement  ADL's:  Intact  Cognition: WNL  Prognosis:  Good    DIAGNOSES:    ICD-10-CM   1. Generalized anxiety disorder  F41.1     2. Caregiver stress  Z63.6     3. Adjustment disorder with mixed anxiety and depressed mood  F43.23        Receiving Psychotherapy: Yes w/ Dr. [name]   RECOMMENDATIONS:  PDMP reviewed.  Last Xanax was filled 03/30/2021 I provided 30 minutes of non-face-to-face time during this encounter, including time spent before and after the visit in records review, medical decision making, and charting.  If she does have depressive symptoms that last over a week she will call me.  Right now if she does  feel depressed it is because of circumstances and she is super sad.  After 2 or 3 days she feels better. Recommend leaving Xanax the same. If she wants to try going down to 1 pill only at lunch, instead of 1.5 pills. Continue Xanax 0.5 mg, 1.5 pills every morning, 1.5 pill at lunch, 1 pill at dinner, 1.5 at bedtime as needed. Continue psychotherapy with Dr. [name].  Return in 6 months.  Melony Overly, PA-C

## 2021-04-27 ENCOUNTER — Ambulatory Visit (INDEPENDENT_AMBULATORY_CARE_PROVIDER_SITE_OTHER): Payer: Medicare HMO | Admitting: Psychiatry

## 2021-04-27 DIAGNOSIS — G4700 Insomnia, unspecified: Secondary | ICD-10-CM

## 2021-04-27 DIAGNOSIS — Z636 Dependent relative needing care at home: Secondary | ICD-10-CM

## 2021-04-27 DIAGNOSIS — F331 Major depressive disorder, recurrent, moderate: Secondary | ICD-10-CM | POA: Diagnosis not present

## 2021-04-27 DIAGNOSIS — F411 Generalized anxiety disorder: Secondary | ICD-10-CM | POA: Diagnosis not present

## 2021-04-27 DIAGNOSIS — Z8639 Personal history of other endocrine, nutritional and metabolic disease: Secondary | ICD-10-CM

## 2021-04-27 DIAGNOSIS — F422 Mixed obsessional thoughts and acts: Secondary | ICD-10-CM

## 2021-04-27 DIAGNOSIS — H579 Unspecified disorder of eye and adnexa: Secondary | ICD-10-CM

## 2021-04-27 DIAGNOSIS — A310 Pulmonary mycobacterial infection: Secondary | ICD-10-CM

## 2021-04-27 DIAGNOSIS — Z8659 Personal history of other mental and behavioral disorders: Secondary | ICD-10-CM

## 2021-04-27 DIAGNOSIS — R69 Illness, unspecified: Secondary | ICD-10-CM

## 2021-04-27 DIAGNOSIS — L4050 Arthropathic psoriasis, unspecified: Secondary | ICD-10-CM
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
## 2021-05-11 ENCOUNTER — Ambulatory Visit: Payer: Medicare HMO

## 2021-05-11 ENCOUNTER — Ambulatory Visit (INDEPENDENT_AMBULATORY_CARE_PROVIDER_SITE_OTHER): Payer: Medicare HMO | Admitting: Psychiatry

## 2021-05-11 DIAGNOSIS — L4050 Arthropathic psoriasis, unspecified: Secondary | ICD-10-CM

## 2021-05-11 DIAGNOSIS — F422 Mixed obsessional thoughts and acts: Secondary | ICD-10-CM

## 2021-05-11 DIAGNOSIS — F411 Generalized anxiety disorder: Secondary | ICD-10-CM

## 2021-05-11 DIAGNOSIS — F331 Major depressive disorder, recurrent, moderate: Secondary | ICD-10-CM

## 2021-05-11 DIAGNOSIS — Z8639 Personal history of other endocrine, nutritional and metabolic disease: Secondary | ICD-10-CM

## 2021-05-11 DIAGNOSIS — E638 Other specified nutritional deficiencies: Secondary | ICD-10-CM | POA: Diagnosis not present

## 2021-05-11 DIAGNOSIS — I700 Atherosclerosis of aorta: Secondary | ICD-10-CM

## 2021-05-11 DIAGNOSIS — G4700 Insomnia, unspecified: Secondary | ICD-10-CM

## 2021-05-11 DIAGNOSIS — Z8659 Personal history of other mental and behavioral disorders: Secondary | ICD-10-CM

## 2021-05-11 DIAGNOSIS — A310 Pulmonary mycobacterial infection: Secondary | ICD-10-CM

## 2021-05-11 DIAGNOSIS — H579 Unspecified disorder of eye and adnexa: Secondary | ICD-10-CM

## 2021-05-11 DIAGNOSIS — R69 Illness, unspecified: Secondary | ICD-10-CM

## 2021-05-11 DIAGNOSIS — Z636 Dependent relative needing care at home: Secondary | ICD-10-CM
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
## 2021-05-18 ENCOUNTER — Ambulatory Visit: Payer: Medicare HMO

## 2021-05-25 ENCOUNTER — Ambulatory Visit (INDEPENDENT_AMBULATORY_CARE_PROVIDER_SITE_OTHER): Payer: Medicare HMO | Admitting: Psychiatry

## 2021-05-25 DIAGNOSIS — F331 Major depressive disorder, recurrent, moderate: Secondary | ICD-10-CM

## 2021-05-25 DIAGNOSIS — Z8659 Personal history of other mental and behavioral disorders: Secondary | ICD-10-CM | POA: Diagnosis not present

## 2021-05-25 DIAGNOSIS — F411 Generalized anxiety disorder: Secondary | ICD-10-CM | POA: Diagnosis not present

## 2021-05-25 DIAGNOSIS — A310 Pulmonary mycobacterial infection: Secondary | ICD-10-CM

## 2021-05-25 DIAGNOSIS — F422 Mixed obsessional thoughts and acts: Secondary | ICD-10-CM

## 2021-05-25 DIAGNOSIS — L4050 Arthropathic psoriasis, unspecified: Secondary | ICD-10-CM

## 2021-05-25 DIAGNOSIS — R69 Illness, unspecified: Secondary | ICD-10-CM

## 2021-05-25 NOTE — Progress Notes (Signed)
Psychotherapy Progress Note [facility] [name], PhD LP  [name]     MRN: [id number] Therapy format: Individual psychotherapy Date: 05/25/2021      Start: 3:23p     Stop: 4:13p     Time Spent: 50 min Location: Telehealth visit -- I connected with this patient by an approved telecommunication method (video), with her informed consent, and verifying identity and patient privacy.  I was located at my office and patient at her home.  As needed, we discussed the limitations, risks, and security and privacy concerns associated with telehealth service, including the availability and conditions which currently govern in-person appointments and the possibility that 3rd-party payment may not be fully guaranteed and she may be responsible for charges.  After she indicated understanding, we proceeded with the session.  Also discussed treatment planning, as needed, including ongoing verbal agreement with the plan, the opportunity to ask and answer all questions, her demonstrated understanding of instructions, and her readiness to call the office should symptoms worsen or she feels she is in a crisis state and needs more immediate and tangible assistance.   Session narrative (presenting needs, interim history, self-report of stressors and symptoms, applications of prior therapy, status changes, and interventions made in session) Sandra Burns (a now-lapsed fellow patient) finally caught COVID and has postviral brain fog, partly evidenced by her being uncharacteristically frank and sending her a card -- 2 days into her COVID, having licked the envelope, complicating things for Sandra Burns's high need for sanitation.  Not prone to react to her, just struggles with feeling overlooked.    Continues to struggle with weight, which may be about racing metabolism under stress, some about food phobias that established early in the pandemic.  Considering 4th COVID shot or wait for  the reformulation.    Sandra Burns has been more trying lately, picking on her again, though he does mask and goggle both when going to pharmacy for her.  "Traumatic" event encountering a large roach and not being able to kill it, worry if it's possible a queen.  Childhood memories of seeing 38 of them in the kitchen (mother was a "shitty" housekeeper), finding them in the silverware drawer, and being too embarrassed about her home to have kids over, all of which drive her to guard against ever having to do that again.  Encouraged in use of appropriate insect control methods and trust that waterbugs do not normally try to set up indoors in dry spaces, she can trust her observation that it was probably already poisoned and beginning to disorient, and she has already achieved her goal of never again living in a roach-infested home.  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  Down to 97 lbs some days, partly for depression's sake, partly for being afraid of letting contagion into her body.  Has ordered Ensure Plus to help restore fat.  Aware she has OCD on top of it, with intrusive thoughts whether she has done enough to sanitize.  Appetite itself inhibited ever since complicated cholecystectomy in April 26, 2015.  Is beginning to break through, telling herself "screw it" and going into her food supply at 2 days instead of 3.  Affirmed and encouraged.  Therapeutic modalities: Cognitive Behavioral Therapy, Solution-Oriented/Positive Psychology, and Ego-Supportive  Mental Status/Observations:  Appearance:   Casual     Behavior:  Appropriate  Motor:  Normal  Speech/Language:   Clear and Coherent  Affect:  Appropriate  Mood:  anxious and dysthymic  Thought process:  normal  Thought content:    Obsessions  Sensory/Perceptual disturbances:    WNL  Orientation:  Fully oriented  Attention:  Good    Concentration:  Good  Memory:  WNL  Insight:    Good   Judgment:   Good  Impulse Control:  Good   Risk Assessment: Danger to Self: No Self-injurious Behavior: No Danger to Others: No Physical Aggression / Violence: No Duty to Warn: No Access to Firearms a concern: No  Assessment of progress:  progressing  Diagnosis:   ICD-10-CM   1. Generalized anxiety disorder  F41.1     2. Moderate episode of recurrent major depressive disorder (HCC)  F33.1     3. Mixed obsessional thoughts and acts  F42.2     4. History of posttraumatic stress disorder (PTSD)  Z86.59     5. 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 Psoriatic arthritis ([place])  L40.50     7. r/o Lupus (SLE) vs. unspecified complex AI disorder  R69      Plan:  Employ patience strategies with Sandra Burns as able Make use of more calorie-dense foods such as Ensure Plus Seek and declare "good enough" when it comes to decontamination procedures and specifically resist taking up any second methods or repeat cleanings  Continue to use expressive outlets like poetry and handcrafts Other recommendations/advice as may be noted above Continue to utilize previously learned skills ad lib Maintain medication as prescribed and work faithfully with relevant prescriber(s) if any changes are desired or seem indicated Call the clinic on-call service, present to ER, or call 911 if any life-threatening psychiatric crisis Return for time as available. Already scheduled visit in this office 06/01/2021.  [name], PhD [name], PhD LP Clinical Psychologist, [facility] [address] [place], [place] [address] [phone]

## 2021-06-01 ENCOUNTER — Ambulatory Visit (INDEPENDENT_AMBULATORY_CARE_PROVIDER_SITE_OTHER): Payer: Medicare HMO | Admitting: Psychiatry

## 2021-06-01 DIAGNOSIS — F411 Generalized anxiety disorder: Secondary | ICD-10-CM

## 2021-06-01 DIAGNOSIS — E638 Other specified nutritional deficiencies: Secondary | ICD-10-CM

## 2021-06-01 DIAGNOSIS — Z636 Dependent relative needing care at home: Secondary | ICD-10-CM

## 2021-06-01 DIAGNOSIS — Z634 Disappearance and death of family member: Secondary | ICD-10-CM | POA: Diagnosis not present

## 2021-06-01 DIAGNOSIS — Z8639 Personal history of other endocrine, nutritional and metabolic disease: Secondary | ICD-10-CM

## 2021-06-01 DIAGNOSIS — F331 Major depressive disorder, recurrent, moderate: Secondary | ICD-10-CM

## 2021-06-01 DIAGNOSIS — A310 Pulmonary mycobacterial infection: Secondary | ICD-10-CM

## 2021-06-01 DIAGNOSIS — G4700 Insomnia, unspecified: Secondary | ICD-10-CM

## 2021-06-01 DIAGNOSIS — Z8659 Personal history of other mental and behavioral disorders: Secondary | ICD-10-CM

## 2021-06-01 DIAGNOSIS — L4050 Arthropathic psoriasis, unspecified: Secondary | ICD-10-CM

## 2021-06-01 DIAGNOSIS — R69 Illness, unspecified: Secondary | ICD-10-CM

## 2021-06-01 DIAGNOSIS — Z630 Problems in relationship with spouse or partner: Secondary | ICD-10-CM

## 2021-06-01 DIAGNOSIS — F422 Mixed obsessional thoughts and acts: Secondary | ICD-10-CM
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
## 2021-06-15 ENCOUNTER — Ambulatory Visit: Payer: Medicare HMO | Attending: Internal Medicine

## 2021-06-15 ENCOUNTER — Ambulatory Visit (INDEPENDENT_AMBULATORY_CARE_PROVIDER_SITE_OTHER): Payer: Medicare HMO | Admitting: Psychiatry

## 2021-06-15 ENCOUNTER — Other Ambulatory Visit: Payer: Self-pay

## 2021-06-15 DIAGNOSIS — F331 Major depressive disorder, recurrent, moderate: Secondary | ICD-10-CM | POA: Diagnosis not present

## 2021-06-15 DIAGNOSIS — Z636 Dependent relative needing care at home: Secondary | ICD-10-CM

## 2021-06-15 DIAGNOSIS — F411 Generalized anxiety disorder: Secondary | ICD-10-CM

## 2021-06-15 DIAGNOSIS — Z8659 Personal history of other mental and behavioral disorders: Secondary | ICD-10-CM

## 2021-06-15 DIAGNOSIS — Z630 Problems in relationship with spouse or partner: Secondary | ICD-10-CM

## 2021-06-15 DIAGNOSIS — F422 Mixed obsessional thoughts and acts: Secondary | ICD-10-CM | POA: Diagnosis not present

## 2021-06-15 DIAGNOSIS — F40298 Other specified phobia: Secondary | ICD-10-CM

## 2021-06-15 DIAGNOSIS — Z23 Encounter for immunization: Secondary | ICD-10-CM

## 2021-06-15 DIAGNOSIS — Z638 Other specified problems related to primary support group: Secondary | ICD-10-CM

## 2021-06-15 MED ORDER — PFIZER COVID-19 VAC BIVALENT 30 MCG/0.3ML IM SUSP
INTRAMUSCULAR | 0 refills | Status: AC
  Filled 2021-06-15: qty 0.3, 1d supply, fill #0

## 2021-06-15 NOTE — Progress Notes (Signed)
   Covid-19 Vaccination Clinic  Name:  Sandra Burns    MRN: [id number] DOB: [birth year]  06/15/2021  Ms. Sandra Burns was observed post Covid-19 immunization for 15 minutes without incident. She was provided with Vaccine Information Sheet and instruction to access the V-Safe system.   Ms. Sandra Burns was instructed to call 911 with any severe reactions post vaccine: Difficulty breathing  Swelling of face and throat  A fast heartbeat  A bad rash all over body  Dizziness and weakness   Drusilla Kanner, PharmD, MBA Clinical Acute Care Pharmacist

## 2021-06-15 NOTE — Progress Notes (Signed)
Psychotherapy Progress Note [facility] Sandra [name], PhD LP  [name]     MRN: [id number] Therapy format: Individual psychotherapy Date: 06/15/2021      Start: 11:15a     Stop: 12:15p     Time Spent: 45 min (remainder donated)  Location: Telehealth visit -- I connected with this patient by an approved telecommunication method (video), with her informed consent, and verifying identity and patient privacy.  I was located at my office and patient at her home.  As needed, we discussed the limitations, risks, and security and privacy concerns associated with telehealth service, including the availability and conditions which currently govern in-person appointments and the possibility that 3rd-party payment may not be fully guaranteed and she may be responsible for charges.  After she indicated understanding, we proceeded with the session.  Also discussed treatment planning, as needed, including ongoing verbal agreement with the plan, the opportunity to ask and answer all questions, her demonstrated understanding of instructions, and her readiness to call the office should symptoms worsen or she feels she is in a crisis state and needs more immediate and tangible assistance.   Session narrative (presenting needs, interim history, self-report of stressors and symptoms, applications of prior therapy, status changes, and interventions made in session) Up early today, got updated [employer].  Optimistic about her immunity and better safety going out, some hope of being .  Has been able to little things like pick her own produce at the grocery, visit friends outside.  Irritated with the president for declaring the pandemic over when we still have hundreds of hospitalizations and deaths daily.  Two new cases among her friends, in fact, and two friends who lost spouses to the disease.    Beading and other art work for recreation.  Getting some positive feedback on items  shared out on social media.  Seeking options to combat depression.  Has been pushing eating, doing "reverse calorie counting" and internally rewarding herself for improving rather than scolding herself for failing.  Maintaining over 100 lbs since last spoken.  Interested to find out about the Sandra Burns, looks healthful, hopeful if she ever needs, and reassuring to know it exists rather than just the "don't give a shit" ER.  Joined a Burns, [employer], emphasizing communing with nature.  Pleasant exercise to go through the alphabet finding words that are positive/affirming, found it helpful to try last night.    Notes she has had a number of warts break out on her hands last few months with Sandra Burns's cognitive illness.  Zeroed in on how his worst mental status has been lonely-making, and when he takes 3-day episodes of dementia-like to correct Sandra Burns that her main stressor is not COVID, it's how he behaves.  Seeing him behave better since acknowledging.  Worry for brother Sandra Burns, who is hypochondriacal but calls her instead of going to a physician, has two estranged kids of his own, and now a neuropathy problem in his foot he is transmuting into his family story of diabetic troubles and amputation.  Preparing how to support him, inclined to coach him to be frank with his wife rather than hide his concerns.  Discussed messaging to manage her fear of sounding like she's blowing him off if she says that.  Knows she has her own experience to share of learning to stop speculating and drive herself mad (re estranged son Sandra Burns) and testimonials to give about taking those thoughts captive, taking care of herself,  refusing panic.  And good news about the roach -- Sandra Burns pointed it out, sitting on the sink, so she had to see it, then Sandra Burns grabbed and flushed it.  Still, her OCD considers at night what all it could have walked on and she is battling temptation to disinfect and replace everything in the kitchen but  believes she can settle or just washing normally.  Affirmed and encouraged.  Therapeutic modalities: Cognitive Behavioral Therapy, Solution-Oriented/Positive Psychology, and Ego-Supportive  Mental Status/Observations:  Appearance:   Casual     Behavior:  Appropriate  Motor:  Normal  Speech/Language:   Clear and Coherent  Affect:  Appropriate  Mood:  anxious and better energy  Thought process:  normal  Thought content:    Obsessions  Sensory/Perceptual disturbances:    WNL  Orientation:  Fully oriented  Attention:  Good    Concentration:  Good  Memory:  WNL  Insight:    Good  Judgment:   Good  Impulse Control:  Good   Risk Assessment: Danger to Self: No Self-injurious Behavior: No Danger to Others: No Physical Aggression / Violence: No Duty to Warn: No Access to Firearms a concern: No  Assessment of progress:  progressing  Diagnosis:   ICD-10-CM   1. Moderate episode of recurrent major depressive disorder (HCC)  F33.1     2. 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 Mixed obsessional thoughts and acts  F42.2     4. History of posttraumatic stress disorder (PTSD)  Z86.59     5. Caregiver stress  Z63.6     6. Relationship problem between partners  Z63.0     7. Relationship problem with family member (estrangment with son)  Z63.8     8. 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      Plan:  Option write to the Sandra Burns for feedback if moved Pursue art/craft creative outlets as moved Sandra Burns techniques from new support organization Continue resisting excessive phobic responses wherever possible, including roach issue Recommend testimonial approach to brother, ask consent to listen before going into her message Other recommendations/advice as may be noted above Continue to utilize previously learned skills ad lib Maintain medication as prescribed and work faithfully with relevant prescriber(s) if any changes are desired or seem indicated Call  the clinic on-call service, 988/hotline, present to ER, or call 911 if any life-threatening psychiatric crisis Return for will call, put on cancellation list, time as available. Already scheduled visit in this office 09/22/2021.  [name] Fries, PhD Sandra [name], PhD LP Clinical Psychologist, [facility] [address] [place], [place] [address] [phone]

## 2021-07-01 ENCOUNTER — Telehealth (INDEPENDENT_AMBULATORY_CARE_PROVIDER_SITE_OTHER): Payer: Medicare HMO | Admitting: Psychiatry

## 2021-07-01 DIAGNOSIS — H579 Unspecified disorder of eye and adnexa: Secondary | ICD-10-CM

## 2021-07-01 DIAGNOSIS — Z636 Dependent relative needing care at home: Secondary | ICD-10-CM

## 2021-07-01 DIAGNOSIS — F5089 Other specified eating disorder: Secondary | ICD-10-CM

## 2021-07-01 DIAGNOSIS — F411 Generalized anxiety disorder: Secondary | ICD-10-CM

## 2021-07-01 DIAGNOSIS — F422 Mixed obsessional thoughts and acts: Secondary | ICD-10-CM | POA: Diagnosis not present

## 2021-07-01 DIAGNOSIS — I499 Cardiac arrhythmia, unspecified: Secondary | ICD-10-CM

## 2021-07-01 DIAGNOSIS — Z8659 Personal history of other mental and behavioral disorders: Secondary | ICD-10-CM

## 2021-07-01 DIAGNOSIS — R69 Illness, unspecified: Secondary | ICD-10-CM
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
## 2021-07-13 ENCOUNTER — Telehealth: Payer: Self-pay | Admitting: Psychiatry

## 2021-07-13 NOTE — Telephone Encounter (Signed)
Pt called back and said that you don't need to call her . She will talk to you on Thursday at her appointment

## 2021-07-13 NOTE — Telephone Encounter (Signed)
Patient called in today very tearful. States that she is really having a hard time right now and would like to speak with someone. She states she is feeling very overwhelmed would like a rtc today if possible. She also states that husband has dementia and that he is always wants to argue. Pls rtc [phone].

## 2021-07-15 ENCOUNTER — Telehealth: Payer: Medicare HMO | Admitting: Psychiatry

## 2021-07-16 ENCOUNTER — Telehealth (INDEPENDENT_AMBULATORY_CARE_PROVIDER_SITE_OTHER): Payer: Medicare HMO | Admitting: Psychiatry

## 2021-07-16 DIAGNOSIS — R69 Illness, unspecified: Secondary | ICD-10-CM

## 2021-07-16 DIAGNOSIS — Z8659 Personal history of other mental and behavioral disorders: Secondary | ICD-10-CM

## 2021-07-16 DIAGNOSIS — L4050 Arthropathic psoriasis, unspecified: Secondary | ICD-10-CM

## 2021-07-16 DIAGNOSIS — F411 Generalized anxiety disorder: Secondary | ICD-10-CM | POA: Diagnosis not present

## 2021-07-16 DIAGNOSIS — F331 Major depressive disorder, recurrent, moderate: Secondary | ICD-10-CM

## 2021-07-16 DIAGNOSIS — F5089 Other specified eating disorder: Secondary | ICD-10-CM

## 2021-07-16 DIAGNOSIS — Z636 Dependent relative needing care at home: Secondary | ICD-10-CM | POA: Diagnosis not present

## 2021-07-16 NOTE — Progress Notes (Signed)
Psychotherapy Progress Note [facility] [name], PhD LP  Patient ID: Miraya Cudney)    MRN: [id number] Therapy format: Individual psychotherapy Date: 07/16/2021      Start: 1:08p     Stop: 1:58p     Time Spent: 50 min Location: Telehealth visit -- I connected with this patient by an approved telecommunication method (video), with her informed consent, and verifying identity and patient privacy.  I was located at my office and patient at her home.  As needed, we discussed the limitations, risks, and security and privacy concerns associated with telehealth service, including the availability and conditions which currently govern in-person appointments and the possibility that 3rd-party payment may not be fully guaranteed and she may be responsible for charges.  After she indicated understanding, we proceeded with the session.  Also discussed treatment planning, as needed, including ongoing verbal agreement with the plan, the opportunity to ask and answer all questions, her demonstrated understanding of instructions, and her readiness to call the office should symptoms worsen or she feels she is in a crisis state and needs more immediate and tangible assistance.   Session narrative (presenting needs, interim history, self-report of stressors and symptoms, applications of prior therapy, status changes, and interventions made in session) Semi-urgent scheduling.  The beach trip with [name] was pretty disappointing -- he was childlike the whole time, she worked all the time, and [name] wouldn't take off his mask even outside, go to [employer].  Either argues or approaches her as "mommy", which was annoying and lonely.  Does understand [name] has vascular dementia now and it means he will be in and out of reason, and emotion-driven thinking.  Got some helpful information about vascular dementia from social worker, trying to discern what level of help to get.   HSV  flaring up again, and unable to tolerate antiviral.  Injured herself getting out of an Randall chair (believes burst a vein, large hematoma on hamstring).  In back pain today from weather change.  Suspecting lupus vasculitis now -- can burst vessels just drying off her feet after a shower.  Weight down to 97 lbs, pushed it to see cardiologist, who is alarmed enough.  Saw primary care, labs done, actually considering cancer, breast MRI.  Rheumatology, vascular specialist on tap.  High creatinine, negative for thyroid, best estimate her food restriction and suspected lupus are the whole story.  Even looked into an [facility]O clinic in [place] but they don't take Medicare.    Hasn't cooked since she sighted the roach in the kitchen a month ago.  Challenged to resolve her dilemma, either get cookware she can trust or wash, heat, and trust something.    Discussed respite care for [facility], recommended consult a professional service like [facility] or re-engage a senior center.  Resolved to pick a time in the week to get up early and shop in person, partly to reduce the number of hands food passes through, and partly to brave her own fear of respiratory illness.  Could even start with just browsing in a place like [employer], or if necessary, just pulling up and looking the place as a place to break ground in the near future.  Meanwhile, is practicing getting some distance vision indoors with window.  Much easier at the beach, but still helpful home.  Therapeutic modalities: Cognitive Behavioral Therapy and Solution-Oriented/Positive Psychology  Mental Status/Observations:  Appearance:   Casual     Behavior:  Appropriate  Motor:  Normal  Speech/Language:   Clear and Coherent  Affect:  Appropriate  Mood:  wearied  Thought process:  normal  Thought content:    Obsessions  Sensory/Perceptual disturbances:    WNL  Orientation:  Fully oriented  Attention:  Good    Concentration:  Good  Memory:  WNL  Insight:     Good  Judgment:   Good  Impulse Control:  Good   Risk Assessment: Danger to Self: No Self-injurious Behavior: No Danger to Others: No Physical Aggression / Violence: No Duty to Warn: No Access to Firearms a concern: No  Assessment of progress:  progressing  Diagnosis:   ICD-10-CM   1. 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 Caregiver stress  Z63.6     3. Orthorexia nervosa  F50.89     4. Moderate episode of recurrent major depressive disorder (HCC)  F33.1     5. History of posttraumatic stress disorder (PTSD)  Z86.59     6. Psoriatic arthritis (HCC)  L40.50     7. r/o Lupus (SLE) vs. unspecified complex AI disorder  R69      Plan:  Continue seeking appropriate support/info re. 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 Already scheduled visit in this office 07/21/2021.  [name] Fries, PhD [name], PhD LP Clinical Psychologist, [facility] [address] [place], [place] [address] [phone]

## 2021-07-21 ENCOUNTER — Telehealth (INDEPENDENT_AMBULATORY_CARE_PROVIDER_SITE_OTHER): Payer: Medicare HMO | Admitting: Psychiatry

## 2021-07-21 DIAGNOSIS — Z8659 Personal history of other mental and behavioral disorders: Secondary | ICD-10-CM

## 2021-07-21 DIAGNOSIS — F331 Major depressive disorder, recurrent, moderate: Secondary | ICD-10-CM

## 2021-07-21 DIAGNOSIS — B4481 Allergic bronchopulmonary aspergillosis: Secondary | ICD-10-CM

## 2021-07-21 DIAGNOSIS — F5089 Other specified eating disorder: Secondary | ICD-10-CM | POA: Diagnosis not present

## 2021-07-21 DIAGNOSIS — F411 Generalized anxiety disorder: Secondary | ICD-10-CM | POA: Diagnosis not present

## 2021-07-21 DIAGNOSIS — R69 Illness, unspecified: Secondary | ICD-10-CM

## 2021-07-21 DIAGNOSIS — Z758 Other problems related to medical facilities and other health care: Secondary | ICD-10-CM

## 2021-07-21 DIAGNOSIS — Z636 Dependent relative needing care at home: Secondary | ICD-10-CM | POA: Diagnosis not present

## 2021-07-21 DIAGNOSIS — F422 Mixed obsessional thoughts and acts: Secondary | ICD-10-CM

## 2021-07-21 NOTE — Progress Notes (Signed)
Psychotherapy Progress Note [facility] [name], PhD LP  [name])    MRN: [id number] Therapy format: Individual psychotherapy Date: 07/21/2021      Start: 3:18p     Stop: 4:08p     Time Spent: 50 min Location: Telehealth visit -- I connected with this patient by an approved telecommunication method (video), with her informed consent, and verifying identity and patient privacy.  I was located at my office and patient at her home.  As needed, we discussed the limitations, risks, and security and privacy concerns associated with telehealth service, including the availability and conditions which currently govern in-person appointments and the possibility that 3rd-party payment may not be fully guaranteed and she may be responsible for charges.  After she indicated understanding, we proceeded with the session.  Also discussed treatment planning, as needed, including ongoing verbal agreement with the plan, the opportunity to ask and answer all questions, her demonstrated understanding of instructions, and her readiness to call the office should symptoms worsen or she feels she is in a crisis state and needs more immediate and tangible assistance.   Session narrative (presenting needs, interim history, self-report of stressors and symptoms, applications of prior therapy, status changes, and interventions made in session) Started feeling ill yesterday, some suspicion of foodborne illness, getting better today.  Conveys spontaneous well wishes from her friend Sandra Burns (lapsed fellow patient) and husband Sandra Burns.  Miffed with [facility] Rheumatology staff for complaining about her workload instead of facilitating appt and then getting told that because she missed one appt and their policy is one no-show, kicked out of the practice, when she was never sent a notice nor billed for the noshow nor asked about (too sick to attend or notify soon and strung out).   Preparing to write to her doctor to provide customer feedback, has looked into alternative through [place].  Discussed plans for medical followup, continuing quest to discern lupus vs other conditions, including a good find in a pharmacist turned rheumatologist.    Re. kitchen issue, dealing with Sandra Burns code-switching between generosity and obligation about helping pay for things she has needed, from an expensive medicine to new cookware to overcome effects of her roach sighting.  Says she is probably going to multiple glove and use a lot of sanitation procedures to just get through re-entering the kitchen and handling things.  Still struggling with illness fears and food e.g., opening a [employer], worrying about factory contamination, and drinking just the first quarter of it and giving it a few hours to see if she gets ill.  Affirmed that it is OK to "compromise" with OCD, as long as it helps progress, and free up her ability to stabilize basic self-care.  Incremental progress is still progress, and any approach is victory over avoidance.  Therapeutic modalities: Cognitive Behavioral Therapy and Solution-Oriented/Positive Psychology  Mental Status/Observations:  Appearance:   Casual     Behavior:  Appropriate  Motor:  Normal  Speech/Language:   Clear and Coherent  Affect:  Appropriate  Mood:  anxious  Thought process:  normal  Thought content:    Obsessions  Sensory/Perceptual disturbances:    WNL  Orientation:  Fully oriented  Attention:  Good    Concentration:  Good  Memory:  WNL  Insight:    Good  Judgment:   Good  Impulse Control:  Good   Risk Assessment: Danger to Self: No Self-injurious Behavior: No Danger to Others: No Physical Aggression /  Violence: No Duty to Warn: No Access to Firearms a concern: No  Assessment of progress:  progressing  Diagnosis:   ICD-10-CM   1. 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 Caregiver stress  Z63.6      3. Mixed obsessional thoughts and acts  F42.2     4. Orthorexia nervosa  F50.89     5. Moderate episode of recurrent major depressive disorder (HCC)  F33.1     6. History of posttraumatic stress disorder (PTSD)  Z86.59     7. Other problems related to medical facilities and other health care  Z75.8     8. r/o Lupus (SLE) vs. unspecified complex AI disorder  R69     9. 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 Already scheduled visit in this office 07/28/2021.  [name] Fries, PhD [name], PhD LP Clinical Psychologist, [facility] [address] [place], [place] [address] [phone]

## 2021-07-28 ENCOUNTER — Telehealth (INDEPENDENT_AMBULATORY_CARE_PROVIDER_SITE_OTHER): Payer: Medicare HMO | Admitting: Psychiatry

## 2021-07-28 DIAGNOSIS — F5089 Other specified eating disorder: Secondary | ICD-10-CM | POA: Diagnosis not present

## 2021-07-28 DIAGNOSIS — E638 Other specified nutritional deficiencies: Secondary | ICD-10-CM

## 2021-07-28 DIAGNOSIS — Z636 Dependent relative needing care at home: Secondary | ICD-10-CM

## 2021-07-28 DIAGNOSIS — F411 Generalized anxiety disorder: Secondary | ICD-10-CM

## 2021-07-28 DIAGNOSIS — Z8659 Personal history of other mental and behavioral disorders: Secondary | ICD-10-CM

## 2021-07-28 DIAGNOSIS — F331 Major depressive disorder, recurrent, moderate: Secondary | ICD-10-CM

## 2021-07-28 DIAGNOSIS — R69 Illness, unspecified: Secondary | ICD-10-CM
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
## 2021-08-06 ENCOUNTER — Encounter (INDEPENDENT_AMBULATORY_CARE_PROVIDER_SITE_OTHER): Payer: Self-pay | Admitting: Nurse Practitioner

## 2021-08-06 ENCOUNTER — Encounter (INDEPENDENT_AMBULATORY_CARE_PROVIDER_SITE_OTHER): Payer: Self-pay | Admitting: Vascular Surgery

## 2021-08-06 ENCOUNTER — Ambulatory Visit (INDEPENDENT_AMBULATORY_CARE_PROVIDER_SITE_OTHER): Payer: Medicare HMO | Admitting: Nurse Practitioner

## 2021-08-06 ENCOUNTER — Other Ambulatory Visit: Payer: Self-pay

## 2021-08-06 VITALS — BP 143/88 | HR 61 | Resp 16 | Wt 99.2 lb

## 2021-08-06 DIAGNOSIS — T148XXA Other injury of unspecified body region, initial encounter: Secondary | ICD-10-CM

## 2021-08-06 DIAGNOSIS — K5790 Diverticulosis of intestine, part unspecified, without perforation or abscess without bleeding: Secondary | ICD-10-CM | POA: Insufficient documentation

## 2021-08-06 DIAGNOSIS — H02886 Meibomian gland dysfunction of left eye, unspecified eyelid: Secondary | ICD-10-CM | POA: Insufficient documentation

## 2021-08-06 DIAGNOSIS — H209 Unspecified iridocyclitis: Secondary | ICD-10-CM | POA: Insufficient documentation

## 2021-08-06 DIAGNOSIS — B0230 Zoster ocular disease, unspecified: Secondary | ICD-10-CM | POA: Insufficient documentation

## 2021-08-06 DIAGNOSIS — H04123 Dry eye syndrome of bilateral lacrimal glands: Secondary | ICD-10-CM | POA: Insufficient documentation

## 2021-08-06 DIAGNOSIS — L409 Psoriasis, unspecified: Secondary | ICD-10-CM | POA: Insufficient documentation

## 2021-08-06 DIAGNOSIS — M5137 Other intervertebral disc degeneration, lumbosacral region: Secondary | ICD-10-CM | POA: Insufficient documentation

## 2021-08-06 DIAGNOSIS — H18519 Endothelial corneal dystrophy, unspecified eye: Secondary | ICD-10-CM | POA: Insufficient documentation

## 2021-08-07 ENCOUNTER — Encounter (INDEPENDENT_AMBULATORY_CARE_PROVIDER_SITE_OTHER): Payer: Self-pay | Admitting: Nurse Practitioner

## 2021-08-07 NOTE — Progress Notes (Signed)
Subjective:    [name], female    DOB: 12-05-1955, 65 y.o.   MRN: [id number] Chief Complaint  Patient presents with   New Patient (Initial Visit)    Ref le venous problems,bruising,concern area on back of right thigh    Sandra Burns is a 65 year old female that presents today as a referral from Dr. Ubaldo Glassing in regards to concern for bruising.  The patient notes that she had several instances where she would feel a slight sensation and would notice bruising on her toes.  The first happened after a hot shower the second 1 happened without a hot shower.  The most concerning happened while she was on a vacation to the beach.  The patient was sitting in [place] chair and moved from sitting to standing and felt a popping sensation in the back of her leg.  Initially started as a small bruised area but then it grew.  There was some knobbiness in the area.  However, it is noted that he area has completely resolved at this point in time.  There is initial concern that this may be a DVT of some sort or signs symptoms of something more concerning.  She denies any extensive swelling or redness of the entire leg.   Review of Systems  Cardiovascular:  Negative for leg swelling.  Hematological:  Bruises/bleeds easily.  All other systems reviewed and are negative.     Objective:   Physical Exam Vitals reviewed.  Constitutional:      Appearance: She is underweight.  HENT:     Head: Normocephalic.  Cardiovascular:     Rate and Rhythm: Normal rate.     Pulses:          Posterior tibial pulses are 1+ on the right side and 1+ on the left side.  Pulmonary:     Effort: Pulmonary effort is normal.  Skin:    Findings: No bruising.     Comments: Notable spider varicosities bilaterally  Neurological:     Mental Status: She is alert and oriented to person, place, and time.  Psychiatric:        Mood and Affect: Mood normal.        Behavior: Behavior normal.        Thought  Content: Thought content normal.        Judgment: Judgment normal.    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   Osteoporosis    Paroxysmal A-fib (HCC)    Psoriatic arthritis (HCC)    Raynaud's disease    Reactive airway disease    S/P chemotherapy, time since greater than 12 weeks 1984   cervical ca   Vaccine counseling 12/09/2020    Social History   Socioeconomic History  Marital status: Married    Spouse name: Not on file   Number of children: Not on file   Years of education: Not on file   Highest education level: Not on file  Occupational History   Occupation: RN    Comment: Disabled since '09 Cardiopulmonary reasons  Tobacco Use   Smoking status: Former    Types: Cigarettes    Quit date: 07/13/1981    Years since quitting: 40.0   Smokeless tobacco: Never  Vaping Use   Vaping Use: Never used  Substance and Sexual Activity   Alcohol use: Not Currently    Comment: drank as a teen.  Not since   Drug use: Not Currently    Comment: pot as a teen   Sexual activity: Yes  Other Topics Concern   Not on file  Social History Narrative   Not on file   Social Determinants of Health   Financial Resource Strain: Not on file  Food Insecurity: Not on file  Transportation Needs: Not on file  Physical Activity: Not on file  Stress: Not on file  Social Connections: Not on file  Intimate  Partner Violence: Not on file    Past Surgical History:  Procedure Laterality Date   ABDOMINAL HYSTERECTOMY     BACK SURGERY     BREAST BIOPSY Left 20+ YRS AGO   EXCISIONAL - NEG   CHOLECYSTECTOMY     OOPHORECTOMY      Family History  Problem Relation Age of Onset   Lupus Mother    Breast cancer Neg Hx     Allergies  Allergen Reactions   Albuterol Other (See Comments) and Palpitations    Atrial fibrillation Atrial fibrillation    Cephalosporins Itching and Rash    Rash and itching   Flagyl [Metronidazole] Dermatitis and Other (See Comments)    Other Reaction: SKIN SLOUGHING   Macrobid [Nitrofurantoin Macrocrystal] Itching   Neomy-Bacit-Polymyx-Pramoxine Itching and Swelling    Pt got the issue for the eye lids after instill the eyes drops.    Sulfa Antibiotics Itching   Vancomycin Shortness Of Breath    Other reaction(s): Unknown   Vicodin [Hydrocodone-Acetaminophen] Shortness Of Breath   Amoxicillin Itching and Swelling    She had intense itching and a rash see updated answers to questions below  Has patient had a PCN reaction causing immediate rash, facial/tongue/throat swelling, SOB or lightheadedness with hypotension: No Has patient had a PCN reaction causing severe rash involving mucus membranes or skin necrosis: No Has patient had a PCN reaction that required hospitalization: No Has patient had a PCN reaction occurring within the last 10 years: No If all of the above answers are "NO", then may proceed with Ceph   Ciprofloxacin Swelling    Arm swelled up bright red , rash, itching   Latex Rash   Bacitracin    Bacitracin-Neomycin-Polymyxin Itching and Swelling    Pt got the issue for the eye lids after instill the eyes drops.    Nitrofurantoin     Other reaction(s): Unknown   Tobramycin Itching and Swelling    Pt got the issue after instill the eyes drops   Decongest-Aid [Pseudoephedrine] Palpitations   Eggshell Membrane (Chicken) [Egg Shells] Rash    Hydrocodone-Acetaminophen Itching   Levofloxacin Rash    Itchy, swelling   Prednisone Palpitations   Zithromax [Azithromycin] Other (See Comments)    GI upset, C-diff    CBC Latest Ref Rng & Units 04/06/2021 09/16/2018 11/09/2017  WBC 4.0 - 10.5  K/uL 8.3 9.1 7.9  Hemoglobin 12.0 - 15.0 g/dL 16.0(H) 15.5(H) 15.2(H)  Hematocrit 36.0 - 46.0 % 50.3(H) 49.6(H) 46.0  Platelets 150 - 400 K/uL 247 276 272      CMP     Component Value Date/Time   NA 136 04/06/2021 1818   K 4.2 04/06/2021 1818   CL 103 04/06/2021 1818   CO2 21 (L) 04/06/2021 1818   GLUCOSE 84 04/06/2021 1818   BUN 7 (L) 04/06/2021 1818   CREATININE 0.58 04/06/2021 1818   CALCIUM 8.7 (L) 04/06/2021 1818   PROT 7.2 11/09/2017 1211   ALBUMIN 3.7 11/09/2017 1211   AST 28 11/09/2017 1211   ALT 14 11/09/2017 1211   ALKPHOS 78 11/09/2017 1211   BILITOT 0.8 11/09/2017 1211   GFRNONAA >60 04/06/2021 1818   GFRAA >60 09/16/2018 1338     No results found.     Assessment & Plan:   1. Bruising I suspect that this was bruising related to the [place] chair and the patient's low body weight.  The patient has very little body fat and the heart is in the chair may have ruptured some blood vessels inadvertently.  We will have the patient return for bilateral venous reflux study to determine if there is venous reflux which may increase the likelihood of possible ruptures. - VAS [place] LOWER EXTREMITY VENOUS REFLUX; Future   Current Outpatient Medications on File Prior to Visit  Medication Sig Dispense Refill   ALPRAZolam (XANAX) 0.5 MG tablet 1.5 pills in morning, 1.5 pills at lunch, 1 at dinner, and 1.5 pills at bedtime, all prn. 165 tablet 5   Artificial Tear Ointment (REFRESH P.M. OP) Place 1 application into both eyes at bedtime. Apply eye gel to both eyelids     atenolol (TENORMIN) 25 MG tablet Take 12.5 mg by mouth 2 (two) times daily.      budesonide (PULMICORT) 180 MCG/ACT inhaler Inhale 2 puffs into the lungs 2 (two)  times daily.      Cholecalciferol (VITAMIN D3) 10 MCG (400 UNIT) tablet Take by mouth.     COVID-19 mRNA bivalent vaccine, Pfizer, (PFIZER COVID-19 VAC BIVALENT) injection Inject into the muscle. 0.3 mL 0   dicyclomine (BENTYL) 20 MG tablet Take 1 tablet (20 mg total) by mouth 2 (two) times daily. 20 tablet 0   doxycycline (VIBRA-TABS) 100 MG tablet      fluticasone (FLONASE) 50 MCG/ACT nasal spray Place 1 spray into both nostrils daily as needed for allergies.  6   ipratropium (ATROVENT HFA) 17 MCG/ACT inhaler Inhale 2 puffs into the lungs 4 (four) times daily.     Multiple Vitamin (MULTIVITAMIN WITH MINERALS) TABS Take 1 tablet by mouth daily.     mupirocin ointment (BACTROBAN) 2 % APPLY TO AFFECTED AREA 3 TIMES A DAY     pantoprazole (PROTONIX) 40 MG tablet Take 40 mg by mouth daily as needed (GERD). For stomach     Polyvinyl Alcohol-Povidone (REFRESH OP) Place 1 drop into both eyes daily as needed (dry eyes).     ZIRGAN 0.15 % GEL APPLY 1 A SMALL AMOUNT IN AFFECTED EYE 5 TIMES A DAY UNTIL DIRECTED     aspirin 81 MG chewable tablet Chew 81 mg by mouth daily. (Patient not taking: Reported on 08/06/2021)     No current facility-administered medications on file prior to visit.    There are no Patient Instructions on file for this visit. No follow-ups on file.   Kris Hartmann, NP

## 2021-08-15 ENCOUNTER — Telehealth: Payer: Medicare HMO | Admitting: Emergency Medicine

## 2021-08-15 DIAGNOSIS — R1013 Epigastric pain: Secondary | ICD-10-CM

## 2021-08-15 DIAGNOSIS — R110 Nausea: Secondary | ICD-10-CM | POA: Diagnosis not present

## 2021-08-15 NOTE — Progress Notes (Signed)
Virtual Visit Consent   [place], you are scheduled for a virtual visit with a [facility] provider today.     Just as with appointments in the office, your consent must be obtained to participate.  Your consent will be active for this visit and any virtual visit you may have with one of our providers in the next 365 days.     If you have a MyChart account, a copy of this consent can be sent to you electronically.  All virtual visits are billed to your insurance company just like a traditional visit in the office.    As this is a virtual visit, video technology does not allow for your provider to perform a traditional examination.  This may limit your provider's ability to fully assess your condition.  If your provider identifies any concerns that need to be evaluated in person or the need to arrange testing (such as labs, EKG, etc.), we will make arrangements to do so.     Although advances in technology are sophisticated, we cannot ensure that it will always work on either your end or our end.  If the connection with a video visit is poor, the visit may have to be switched to a telephone visit.  With either a video or telephone visit, we are not always able to ensure that we have a secure connection.     I need to obtain your verbal consent now.   Are you willing to proceed with your visit today?    [place] has provided verbal consent on 08/15/2021 for a virtual visit (video or telephone).   Sandra Burns, [place]   Date: 08/15/2021 12:47 PM   Virtual Visit via Video Note   I, Sandra Burns, connected with  [name]  ([id number], 10-Dec-1955) on 08/15/21 at 12:30 PM EST by a video-enabled telemedicine application and verified that I am speaking with the correct person using two identifiers.  Location: Patient: Virtual Visit Location Patient: Home Provider: Virtual Visit Location Provider: Home Office   I discussed the limitations of evaluation and  management by telemedicine and the availability of in person appointments. The patient expressed understanding and agreed to proceed.    History of Present Illness: Sandra Burns is a 65 y.o. who identifies as a female who was assigned female at birth, and is being seen today for improving epigastric discomfort x 2 days.  Denies precipitating event or trauma.  Describes it as constant and sharp/ burning in character.  Has tried TUMs and protonix with minimal relief.  Symptoms are made worse with spicy foods, and stress.  Reports similar symptoms in the past with gastritis.  Had amylase and lipase checked recently with PCP that was WNL.  Does not have gallbladder.  Complains of nausea.  Denies fever, chills, chest pain, vomiting, changes in bowel or bladder habits., blood in stool, dark tarry stools.    Patient is a Marine scientist.    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   Psoriasis (a type of skin inflammation) 08/06/2021   Spine pain, multilevel 03/04/2021   Vaccine counseling 12/09/2020   Bronchiectasis ([place]) 09/04/2019   Multiple allergies 09/04/2019   ABPA (allergic bronchopulmonary aspergillosis) (  [place]) 03/19/2019   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   GAD (generalized anxiety disorder) 07/09/2018   Asthma 07/09/2018   Tricuspid valve prolapse 07/09/2018   Cardiac arrhythmia 07/09/2018   MVP (mitral valve prolapse) 07/09/2018   GERD (gastroesophageal reflux disease) 07/09/2018   Migraines 07/09/2018   PTSD (post-traumatic stress disorder) 07/09/2018   IBS (irritable bowel syndrome) 07/09/2018   Psoriatic arthritis ([place]) 07/09/2018   COPD, mild ([place]) 05/23/2018   Chronic midline low  back pain with sciatica 05/17/2018   Corneal dystrophy 05/04/2017    Allergies:  Allergies  Allergen Reactions   Albuterol Other (See Comments) and Palpitations    Atrial fibrillation Atrial fibrillation    Cephalosporins Itching and Rash    Rash and itching   Flagyl [Metronidazole] Dermatitis and Other (See Comments)    Other Reaction: SKIN SLOUGHING   Macrobid [Nitrofurantoin Macrocrystal] Itching   Neomy-Bacit-Polymyx-Pramoxine Itching and Swelling    Pt got the issue for the eye lids after instill the eyes drops.    Sulfa Antibiotics Itching   Vancomycin Shortness Of Breath    Other reaction(s): Unknown   Vicodin [Hydrocodone-Acetaminophen] Shortness Of Breath   Amoxicillin Itching and Swelling    She had intense itching and a rash see updated answers to questions below  Has patient had a PCN reaction causing immediate rash, facial/tongue/throat swelling, SOB or lightheadedness with hypotension: No Has patient had a PCN reaction causing severe rash involving mucus membranes or skin necrosis: No Has patient had a PCN reaction that required hospitalization: No Has patient had a PCN reaction occurring within the last 10 years: No If all of the above answers are "NO", then may proceed with Ceph   Ciprofloxacin Swelling    Arm swelled up bright red , rash, itching   Latex Rash   Bacitracin    Bacitracin-Neomycin-Polymyxin Itching and Swelling    Pt got the issue for the eye lids after instill the eyes drops.    Nitrofurantoin     Other reaction(s): Unknown   Tobramycin Itching and Swelling    Pt got the issue after instill the eyes drops   Decongest-Aid [Pseudoephedrine] Palpitations   Eggshell Membrane (Chicken) [Egg Shells] Rash   Hydrocodone-Acetaminophen Itching   Levofloxacin Rash    Itchy, swelling   Prednisone Palpitations   Zithromax [Azithromycin] Other (See Comments)    GI upset, C-diff   Medications:  Current Outpatient Medications:    ALPRAZolam (XANAX)  0.5 MG tablet, 1.5 pills in morning, 1.5 pills at lunch, 1 at dinner, and 1.5 pills at bedtime, all prn., Disp: 165 tablet, Rfl: 5   Artificial Tear Ointment (REFRESH P.M. OP), Place 1 application into both eyes at bedtime. Apply eye gel to both eyelids, Disp: , Rfl:    aspirin 81 MG chewable tablet, Chew 81 mg by mouth daily. (Patient not taking: Reported on 08/06/2021), Disp: , Rfl:    atenolol (TENORMIN) 25 MG tablet, Take 12.5 mg by mouth 2 (two) times daily. , Disp: , Rfl:    budesonide (PULMICORT) 180 MCG/ACT inhaler, Inhale 2 puffs into the lungs 2 (two) times daily. , Disp: , Rfl:    Cholecalciferol (VITAMIN D3) 10 MCG (400 UNIT) tablet, Take by mouth., Disp: , Rfl:    [id number] mRNA bivalent vaccine, Pfizer, (PFIZER COVID-19 VAC BIVALENT) injection, Inject into the muscle., Disp: 0.3 mL, Rfl: 0   dicyclomine (BENTYL) 20 MG tablet, Take 1 tablet (20 mg total) by mouth 2 (two) times daily., Disp: 20 tablet, Rfl: 0  fluticasone (FLONASE) 50 MCG/ACT nasal spray, Place 1 spray into both nostrils daily as needed for allergies., Disp: , Rfl: 6   ipratropium (ATROVENT HFA) 17 MCG/ACT inhaler, Inhale 2 puffs into the lungs 4 (four) times daily., Disp: , Rfl:    Multiple Vitamin (MULTIVITAMIN WITH MINERALS) TABS, Take 1 tablet by mouth daily., Disp: , Rfl:    mupirocin ointment (BACTROBAN) 2 %, APPLY TO AFFECTED AREA 3 TIMES A DAY, Disp: , Rfl:    pantoprazole (PROTONIX) 40 MG tablet, Take 40 mg by mouth daily as needed (GERD). For stomach, Disp: , Rfl:    Polyvinyl Alcohol-Povidone (REFRESH OP), Place 1 drop into both eyes daily as needed (dry eyes)., Disp: , Rfl:    ZIRGAN 0.15 % GEL, APPLY 1 A SMALL AMOUNT IN AFFECTED EYE 5 TIMES A DAY UNTIL DIRECTED, Disp: , Rfl:   Observations/Objective: Patient provides VS from home: BP: 114/80 HR: 60 bpm  Patient is well-developed, well-nourished in no acute distress.  Resting comfortably  at home.  Head is normocephalic, atraumatic.  No labored  breathing. Speaking in full sentences, and tolerating own secretions Speech is clear and coherent with logical content.  Patient is alert and oriented at baseline.   Assessment and Plan: 1. Epigastric pain  Patient would like to avoid ER and UC at all cost due to underlying lung disease.  Discussed with patient limitations of of a video visit.  We are unable to rule out ulcer, GI bleed, pancreatitis, or other GI cause via video visit.  Patient understands.  Would like to try outpatient therapy first, with close monitoring by PCP tomorrow.  Understands and agrees she will go to the ED or call 911 immediately if symptoms do not improve, worsen, or new symptoms develop such as fever, chest pain, shortness of breath,  vomiting, blood in stools, black tarry stools, fainting, etc...  Rest and push fluids  Continue with protonix as prescribed Maybe trial some bentyl to help with spasm/ cramping in the abdomen Stick with bland diet Follow up with PCP tomorrow for recheck Call 911 or go to the ED if you have any new or worsening symptoms such as fever, vomiting, chest pain, shortness of breathing, worsening abdominal pain, blood in stool, black stool, etc....  Follow Up Instructions: I discussed the assessment and treatment plan with the patient. The patient was provided an opportunity to ask questions and all were answered. The patient agreed with the plan and demonstrated an understanding of the instructions.  A copy of instructions were sent to the patient via MyChart unless otherwise noted below.    The patient was advised to call back or seek an in-person evaluation if the symptoms worsen or if the condition fails to improve as anticipated.  Time:  I spent 30 minutes with the patient via telehealth technology discussing the above problems/concerns.    Sandra Box, PA-C

## 2021-08-15 NOTE — Patient Instructions (Addendum)
[address], thank you for joining [place], PA-C for today's virtual visit.  While this provider is not your primary care provider (PCP), if your PCP is located in our provider database this encounter information will be shared with them immediately following your visit.  Consent: (Patient) Sandra Burns provided verbal consent for this virtual visit at the beginning of the encounter.  Current Medications:  Current Outpatient Medications:    ALPRAZolam (XANAX) 0.5 MG tablet, 1.5 pills in morning, 1.5 pills at lunch, 1 at dinner, and 1.5 pills at bedtime, all prn., Disp: 165 tablet, Rfl: 5   Artificial Tear Ointment (REFRESH P.M. OP), Place 1 application into both eyes at bedtime. Apply eye gel to both eyelids, Disp: , Rfl:    aspirin 81 MG chewable tablet, Chew 81 mg by mouth daily. (Patient not taking: Reported on 08/06/2021), Disp: , Rfl:    atenolol (TENORMIN) 25 MG tablet, Take 12.5 mg by mouth 2 (two) times daily. , Disp: , Rfl:    budesonide (PULMICORT) 180 MCG/ACT inhaler, Inhale 2 puffs into the lungs 2 (two) times daily. , Disp: , Rfl:    Cholecalciferol (VITAMIN D3) 10 MCG (400 UNIT) tablet, Take by mouth., Disp: , Rfl:    [id number] mRNA bivalent vaccine, Pfizer, (PFIZER COVID-19 VAC BIVALENT) injection, Inject into the muscle., Disp: 0.3 mL, Rfl: 0   dicyclomine (BENTYL) 20 MG tablet, Take 1 tablet (20 mg total) by mouth 2 (two) times daily., Disp: 20 tablet, Rfl: 0   fluticasone (FLONASE) 50 MCG/ACT nasal spray, Place 1 spray into both nostrils daily as needed for allergies., Disp: , Rfl: 6   ipratropium (ATROVENT HFA) 17 MCG/ACT inhaler, Inhale 2 puffs into the lungs 4 (four) times daily., Disp: , Rfl:    Multiple Vitamin (MULTIVITAMIN WITH MINERALS) TABS, Take 1 tablet by mouth daily., Disp: , Rfl:    mupirocin ointment (BACTROBAN) 2 %, APPLY TO AFFECTED AREA 3 TIMES A DAY, Disp: , Rfl:    pantoprazole (PROTONIX) 40 MG tablet, Take 40 mg by mouth daily as  needed (GERD). For stomach, Disp: , Rfl:    Polyvinyl Alcohol-Povidone (REFRESH OP), Place 1 drop into both eyes daily as needed (dry eyes)., Disp: , Rfl:    ZIRGAN 0.15 % GEL, APPLY 1 A SMALL AMOUNT IN AFFECTED EYE 5 TIMES A DAY UNTIL DIRECTED, Disp: , Rfl:    Medications ordered in this encounter:  No orders of the defined types were placed in this encounter.    *If you need refills on other medications prior to your next appointment, please contact your pharmacy*  Follow-Up: Call back or seek an in-person evaluation if the symptoms worsen or if the condition fails to improve as anticipated.  Other Instructions Patient would like to avoid ER and UC at all cost due to underlying lung disease.  Discussed with patient limitations of of a video visit.  We are unable to rule out ulcer, GI bleed, pancreatitis, or other GI cause via video visit.  Patient understands.  Would like to try outpatient therapy first, with close monitoring by PCP tomorrow.  Understands and agrees she will go to the ED or call 911 immediately if symptoms do not improve, worsen, or new symptoms develop such as fever, chest pain, shortness of breath,  vomiting, blood in stools, black tarry stools, fainting, etc...  Rest and push fluids  Continue with protonix as prescribed Maybe trial some bentyl to help with spasm/ cramping in the abdomen Stick with bland diet  Follow up with PCP tomorrow for recheck Call 911 or go to the ED if you have any new or worsening symptoms such as fever, vomiting, chest pain, shortness of breathing, worsening abdominal pain, blood in stool, black stool, etc....   If you have been instructed to have an in-person evaluation today at a local Urgent Care facility, please use the link below. It will take you to a list of all of our available [facility], including address, phone number and hours of operation. Please do not delay care.  [facility]  If you or a family member  do not have a primary care provider, use the link below to schedule a visit and establish care. When you choose a [facility] primary care physician or advanced practice provider, you gain a long-term partner in health. Find a Primary Care Provider  Learn more about [facility]'s in-office and virtual care options: [facility] - Get Care Now

## 2021-08-17 ENCOUNTER — Ambulatory Visit: Payer: Medicare HMO | Admitting: Psychiatry

## 2021-08-17 ENCOUNTER — Other Ambulatory Visit: Payer: Self-pay | Admitting: Gastroenterology

## 2021-08-17 ENCOUNTER — Ambulatory Visit (INDEPENDENT_AMBULATORY_CARE_PROVIDER_SITE_OTHER): Payer: Medicare HMO | Admitting: Vascular Surgery

## 2021-08-17 DIAGNOSIS — R1013 Epigastric pain: Secondary | ICD-10-CM

## 2021-08-17 DIAGNOSIS — F411 Generalized anxiety disorder: Secondary | ICD-10-CM

## 2021-08-17 NOTE — Progress Notes (Signed)
Admin note for non-service contact  [name]  MRN: [id number] DATE: 08/17/2021  Attempted to initiate video session 4:14p, unable to reach, left VM on cell, land line busy.   [name] Fries, PhD [name], PhD LP Clinical Psychologist, [facility] [address] [place], [place] [address] [phone]

## 2021-08-18 ENCOUNTER — Telehealth: Payer: Self-pay | Admitting: Psychiatry

## 2021-08-18 NOTE — Telephone Encounter (Signed)
Pt called and apologized for missing the appointment yesterday,She got an emergency appointment with a gastroologist.  She feels horrible for missing the appointment. She was caught up with all the tests that she did she forgot about the appointmnent

## 2021-08-30 ENCOUNTER — Telehealth: Payer: Self-pay | Admitting: Physician Assistant

## 2021-08-30 NOTE — Telephone Encounter (Signed)
Patient is having an upper endoscopy this week and will be medicated with propofol for the procedure. She said the surgical center can't tell her when to resume taking alprazolam afterwards and wanted to know if you could tell her. She stated she is working with you on weaning her off the alprazolam and there has been a day where she forgot to take QID, which she was pleased with since the plan is to wean her and she wants "to get off this mess."

## 2021-08-30 NOTE — Telephone Encounter (Signed)
I spoke with patient regarding appt for AM. She has a question for RM about some medication. States that she is having a procedure done on Wednesday and would like to know when she can resume taking medication. Ph: [phone]

## 2021-08-30 NOTE — Telephone Encounter (Signed)
She can take a Xanax within 4 to 6 hours after she gets home from the procedure, but if she is still extremely drowsy after she gets home, I would wait until she is more alert and awake.  She should not experience any withdrawals if she has not taken one in 8-10 hours or so.

## 2021-08-30 NOTE — Telephone Encounter (Signed)
Patient notified

## 2021-08-31 ENCOUNTER — Telehealth (INDEPENDENT_AMBULATORY_CARE_PROVIDER_SITE_OTHER): Payer: Medicare HMO | Admitting: Psychiatry

## 2021-08-31 DIAGNOSIS — F422 Mixed obsessional thoughts and acts: Secondary | ICD-10-CM

## 2021-08-31 DIAGNOSIS — Z8659 Personal history of other mental and behavioral disorders: Secondary | ICD-10-CM

## 2021-08-31 DIAGNOSIS — G4721 Circadian rhythm sleep disorder, delayed sleep phase type: Secondary | ICD-10-CM | POA: Diagnosis not present

## 2021-08-31 DIAGNOSIS — F331 Major depressive disorder, recurrent, moderate: Secondary | ICD-10-CM

## 2021-08-31 DIAGNOSIS — R69 Illness, unspecified: Secondary | ICD-10-CM

## 2021-08-31 DIAGNOSIS — E638 Other specified nutritional deficiencies: Secondary | ICD-10-CM

## 2021-08-31 DIAGNOSIS — Z636 Dependent relative needing care at home: Secondary | ICD-10-CM

## 2021-08-31 NOTE — Progress Notes (Signed)
Psychotherapy Progress Note [facility] [name], PhD LP  [name])    MRN: [id number] Therapy format: Individual psychotherapy Date: 08/31/2021      Start: 10:19a     Stop: 11:09a     Time Spent: 50 min Location: Telehealth visit -- I connected with this patient by an approved telecommunication method (video), with her informed consent, and verifying identity and patient privacy.  I was located at my office and patient at her home.  As needed, we discussed the limitations, risks, and security and privacy concerns associated with telehealth service, including the availability and conditions which currently govern in-person appointments and the possibility that 3rd-party payment may not be fully guaranteed and she may be responsible for charges.  After she indicated understanding, we proceeded with the session.  Also discussed treatment planning, as needed, including ongoing verbal agreement with the plan, the opportunity to ask and answer all questions, her demonstrated understanding of instructions, and her readiness to call the office should symptoms worsen or she feels she is in a crisis state and needs more immediate and tangible assistance.   Session narrative (presenting needs, interim history, self-report of stressors and symptoms, applications of prior therapy, status changes, and interventions made in session) Apologetic for miss on 11/22.  A lot happening.  Was to have GI procedure done tomorrow, but felt the arrangements were too shaky to trust, for her having lost weight and being on alprazolam, worry whether anesthesia would be properly prepared, especially when she notices the benzo was not on her record.  Also worry when to resume her alprazolam after the procedure.  H unreliable to take messages as well, e.g., her CT scheduling.  Thinking she'll cancel her abdominal CT as well.  After bringing up her questions, they've reassigned her  to Feb and she got told it's because she's "on multiple meds", a phrasing that burned her up.  Irritating to be referred, have the specialist (Dr. Timothy Lasso) OK the procedure, then have staff go through machinations that stand to hold her in mystery pain two months longer and be patronized.  Tempted to infer that they were "calling" her an addict, when the sound of things is that the clinic was more simply in full CYA mode -- the call she received was syrupy and redundant, claiming they were just looking out for her safety (re. "health problems" and Xanax) when it seemed clear they were looking out for theirs (i.e, vs. Lawsuit if she had complications).  [name] compounded the aggravation by telling her she got in her own way asking her questions, and then he got defensive about how he was just trying to care for her in telling her that.  Attempts to get him to understand how he sounded got kitchen-sinked with his defensive reactions and her explaining until overly frustrating.  On top of things, she's learned she is allergic both to eggs and to the egg-free vaccine.  Knows the tripledemic is on now, too, in public spaces, thwarting plans to become more mobile in the world and re-expand her mobility and lifestyle.  Personally, going to sleep 3:30-4:30am, up 9:30am, take "my addict medicine" and sleep till noon, then eat, lie down again, maybe start her sustained day 6pm.  Better last night, got to sleep by 3am.  Acknowledges a disordered circadian rhythm, but really just doesn't care right now.  Just another holiday season sequestered because other people are too contagious and un-careful to actually see.  Is fundamentally mad at people for not taking a couple steps to be safely available to visit, feels "nobody gives a shit" when to her it's common sense caring if it was hers to do for a friend.  C/o her friends being bad listeners as well, turning the tables when she tries to take her turn.  [name] can be the  worst, though she did try to address him repeatedly changing the subject to himself.  The only friend who has a track record of respectful listening is beaten down with cardiac problems right now.  Lupita Leash extra notable for habit of saying, "Let me tell about MY day...", enough that it's a joke at home.  Support/empathy provided.   Nutritionally, is mostly achieving 1000 cal/day, trying to maintain 1500, relies a lot on Boost.  Does shop convenience foods.    Therapeutic modalities: Cognitive Behavioral Therapy, Solution-Oriented/Positive Psychology, and Ego-Supportive  Mental Status/Observations:  Appearance:   Casual     Behavior:  Appropriate  Motor:  Normal  Speech/Language:   Clear and Coherent  Affect:  Depressed  Mood:  depressed and irritable per situation  Thought process:  normal  Thought content:    WNL  Sensory/Perceptual disturbances:    WNL  Orientation:  Fully oriented  Attention:  Good    Concentration:  Good  Memory:  WNL  Insight:    Good  Judgment:   Good  Impulse Control:  Good   Risk Assessment: Danger to Self: No Self-injurious Behavior: No Danger to Others: No Physical Aggression / Violence: No Duty to Warn: No Access to Firearms a concern: No  Assessment of progress:  stabilized  Diagnosis:   ICD-10-CM   1. Moderate episode of recurrent major depressive disorder (HCC)  F33.1     2. History of posttraumatic stress disorder (PTSD)  Z86.59     3. Caregiver stress  Z63.6     4. Imbalanced nutrition  E63.8     5. r/o Lupus (SLE) vs. unspecified complex AI disorder  R69      Plan:  Supplement and build nutrition as able Keep trying to brave lesser food quarantines and broader portions Mobility in the world at discretion, given assorted virus risks Other recommendations/advice as may be noted above Continue to utilize previously learned skills ad lib Maintain medication as prescribed and work faithfully with relevant prescriber(s) if any changes are  desired or seem indicated Call the clinic on-call service, 988/hotline, 911, or present to [facility] or ER if any life-threatening psychiatric crisis Return for session(s) already scheduled. Already scheduled visit in this office 09/07/2021.  [name] Fries, PhD [name], PhD LP Clinical Psychologist, [name] [address] [place], [place] [address] [phone]

## 2021-09-03 ENCOUNTER — Encounter (INDEPENDENT_AMBULATORY_CARE_PROVIDER_SITE_OTHER): Payer: Medicare HMO

## 2021-09-03 ENCOUNTER — Ambulatory Visit (INDEPENDENT_AMBULATORY_CARE_PROVIDER_SITE_OTHER): Payer: Medicare HMO | Admitting: Vascular Surgery

## 2021-09-07 ENCOUNTER — Ambulatory Visit (INDEPENDENT_AMBULATORY_CARE_PROVIDER_SITE_OTHER): Payer: Medicare HMO | Admitting: Psychiatry

## 2021-09-07 ENCOUNTER — Telehealth: Payer: Self-pay | Admitting: Physician Assistant

## 2021-09-07 DIAGNOSIS — F4310 Post-traumatic stress disorder, unspecified: Secondary | ICD-10-CM | POA: Diagnosis not present

## 2021-09-07 DIAGNOSIS — F331 Major depressive disorder, recurrent, moderate: Secondary | ICD-10-CM

## 2021-09-07 DIAGNOSIS — Z62820 Parent-biological child conflict: Secondary | ICD-10-CM | POA: Diagnosis not present

## 2021-09-07 DIAGNOSIS — Z636 Dependent relative needing care at home: Secondary | ICD-10-CM

## 2021-09-07 DIAGNOSIS — Z638 Other specified problems related to primary support group: Secondary | ICD-10-CM

## 2021-09-07 DIAGNOSIS — E638 Other specified nutritional deficiencies: Secondary | ICD-10-CM

## 2021-09-07 DIAGNOSIS — F5089 Other specified eating disorder: Secondary | ICD-10-CM

## 2021-09-07 NOTE — Telephone Encounter (Signed)
She was trying to fill her medicine pill reminder this am and had a question about Xanax . She normally takes 1 1/2 per day x 4 times a day. She got confused with what she put in there and what she had already taken. She may not have taken her full dose this am ( she thinks she only took a 1/2 pill) and so she just wanted to make sure she should just wait until her next dose and resume the 1 1/2 at a time as usual? Her next dose should be 2:45. She just wants to know what to do if it happens again.

## 2021-09-07 NOTE — Telephone Encounter (Signed)
Yes, she would just wait till the next scheduled dose and time.

## 2021-09-07 NOTE — Telephone Encounter (Signed)
Pt informed
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
## 2021-09-07 NOTE — Telephone Encounter (Signed)
Please review

## 2021-09-13 ENCOUNTER — Encounter: Payer: Self-pay | Admitting: Physician Assistant

## 2021-09-13 ENCOUNTER — Telehealth: Payer: Medicare HMO | Admitting: Physician Assistant

## 2021-09-13 ENCOUNTER — Telehealth: Payer: Self-pay | Admitting: Physician Assistant

## 2021-09-13 DIAGNOSIS — F411 Generalized anxiety disorder: Secondary | ICD-10-CM | POA: Diagnosis not present

## 2021-09-13 DIAGNOSIS — F331 Major depressive disorder, recurrent, moderate: Secondary | ICD-10-CM | POA: Diagnosis not present

## 2021-09-13 MED ORDER — AMITRIPTYLINE HCL 25 MG PO TABS: 12.5000 mg | ORAL_TABLET | Freq: Every day | ORAL | 1 refills | Status: DC

## 2021-09-13 NOTE — Telephone Encounter (Signed)
Pt called to remind you she only has 1 refill left on Alprazolam. Asking you top send new Rx.

## 2021-09-13 NOTE — Telephone Encounter (Signed)
I confirmed with pharmacy she is has one refill available, but it is too early to fill. Last refill was 11/29.

## 2021-09-13 NOTE — Telephone Encounter (Signed)
Please call her pharmacy and confirm.  She should have 1 or 2 more refills, and enough to get her through until February.  I do not want to send in a new prescription now because they expire in 6 months.  I prefer to wait until the pharmacy requests in February.

## 2021-09-13 NOTE — Progress Notes (Signed)
[facility]  [name],  MRN: [id number]  PCP: Tracie Harrier, MD  Date of Evaluation: 09/13/2021 Time spent:30 minutes  Chief Complaint:  Chief Complaint   Anxiety; Follow-up    Virtual Visit via Telehealth  I connected with patient by a video enabled telemedicine application with their informed consent, and verified patient privacy and that I am speaking with the correct person using two identifiers.  I am private, in my office and the patient is at home work.  I discussed the limitations, risks, security and privacy concerns of performing an evaluation and management service by telephone video and the availability of in person appointments. I also discussed with the patient that there may be a patient responsible charge related to this service. The patient expressed understanding and agreed to proceed.   I discussed the assessment and treatment plan with the patient. The patient was provided an opportunity to ask questions and all were answered. The patient agreed with the plan and demonstrated an understanding of the instructions.   The patient was advised to call back or seek an in-person evaluation if the symptoms worsen or if the condition fails to improve as anticipated.  I provided 30 minutes of non-face-to-face time during this encounter.  HISTORY/CURRENT STATUS: For routine med check.  States she's more depressed, not wanting to do things like she used to, but it's not a daily thing. Sometimes will do crafts but not like she used to. Wants to sleep a lot but she really can't as her husband's caregiver. Personal hygiene is normal. Appetite is decreased, has lost some weight, she wants to gain weight, but stress seems to make things worse. Is seeing GI. Sleeps ok. No SI/HI.   Husband is about the same. She's sole care-giver, is using all the resources she can to help him. Is in a support group through [place], which helps.   Anxiety is  controlled with the Xanax.  She would like to keep weaning down and someday off of the Xanax.  But right now the Xanax is helping control the situational anxiety.    She's still in counseling with Dr. [name] which is very helpful.   Patient denies increased energy with decreased need for sleep, no increased talkativeness, no racing thoughts, no impulsivity or risky behaviors, no increased spending, no increased libido, no grandiosity, no paranoia, no AH/VH.  Denies dizziness, syncope, seizures, numbness, tingling, tremor, tics, unsteady gait, slurred speech, confusion. Denies muscle or joint pain, stiffness, or dystonia.  Individual Medical History/ Review of Systems: Changes? :Yes   having stomach problems, weight loss is seeing GI. Endoscopy will be coming up soon, to r/o Barrett's. Also having abnl labs, IgG   Past medications for mental health diagnoses include: Zoloft, caused psychosis w/ hallucinations,  BuSpar, Ativan, Effexor (became psychotic) She was told to never take any kind of SSRI/SNRI again. Wellbutrin caused agitation.  Allergies: Albuterol, Cephalosporins, Flagyl [metronidazole], Macrobid [nitrofurantoin macrocrystal], Neomy-bacit-polymyx-pramoxine, Sulfa antibiotics, Vancomycin, Vicodin [hydrocodone-acetaminophen], Amoxicillin, Ciprofloxacin, Latex, Bacitracin, Bacitracin-neomycin-polymyxin, Nitrofurantoin, Tobramycin, Decongest-aid [pseudoephedrine], Eggshell membrane (chicken) [egg shells], Hydrocodone-acetaminophen, Levofloxacin, Prednisone, and Zithromax [azithromycin]  Current Medications:  Current Outpatient Medications:    ALPRAZolam (XANAX) 0.5 MG tablet, 1.5 pills in morning, 1.5 pills at lunch, 1 at dinner, and 1.5 pills at bedtime, all prn., Disp: 165 tablet, Rfl: 5   amitriptyline (ELAVIL) 25 MG tablet, Take 0.5 tablets (12.5 mg total) by mouth at bedtime., Disp: 30 tablet, Rfl: 1   Artificial Tear Ointment (REFRESH P.M.  OP), Place 1 application into both eyes  at bedtime. Apply eye gel to both eyelids, Disp: , Rfl:    atenolol (TENORMIN) 25 MG tablet, Take 12.5 mg by mouth 2 (two) times daily. , Disp: , Rfl:    budesonide (PULMICORT) 180 MCG/ACT inhaler, Inhale 2 puffs into the lungs 2 (two) times daily. , Disp: , Rfl:    Cholecalciferol (VITAMIN D3) 10 MCG (400 UNIT) tablet, Take by mouth., Disp: , Rfl:    dicyclomine (BENTYL) 20 MG tablet, Take 1 tablet (20 mg total) by mouth 2 (two) times daily., Disp: 20 tablet, Rfl: 0   fluticasone (FLONASE) 50 MCG/ACT nasal spray, Place 1 spray into both nostrils daily as needed for allergies., Disp: , Rfl: 6   ipratropium (ATROVENT HFA) 17 MCG/ACT inhaler, Inhale 2 puffs into the lungs 4 (four) times daily., Disp: , Rfl:    Multiple Vitamin (MULTIVITAMIN WITH MINERALS) TABS, Take 1 tablet by mouth daily., Disp: , Rfl:    mupirocin ointment (BACTROBAN) 2 %, APPLY TO AFFECTED AREA 3 TIMES A DAY, Disp: , Rfl:    pantoprazole (PROTONIX) 40 MG tablet, Take 40 mg by mouth daily as needed (GERD). For stomach, Disp: , Rfl:    Polyvinyl Alcohol-Povidone (REFRESH OP), Place 1 drop into both eyes daily as needed (dry eyes)., Disp: , Rfl:    ZIRGAN 0.15 % GEL, APPLY 1 A SMALL AMOUNT IN AFFECTED EYE 5 TIMES A DAY UNTIL DIRECTED, Disp: , Rfl:    aspirin 81 MG chewable tablet, Chew 81 mg by mouth daily. (Patient not taking: Reported on 08/06/2021), Disp: , Rfl:    COVID-19 mRNA bivalent vaccine, Pfizer, (PFIZER COVID-19 VAC BIVALENT) injection, Inject into the muscle., Disp: 0.3 mL, Rfl: 0 Medication Side Effects: none  Family Medical/ Social History: Changes? No  MENTAL HEALTH EXAM:  There were no vitals taken for this visit.There is no height or weight on file to calculate BMI.  General Appearance: Casual, Neat and Well Groomed  Eye Contact:  Good  Speech:  Clear and Coherent and Normal Rate  Volume:  Normal  Mood:  Euthymic  Affect:  Appropriate  Thought Process:  Goal Directed and Descriptions of Associations:  Intact  Orientation:  Full (Time, Place, and Person)  Thought Content: Logical   Suicidal Thoughts:  No  Homicidal Thoughts:  No  Memory:  WNL  Judgement:  Good  Insight:  Good  Psychomotor Activity:  Normal  Concentration:  Concentration: Good and Attention Span: Good  Recall:  Good  Fund of Knowledge: Good  Language: Good  Assets:  Desire for Improvement  ADL's:  Intact  Cognition: WNL  Prognosis:  Good    DIAGNOSES:    ICD-10-CM   1. Moderate episode of recurrent major depressive disorder (HCC)  F33.1     2. Generalized anxiety disorder with hx panic & agoraphobia  F41.1         Receiving Psychotherapy: Yes w/ Dr. [name]   RECOMMENDATIONS:  PDMP reviewed.  Last Xanax filled 08/24/2021. I provided 30 minutes of non-face-to-face time during this encounter, including time spent before and after the visit in records review, medical decision making, counseling pertinent to today's visit, and charting.  Discussed starting Elavil, we have talked about it before. She would like to try it, is very sensitive to meds, has had problems with SSRIs and SNRIs in the past.  We discussed the benefits, risks and side effects of the amitriptyline.  We will start at an extremely low dose.  Discussed mood therapy lamp. Start amitriptyline 25 mg one half p.o. nightly for about 2 weeks, if tolerating it well she can increase to 1 p.o. nightly.  If she has problems tolerating it after a week or so she can just stop it.   Continue Xanax 0.5 mg, 1.5 pills every morning, 1.5 pill at lunch, 1 pill at dinner, 1.5 at bedtime as needed.  Take that evening dose approximately 2 hours before bedtime and then take the amitriptyline at bedtime. Continue all supplements. Continue psychotherapy with Dr. [name].  Return in 6 to 8 weeks, if she had to stop the amitriptyline though she can cancel that appointment if she is feeling better and come in 6 months as usual.  Melony Overly, PA-C

## 2021-09-13 NOTE — Telephone Encounter (Signed)
Thank you. Please let patient know that. When her RF is due in about 6 weeks, have her pharmacy contact us and I'll RF.

## 2021-09-14 NOTE — Telephone Encounter (Signed)
Patient notified and she understood. She said her refills and her appointments with Rosey Bath usually coincide at the same time.

## 2021-09-16 ENCOUNTER — Ambulatory Visit: Payer: Medicare HMO

## 2021-09-22 ENCOUNTER — Telehealth: Payer: Medicare HMO | Admitting: Physician Assistant

## 2021-10-05 ENCOUNTER — Ambulatory Visit (INDEPENDENT_AMBULATORY_CARE_PROVIDER_SITE_OTHER): Payer: Medicare HMO | Admitting: Psychiatry

## 2021-10-05 DIAGNOSIS — F5089 Other specified eating disorder: Secondary | ICD-10-CM

## 2021-10-05 DIAGNOSIS — Z636 Dependent relative needing care at home: Secondary | ICD-10-CM

## 2021-10-05 DIAGNOSIS — F411 Generalized anxiety disorder: Secondary | ICD-10-CM

## 2021-10-05 DIAGNOSIS — F331 Major depressive disorder, recurrent, moderate: Secondary | ICD-10-CM | POA: Diagnosis not present

## 2021-10-05 DIAGNOSIS — Z8659 Personal history of other mental and behavioral disorders: Secondary | ICD-10-CM | POA: Diagnosis not present

## 2021-10-05 NOTE — Progress Notes (Signed)
Psychotherapy Progress Note [facility] [name], PhD LP  [name])    MRN: [id number] Therapy format: Individual psychotherapy Date: 10/05/2021      Start: 2:15p     Stop: 3:05p     Time Spent: 50 min Location: Telehealth visit -- I connected with this patient by an approved telecommunication method (video), with her informed consent, and verifying identity and patient privacy.  I was located at my office and patient at her home.  As needed, we discussed the limitations, risks, and security and privacy concerns associated with telehealth service, including the availability and conditions which currently govern in-person appointments and the possibility that 3rd-party payment may not be fully guaranteed and she may be responsible for charges.  After she indicated understanding, we proceeded with the session.  Also discussed treatment planning, as needed, including ongoing verbal agreement with the plan, the opportunity to ask and answer all questions, her demonstrated understanding of instructions, and her readiness to call the office should symptoms worsen or she feels she is in a crisis state and needs more immediate and tangible assistance.   Session narrative (presenting needs, interim history, self-report of stressors and symptoms, applications of prior therapy, status changes, and interventions made in session) Continues to work with [name]'s reduced mental status, childlike ways, and diabetes self-care.  Working better at keeping her own calm and not stumbling into sounding parental enough to trigger his oppositionality.  His CGM is a big help knowing.  Involved in a caregiver support group hosted by [employer].  Of interest today is mood changes for the better.  Was especially weary ahead of Xmas and was recommended amitriptyline 12/19.  Never took it, but the mood and dread lifted spontaneously when Christmas passed.  Has thought about [place]  suggestion she is seeing [name]'s childhood replayed now, better able to release expectations for him to be a full adult.  Tobie Poet, addicted to pain meds, whom Briseyda felt led to contact, had the most lucid conversation in ages, and recommended he to an inspiring/uplifting docudrama about the early disciples of Jesus.  Enough benefit from all to abrogate the need for extra medication, finds her self interested and planning things again.    Re. weight concern -- noticed that some relationship stress set her back in eating, had the insight to recognize she could still eat for her own good reasons regardless of circumstances.  Now consistently waking up at 100 lbs (formerly 97, substantive improvement).  Looked back and discovered her normal weight in the day was only 110lbs, changed her perspective.  Including coconut and coconut oil, which also has various bodily uses, like for psoriasis patches.  Branching out in diversity in what she eats.  Not weighing as often.  Sleep drive earlier as she goes ahead and gets up and does not lounge in bed routinely any more.  Getting something eaten first thing.  Getting stronger natural light exposure daytime, including an intentional 30 minutes in sunbeams QAM.  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  (Thought of engaging [name], but he turned her off again voicing attraction to her and showing that he had been drinking.)  Engaged other artists in Press [employer] unfinished poetry as a communal project.  A little exercise, too, picking up walking again.  Therapeutic modalities: Cognitive Behavioral Therapy, Solution-Oriented/Positive Psychology, and Insight-Oriented  Mental Status/Observations:  Appearance:   Casual     Behavior:  Appropriate  Motor:  Normal  Speech/Language:   Clear and Coherent  Affect:  Appropriate  Mood:  Stressed, less  Thought process:  normal  Thought content:    WNL  Sensory/Perceptual  disturbances:    WNL  Orientation:  Fully oriented  Attention:  Good    Concentration:  Good  Memory:  WNL  Insight:    Good  Judgment:   Good  Impulse Control:  Good   Risk Assessment: Danger to Self: No Self-injurious Behavior: No Danger to Others: No Physical Aggression / Violence: No Duty to Warn: No Access to Firearms a concern: No  Assessment of progress:  progressing  Diagnosis:   ICD-10-CM   1. Moderate episode of recurrent major depressive disorder (HCC)  F33.1    improved    2. 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 Caregiver stress  Z63.6     4. History of posttraumatic stress disorder (PTSD)  Z86.59     5. Orthorexia nervosa  F50.89      Plan:  Continue all of the above health habits Continue addressing fear of illness, restricted eating, and weight stabilization, including seeking good fats and variety Continue emphasis on measuring how far she's come, not how far to go for things like weight recovery Continue reframing [name]'s behavior as second childhood Other recommendations/advice as may be noted above Continue to utilize previously learned skills ad lib Maintain medication as prescribed and work faithfully with relevant prescriber(s) if any changes are desired or seem indicated Call the clinic on-call service, 988/hotline, 911, or present to [facility] or ER if any life-threatening psychiatric crisis Return for session(s) already scheduled. Already scheduled visit in this office 10/19/2021.  [name] Fries, PhD [name], PhD LP Clinical Psychologist, [facility] [address] [place], [place] [address] [phone]

## 2021-10-16 ENCOUNTER — Other Ambulatory Visit: Payer: Self-pay | Admitting: Physician Assistant

## 2021-10-19 ENCOUNTER — Ambulatory Visit (INDEPENDENT_AMBULATORY_CARE_PROVIDER_SITE_OTHER): Payer: Medicare HMO | Admitting: Psychiatry

## 2021-10-19 DIAGNOSIS — F331 Major depressive disorder, recurrent, moderate: Secondary | ICD-10-CM | POA: Diagnosis not present

## 2021-10-19 DIAGNOSIS — Z636 Dependent relative needing care at home: Secondary | ICD-10-CM

## 2021-10-19 DIAGNOSIS — Z8659 Personal history of other mental and behavioral disorders: Secondary | ICD-10-CM

## 2021-10-19 DIAGNOSIS — E638 Other specified nutritional deficiencies: Secondary | ICD-10-CM

## 2021-10-19 DIAGNOSIS — F411 Generalized anxiety disorder: Secondary | ICD-10-CM

## 2021-10-19 DIAGNOSIS — F5089 Other specified eating disorder: Secondary | ICD-10-CM | POA: Diagnosis not present

## 2021-10-19 NOTE — Progress Notes (Signed)
Psychotherapy Progress Note [facility] Sandra [name], PhD LP  [name])    MRN: [id number] Therapy format: Individual psychotherapy Date: 10/19/2021      Start: 2:14p     Stop: 3:04p     Time Spent: 50 min Location: Telehealth visit -- I connected with this patient by an approved telecommunication method (video), with her informed consent, and verifying identity and patient privacy.  I was located at my office and patient at her home.  As needed, we discussed the limitations, risks, and security and privacy concerns associated with telehealth service, including the availability and conditions which currently govern in-person appointments and the possibility that 3rd-party payment may not be fully guaranteed and she may be responsible for charges.  After she indicated understanding, we proceeded with the session.  Also discussed treatment planning, as needed, including ongoing verbal agreement with the plan, the opportunity to ask and answer all questions, her demonstrated understanding of instructions, and her readiness to call the office should symptoms worsen or she feels she is in a crisis state and needs more immediate and tangible assistance.   Session narrative (presenting needs, interim history, self-report of stressors and symptoms, applications of prior therapy, status changes, and interventions made in session) Frustrations with Sandra Burns, with variable mental status (vascular dementia) and abiding resistance to treating strongly suspected apnea he has adamantly declined to test or treat for years.  Now seeing Sandra Burns be more restless and physically back up when talking about almost anything, unless he is already sitting.  Interpreted dementia eroding his sense of security, making it easier for nonverbals to feel inadvertently threatening to him.  Benefiting from practicing acceptance of his dementia, a couple months now since she was agonizing,  irritable, demanding.  Affirmed and encouraged.  Has discovered her stomach pain stopped altogether 2 months ago as she turned the corner on stress and believes course of Protonix let her heal, now thinking of calling off endoscopy.  Pain had lasted a year and a half, beginning under stress with Sandra Burns's pancreatic illness.  Seems clear enough that the gastric irritation she had was effectively treated.  Currently eating a little more variety, able to down cereal and crackers that she couldn't, and trying to abide by q 2 hr eating.  Has prepared to eat "real food" again, has everything she needs to clean counters and have reliable cookware (partly for phobia reasons, re. the cockroach sightings).  Affirmed taking the steps to approach her fear and normalize living and nutrition.  Recalls during her first marriage when she needed painstaking exposure therapy for agoraphobia.  Also had such food restriction then that she was only consuming a Centrum and a half gallon of whole milk a day and hit 87#.  Sees benefit to some junk food, in that it gets more fat into her diet, helps her in many ways.  Pushing the boundaries of her variety in diet, has accomplished cheese and shredded wheat, looking to include more veggies once kitchen is fully set back in order.  Has obtained most of the cookware and utensils she'll need.    Turned 66 2 days ago.  Feels better than had worried about.  Excited to inform [place] that her friend Sandra Burns will be coming back for therapy, admitted need, long-deferred.  Therapeutic modalities: Cognitive Behavioral Therapy and Solution-Oriented/Positive Psychology  Mental Status/Observations:  Appearance:   Casual     Behavior:  Appropriate  Motor:  Normal  Speech/Language:  Clear and Coherent  Affect:  Appropriate  Mood:  normal and stressed  Thought process:  normal  Thought content:    WNL  Sensory/Perceptual disturbances:    WNL  Orientation:  Fully oriented  Attention:  Good     Concentration:  Good  Memory:  WNL  Insight:    Good  Judgment:   Good  Impulse Control:  Good   Risk Assessment: Danger to Self: No Self-injurious Behavior: No Danger to Others: No Physical Aggression / Violence: No Duty to Warn: No Access to Firearms a concern: No  Assessment of progress:  progressing  Diagnosis:   ICD-10-CM   1. 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 Caregiver stress  Z63.6     3. Orthorexia nervosa  F50.89     4. Moderate episode of recurrent major depressive disorder (HCC)  F33.1     5. History of posttraumatic stress disorder (PTSD)  Z86.59     6. 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 diet and digestion  Continue addressing fear of illness, restricted eating, and weight stabilization, including seeking good fats and variety Continue emphasis on measuring how far she's come, not how far to go for things like weight recovery Continue reframing Sandra Burns's behavior as second childhood Other recommendations/advice as may be noted above Continue to utilize previously learned skills ad lib Maintain medication as prescribed and work faithfully with relevant prescriber(s) if any changes are desired or seem indicated Call the clinic on-call service, 988/hotline, 911, or present to [facility] or ER if any life-threatening psychiatric crisis Return for session(s) already scheduled. Already scheduled visit in this office 10/25/2021.  Sandra Fries, PhD Sandra [name], PhD LP Clinical Psychologist, [facility] [address] [place], [place] [address] [phone]

## 2021-10-20 ENCOUNTER — Ambulatory Visit: Admission: RE | Admit: 2021-10-20 | Payer: Medicare HMO | Source: Ambulatory Visit

## 2021-10-20 ENCOUNTER — Other Ambulatory Visit: Payer: Self-pay | Admitting: Physician Assistant

## 2021-10-20 NOTE — Telephone Encounter (Signed)
Pt check on the status. Needs to have it by Friday trying to go out of town

## 2021-10-25 ENCOUNTER — Encounter: Payer: Self-pay | Admitting: Physician Assistant

## 2021-10-25 ENCOUNTER — Telehealth (INDEPENDENT_AMBULATORY_CARE_PROVIDER_SITE_OTHER): Payer: Medicare HMO | Admitting: Physician Assistant

## 2021-10-25 DIAGNOSIS — F4310 Post-traumatic stress disorder, unspecified: Secondary | ICD-10-CM

## 2021-10-25 DIAGNOSIS — F422 Mixed obsessional thoughts and acts: Secondary | ICD-10-CM

## 2021-10-25 DIAGNOSIS — Z636 Dependent relative needing care at home: Secondary | ICD-10-CM | POA: Diagnosis not present

## 2021-10-25 DIAGNOSIS — F411 Generalized anxiety disorder: Secondary | ICD-10-CM

## 2021-10-25 NOTE — Progress Notes (Signed)
[facility]  [name],  MRN: [id number]  PCP: Tracie Harrier, MD  Date of Evaluation: 10/25/2021 Time spent:20 minutes  Chief Complaint:  Chief Complaint   Anxiety; Follow-up     Virtual Visit via Telehealth  I connected with patient by a video enabled telemedicine application with their informed consent, and verified patient privacy and that I am speaking with the correct person using two identifiers.  I am private, in my office and the patient is at home.  I discussed the limitations, risks, security and privacy concerns of performing an evaluation and management service by video and the availability of in person appointments. I also discussed with the patient that there may be a patient responsible charge related to this service. The patient expressed understanding and agreed to proceed.   I discussed the assessment and treatment plan with the patient. The patient was provided an opportunity to ask questions and all were answered. The patient agreed with the plan and demonstrated an understanding of the instructions.   The patient was advised to call back or seek an in-person evaluation if the symptoms worsen or if the condition fails to improve as anticipated.  I provided 20 minutes of non-face-to-face time during this encounter.  HISTORY/CURRENT STATUS: For routine med check.  At [facility] about 6 weeks ago, we added Elavil. She didn't start it. Felt better instantly the day after Christmas. That holiday is always hard for her but this year, was triggered by a note from her daughter in law that was 'passive aggressive.' She's estranged from her son, for several years now so that was an unpleasant surprise.   Able to enjoy things, continues to work on beading and other hobbies, energy and motivation are good most of the time. Has started minimal exercise, stand or sits in front of a window 30 mins every day which has helped tremendously. Still  isolates but b/c covid not depression. ADLs and personal hygiene are nl. No SI/HI.   Husband is about the same. She's sole care-giver, is using all the resources she can to help him. Is in a support group through [place], which helps. Will be having a neuropsychological sometime soon.   Anxiety is controlled with the Xanax.  She would like to keep weaning down and someday off of the Xanax.  But right now the Xanax is helping control the situational anxiety.    She's trying to gain some weight.  She does have a phobia about certain foods which she is trying to overcome.  Is happy about that.  Does not purposely restrict calories, no binging or purging, no laxative use.  She's still in counseling with Dr. [name] which is very helpful.   Patient denies increased energy with decreased need for sleep, no increased talkativeness, no racing thoughts, no impulsivity or risky behaviors, no increased spending, no increased libido, no grandiosity, no paranoia, no AH/VH.  Denies dizziness, syncope, seizures, numbness, tingling, tremor, tics, unsteady gait, slurred speech, confusion. Denies muscle or joint pain, stiffness, or dystonia.  Individual Medical History/ Review of Systems: Changes? :Yes   will have an EGD soon.   Past medications for mental health diagnoses include: Zoloft, caused psychosis w/ hallucinations,  BuSpar, Ativan, Effexor (became psychotic) She was told to never take any kind of SSRI/SNRI again. Wellbutrin caused agitation.  Allergies: Albuterol, Cephalosporins, Flagyl [metronidazole], Macrobid [nitrofurantoin macrocrystal], Neomy-bacit-polymyx-pramoxine, Sulfa antibiotics, Vancomycin, Vicodin [hydrocodone-acetaminophen], Amoxicillin, Ciprofloxacin, Latex, Bacitracin, Bacitracin-neomycin-polymyxin, Nitrofurantoin, Tobramycin, Decongest-aid [pseudoephedrine], Eggshell membrane (chicken) [egg  shells], Hydrocodone-acetaminophen, Levofloxacin, Prednisone, and Zithromax  [azithromycin]  Current Medications:  Current Outpatient Medications:    ALPRAZolam (XANAX) 0.5 MG tablet, TAKE 1.5 PILLS IN MORNING, 1.5 A PILLS AT LUNCH, 1 AT DINNER, AND 1.5 PILLS AT BEDTIME, ALL AS NEED, Disp: 165 tablet, Rfl: 5   Artificial Tear Ointment (REFRESH P.M. OP), Place 1 application into both eyes at bedtime. Apply eye gel to both eyelids, Disp: , Rfl:    atenolol (TENORMIN) 25 MG tablet, Take 12.5 mg by mouth 2 (two) times daily. , Disp: , Rfl:    budesonide (PULMICORT) 180 MCG/ACT inhaler, Inhale 2 puffs into the lungs 2 (two) times daily. , Disp: , Rfl:    Cholecalciferol (VITAMIN D3) 10 MCG (400 UNIT) tablet, Take by mouth., Disp: , Rfl:    dicyclomine (BENTYL) 20 MG tablet, Take 1 tablet (20 mg total) by mouth 2 (two) times daily., Disp: 20 tablet, Rfl: 0   fluticasone (FLONASE) 50 MCG/ACT nasal spray, Place 1 spray into both nostrils daily as needed for allergies., Disp: , Rfl: 6   ipratropium (ATROVENT HFA) 17 MCG/ACT inhaler, Inhale 2 puffs into the lungs 4 (four) times daily., Disp: , Rfl:    Multiple Vitamin (MULTIVITAMIN WITH MINERALS) TABS, Take 1 tablet by mouth daily., Disp: , Rfl:    mupirocin ointment (BACTROBAN) 2 %, APPLY TO AFFECTED AREA 3 TIMES A DAY, Disp: , Rfl:    pantoprazole (PROTONIX) 40 MG tablet, Take 40 mg by mouth daily as needed (GERD). For stomach, Disp: , Rfl:    Polyvinyl Alcohol-Povidone (REFRESH OP), Place 1 drop into both eyes daily as needed (dry eyes)., Disp: , Rfl:    ZIRGAN 0.15 % GEL, APPLY 1 A SMALL AMOUNT IN AFFECTED EYE 5 TIMES A DAY UNTIL DIRECTED, Disp: , Rfl:    amitriptyline (ELAVIL) 25 MG tablet, Take 0.5 tablets (12.5 mg total) by mouth at bedtime. (Patient not taking: Reported on 10/25/2021), Disp: 30 tablet, Rfl: 1   aspirin 81 MG chewable tablet, Chew 81 mg by mouth daily. (Patient not taking: Reported on 08/06/2021), Disp: , Rfl:    COVID-19 mRNA bivalent vaccine, Pfizer, (PFIZER COVID-19 VAC BIVALENT) injection, Inject into the  muscle., Disp: 0.3 mL, Rfl: 0 Medication Side Effects: none  Family Medical/ Social History: Changes? No  MENTAL HEALTH EXAM:  There were no vitals taken for this visit.There is no height or weight on file to calculate BMI.  General Appearance: Casual, Neat and Well Groomed  Eye Contact:  Good  Speech:  Clear and Coherent and Normal Rate  Volume:  Normal  Mood:  Euthymic  Affect:  Appropriate  Thought Process:  Goal Directed and Descriptions of Associations: Circumstantial  Orientation:  Full (Time, Place, and Person)  Thought Content: Logical   Suicidal Thoughts:  No  Homicidal Thoughts:  No  Memory:  WNL  Judgement:  Good  Insight:  Good  Psychomotor Activity:  Normal  Concentration:  Concentration: Good and Attention Span: Good  Recall:  Good  Fund of Knowledge: Good  Language: Good  Assets:  Desire for Improvement  ADL's:  Intact  Cognition: WNL  Prognosis:  Good    DIAGNOSES:    ICD-10-CM   1. 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 Caregiver stress  Z63.6     3. PTSD (post-traumatic stress disorder)  F43.10     4. Mixed obsessional thoughts and acts  F42.2  Receiving Psychotherapy: Yes w/ Dr. [name]   RECOMMENDATIONS:  PDMP reviewed. Lat Xanax filled 10/21/2021.  I provided 20 minutes of non-face-to-face time during this encounter, including time spent before and after the visit in records review, medical decision making, counseling pertinent to today's visit, and charting.  I am glad to see her doing well.  No changes at this time, however she asked if she can go ahead and decrease the Xanax if she wants.  It is fine for her to decrease the dose by 0.125 mg to 0.25 mg daily.  Do not decrease it any further than that without discussing with me.  She understands.  Continue Xanax 0.5 mg, 1.5 pills every morning, 1.5 pill at lunch, 1 pill at dinner, 1.5 at bedtime as needed.  Take that evening dose approximately 2  hours before bedtime  Continue all supplements. Continue psychotherapy with Dr. [name].  Return in 6 months  Donnal Moat, [place]

## 2021-11-03 ENCOUNTER — Ambulatory Visit: Admission: RE | Admit: 2021-11-03 | Payer: Medicare HMO | Source: Ambulatory Visit

## 2021-11-05 ENCOUNTER — Other Ambulatory Visit: Payer: Self-pay

## 2021-11-05 ENCOUNTER — Ambulatory Visit
Admission: RE | Admit: 2021-11-05 | Discharge: 2021-11-05 | Disposition: A | Discharge status: Home / Self Care | Payer: Medicare HMO | Source: Ambulatory Visit | Attending: Gastroenterology | Admitting: Gastroenterology

## 2021-11-05 DIAGNOSIS — R1013 Epigastric pain: Secondary | ICD-10-CM | POA: Insufficient documentation

## 2021-11-05 LAB — POCT I-STAT CREATININE: Creatinine, Ser: 0.7 mg/dL (ref 0.44–1.00)

## 2021-11-05 MED ORDER — IOHEXOL 300 MG/ML  SOLN
75.0000 mL | Freq: Once | INTRAMUSCULAR | Status: AC | PRN
  Administered 2021-11-05: 75 mL via INTRAVENOUS

## 2021-11-09 ENCOUNTER — Ambulatory Visit (INDEPENDENT_AMBULATORY_CARE_PROVIDER_SITE_OTHER): Payer: Medicare HMO | Admitting: Psychiatry

## 2021-11-09 DIAGNOSIS — F422 Mixed obsessional thoughts and acts: Secondary | ICD-10-CM | POA: Diagnosis not present

## 2021-11-09 DIAGNOSIS — F5089 Other specified eating disorder: Secondary | ICD-10-CM

## 2021-11-09 DIAGNOSIS — F3341 Major depressive disorder, recurrent, in partial remission: Secondary | ICD-10-CM

## 2021-11-09 DIAGNOSIS — Z636 Dependent relative needing care at home: Secondary | ICD-10-CM | POA: Diagnosis not present

## 2021-11-09 DIAGNOSIS — Z8659 Personal history of other mental and behavioral disorders: Secondary | ICD-10-CM

## 2021-11-09 DIAGNOSIS — F411 Generalized anxiety disorder: Secondary | ICD-10-CM | POA: Diagnosis not present

## 2021-11-09 NOTE — Progress Notes (Signed)
Virtual Visit via Phone Note  I connected with Sandra Burns on 11/09/21 at 11:30 AM EST by a telephone  and verified that I am speaking with the correct person using two identifiers.  Location: Patient: Home Provider: [facility]   I discussed the limitations of evaluation and management by telemedicine and the availability of in person appointments. The patient expressed understanding and agreed to proceed.  History of Present Illness:     [name] is a 66 year-old Caucasian female with a past medical history for asthma, COPD, apparent mitral valve prolapse, atrial fibrillation who had developed dyspnea and ultimately underwent evaluation in the emergency department in December of 2019  Her d-dimer was slightly positive and she underwent CT angiogram.   It was negative for pulmonary embolism but did show a Mild tree-in-bud nodularity in the right lower lobe.     She has been evaluated by pulmonary [place] who reviewed her films and found that she might have very mild bronchiectatic changes confined to the left and right upper lobe.   Patient has undergone repeat imaging in [place] which again showed some nodularity.  The question of whether she might have Mycobacterium avium was raised   Interim the patient who had not been coughing has been doing all sorts of maneuvers to try to get herself to cough up sputum including sleeping in a recliner and doing other maneuvers to get sputum up such as using a flutter valve.   She was able to get 3 sputum's for submission and 2 of the 3 did not grow any organisms the third grew Mycobacterium avium complex.   She didnot seem to cough at all except if she is trying to get sputum up at which she does cheat chiefly at night.  She has been without fevers she is without weight loss.   She did however have some dyspnea on exertion that is been going on for several months.   In talking to her during my EVISITS  I was not at all  convinced that she has clear-cut active Mycobacterium avium infection.  Certainly she did snot meet ATS guidelines definition for such infection.   I have discussed with her that even if she did have evidence of Mycobacterium infection her symptom complex to me would not be wanting which I would be pushing hard for her to be on therapy.    Her CT scan that was done in January 2020 does not show any concerning findings I am not eager to repeat CT scans and exposure to further radiation without need.  [name] has continued to have minimal cough largely in the morning after sleep and sometimes if she is in a reclined position.  She has been suffering from significant epigastric pain and had weight loss and is having an upper endoscopy tomorrow.  She has additional stress of helping care for her husband who has dementia.          Observations/Objective:  Quinn appeared to be doing well in this my first time I have seen her via video feed at least in some time that I can remember.  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  She has weight loss but this is likely related to something else such as her epigastric pain.  I have only seen weight loss with Mycobacterium Avium cause this  in context of significant other symptoms such as severe coughing chills rigors.  Continue to watch and not initiate antibiotics yet.  Epigastric pain and weight loss: She is having EGD tomorrow  Vaccine counseling: She is up-to-date on her COVID-19 vaccination and greatly appreciate her help in the patient helping obtain these vaccines.    Follow Up Instructions:    I discussed the assessment and treatment plan with the patient. The patient was provided an opportunity to ask questions and all were answered. The patient agreed with the plan and  demonstrated an understanding of the instructions.   The patient was advised to call back or seek an in-person evaluation if the symptoms worsen or if the condition fails to improve as anticipated.   I spent 42  minutes with the patient including than 50% of the time in face to face counseling of the patient regarding her Mycobacterium avium, weight loss, vaccine counseling, personally reviewing CT of the lungs in January 2020 along with review of medical records in preparation for the visit and during the visit and in coordination of her care.
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
## 2021-11-10 ENCOUNTER — Other Ambulatory Visit: Payer: Self-pay

## 2021-11-10 ENCOUNTER — Telehealth (INDEPENDENT_AMBULATORY_CARE_PROVIDER_SITE_OTHER): Payer: Medicare HMO | Admitting: Infectious Disease

## 2021-11-10 ENCOUNTER — Encounter: Payer: Self-pay | Admitting: Gastroenterology

## 2021-11-10 ENCOUNTER — Encounter: Payer: Self-pay | Admitting: Infectious Disease

## 2021-11-10 DIAGNOSIS — R634 Abnormal weight loss: Secondary | ICD-10-CM

## 2021-11-10 DIAGNOSIS — J449 Chronic obstructive pulmonary disease, unspecified: Secondary | ICD-10-CM | POA: Diagnosis not present

## 2021-11-10 DIAGNOSIS — R109 Unspecified abdominal pain: Secondary | ICD-10-CM

## 2021-11-10 DIAGNOSIS — R1013 Epigastric pain: Secondary | ICD-10-CM | POA: Diagnosis not present

## 2021-11-10 DIAGNOSIS — A310 Pulmonary mycobacterial infection: Secondary | ICD-10-CM | POA: Diagnosis not present

## 2021-11-10 HISTORY — DX: Abnormal weight loss: R63.4

## 2021-11-10 HISTORY — DX: Unspecified abdominal pain: R10.9

## 2021-11-10 NOTE — H&P (Signed)
Pre-Procedure H&P   [name] is a 66 y.o. female.  Gastroenterology Provider: Annamaria Helling, DO  PCP: Tracie Harrier, MD  Date: 11/11/2021  HPI Ms. Sandra Burns is a 66 y.o. female who presents today for Esophagogastroduodenoscopy for abdominal pain and weight loss.  Patient seen in the office with chronic abdominal pain.  Known history of IBS mixed with varying constipation diarrhea.  Recent CT scan was negative.  Repeat celiac testing was negative.  Last colon over 10 years ago but patient declined repeat colonoscopy at her recent visit.  She notes 10 pound weight loss.  Abdominal pain is manageable today.  EGD to evaluate for structural abnormalities and etiology of weight loss and abdominal pain.  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   Osteoporosis    Paroxysmal A-fib (HCC)    Psoriatic arthritis (HCC)    Raynaud's disease    Reactive airway disease    S/P chemotherapy, time since greater than 12 weeks 1984   cervical ca   Seizure (HCC)    Sjogren's syndrome ([place])    Vaccine counseling 12/09/2020   Weight loss 11/10/2021    Past Surgical History:  Procedure Laterality Date   ABDOMINAL HYSTERECTOMY     BACK SURGERY     BREAST BIOPSY Left 20+ YRS AGO   EXCISIONAL - NEG   CHOLECYSTECTOMY     OOPHORECTOMY     TUBAL LIGATION      Family History No h/o GI disease or  malignancy  Review of Systems  Constitutional:  Positive for appetite change and unexpected weight change. Negative for activity change, chills, diaphoresis, fatigue and fever.  HENT:  Negative for trouble swallowing and voice change.   Respiratory:  Negative for shortness of breath and wheezing.   Cardiovascular:  Negative for chest pain, palpitations and leg swelling.  Gastrointestinal:  Positive for abdominal distention, abdominal pain, constipation and diarrhea. Negative for anal bleeding, blood in stool, nausea, rectal pain and vomiting.  Musculoskeletal:  Negative for arthralgias and myalgias.  Skin:  Negative for color change and pallor.  Neurological:  Negative for dizziness, syncope and weakness.  Psychiatric/Behavioral:  Negative for confusion.   All other systems reviewed and are negative.   Medications No current facility-administered medications on file prior to encounter.   Current Outpatient Medications on File Prior to Encounter  Medication Sig Dispense Refill   acetaminophen (TYLENOL) 500 MG tablet Take 500 mg by mouth every 6 (six) hours as needed.     atenolol (TENORMIN) 25 MG tablet Take 12.5 mg by mouth 2 (two) times daily.      gabapentin (NEURONTIN) 100 MG capsule Take 100 mg by mouth at bedtime as needed.     levalbuterol (XOPENEX HFA) 45 MCG/ACT inhaler Inhale 2 puffs into the lungs every 4 (four)  hours as needed for wheezing.     Artificial Tear Ointment (REFRESH P.M. OP) Place 1 application into both eyes at bedtime. Apply eye gel to both eyelids     aspirin 81 MG chewable tablet Chew 81 mg by mouth daily.     budesonide (PULMICORT) 180 MCG/ACT inhaler Inhale 2 puffs into the lungs 2 (two) times daily.      Cholecalciferol (VITAMIN D3) 10 MCG (400 UNIT) tablet Take by mouth.     COVID-19 mRNA bivalent vaccine, Pfizer, (PFIZER COVID-19 VAC BIVALENT) injection Inject into the muscle. 0.3 mL 0   dicyclomine (BENTYL) 20 MG tablet Take 1 tablet (20 mg total) by mouth  2 (two) times daily. 20 tablet 0   fluticasone (FLONASE) 50 MCG/ACT nasal spray Place 1 spray into both nostrils daily as needed for allergies.  6   ipratropium (ATROVENT HFA) 17 MCG/ACT inhaler Inhale 2 puffs into the lungs 4 (four) times daily.     Multiple Vitamin (MULTIVITAMIN WITH MINERALS) TABS Take 1 tablet by mouth daily.     mupirocin ointment (BACTROBAN) 2 % APPLY TO AFFECTED AREA 3 TIMES A DAY     pantoprazole (PROTONIX) 40 MG tablet Take 40 mg by mouth daily as needed (GERD). For stomach     Polyvinyl Alcohol-Povidone (REFRESH OP) Place 1 drop into both eyes daily as needed (dry eyes).     ZIRGAN 0.15 % GEL APPLY 1 A SMALL AMOUNT IN AFFECTED EYE 5 TIMES A DAY UNTIL DIRECTED      Pertinent medications related to GI and procedure were reviewed by me with the patient prior to the procedure   Current Facility-Administered Medications:    0.9 %  sodium chloride infusion, , Intravenous, Continuous, Annamaria Helling, DO, Last Rate: 20 mL/hr at 11/11/21 0817, New Bag at 11/11/21 8295      Allergies  Allergen Reactions   Albuterol Other (See Comments) and Palpitations    Atrial fibrillation Atrial fibrillation    Cephalosporins Itching and Rash    Rash and itching   Flagyl [Metronidazole] Dermatitis and Other (See Comments)    Other Reaction: SKIN SLOUGHING   Macrobid [Nitrofurantoin Macrocrystal] Itching   Neomy-Bacit-Polymyx-Pramoxine Itching and Swelling    Pt got the issue for the eye lids after instill the eyes drops.    Penicillins Anaphylaxis   Sulfa Antibiotics Itching   Vancomycin Shortness Of Breath    Other reaction(s): Unknown   Vicodin [Hydrocodone-Acetaminophen] Shortness Of Breath   Amoxicillin Itching and Swelling    She had intense itching and a rash see updated answers to questions below  Has patient had a PCN reaction causing immediate rash, facial/tongue/throat swelling, SOB or lightheadedness with hypotension: No Has patient had a PCN reaction  causing severe rash involving mucus membranes or skin necrosis: No Has patient had a PCN reaction that required hospitalization: No Has patient had a PCN reaction occurring within the last 10 years: No If all of the above answers are "NO", then may proceed with Ceph   Ciprofloxacin Swelling    Arm swelled up bright red , rash, itching   Latex Rash   Bacitracin    Bacitracin-Neomycin-Polymyxin Itching and Swelling    Pt got the issue for the eye lids after instill the eyes drops.    Nitrofurantoin     Other reaction(s): Unknown   Tobramycin Itching and Swelling    Pt got the issue after instill the eyes drops   Decongest-Aid [Pseudoephedrine] Palpitations   Eggshell Membrane (Chicken) [Egg  Shells] Rash   Hydrocodone-Acetaminophen Itching   Levofloxacin Rash    Itchy, swelling   Other Palpitations    BARIUM CONTRAST   Prednisone Palpitations   Zithromax [Azithromycin] Other (See Comments)    GI upset, C-diff   Allergies were reviewed by me prior to the procedure  Objective    Vitals:   11/11/21 0748  BP: (!) 151/80  Pulse: (!) 58  Resp: 18  Temp: (!) 97.3 F (36.3 C)  TempSrc: Temporal  SpO2: 100%  Weight: 44.5 kg  Height: _0  (1.6 m)     Physical Exam Vitals and nursing note reviewed.  Constitutional:      General: She is not in acute distress.    Appearance: She is not ill-appearing, toxic-appearing or diaphoretic.     Comments: Thin appearing  HENT:     Head: Normocephalic and atraumatic.     Nose: Nose normal.     Mouth/Throat:     Mouth: Mucous membranes are moist.     Pharynx: Oropharynx is clear.  Eyes:     General: No scleral icterus.    Extraocular Movements: Extraocular movements intact.  Cardiovascular:     Rate and Rhythm: Regular rhythm. Bradycardia present.     Heart sounds: Normal heart sounds. No murmur heard.   No friction rub. No gallop.  Pulmonary:     Effort: Pulmonary effort is normal. No respiratory distress.     Breath sounds:  Normal breath sounds. No wheezing, rhonchi or rales.  Abdominal:     General: Abdomen is flat. Bowel sounds are normal. There is no distension.     Palpations: Abdomen is soft.     Tenderness: There is no abdominal tenderness. There is no guarding or rebound.  Musculoskeletal:     Cervical back: Neck supple.     Right lower leg: No edema.     Left lower leg: No edema.  Skin:    General: Skin is warm and dry.     Coloration: Skin is not jaundiced or pale.  Neurological:     General: No focal deficit present.     Mental Status: She is alert and oriented to person, place, and time. Mental status is at baseline.  Psychiatric:        Mood and Affect: Mood normal.        Behavior: Behavior normal.        Thought Content: Thought content normal.        Judgment: Judgment normal.     Assessment:  Ms. Sandra Burns is a 66 y.o. female  who presents today for Esophagogastroduodenoscopy for abdominal pain and weight loss..  Plan:  Esophagogastroduodenoscopy with possible intervention today  Esophagogastroduodenoscopy with possible biopsy, control of bleeding, polypectomy, and interventions as necessary has been discussed with the patient/patient representative. Informed consent was obtained from the patient/patient representative after explaining the indication, nature, and risks of the procedure including but not limited to death, bleeding, perforation, missed neoplasm/lesions, cardiorespiratory compromise, and reaction to medications. Opportunity for questions was given and appropriate answers were provided. Patient/patient representative has verbalized understanding is amenable to undergoing the procedure.   Annamaria Helling, DO  [facility]  Portions of the record may have been created with voice recognition software. Occasional wrong-word or 'sound-a-like' substitutions may have occurred due to the inherent limitations of voice recognition software.  Read  the chart carefully and recognize, using context, where substitutions may have occurred.

## 2021-11-11 ENCOUNTER — Encounter: Payer: Self-pay | Admitting: Gastroenterology

## 2021-11-11 ENCOUNTER — Ambulatory Visit: Payer: Medicare HMO | Admitting: Anesthesiology

## 2021-11-11 ENCOUNTER — Ambulatory Visit
Admission: RE | Admit: 2021-11-11 | Discharge: 2021-11-11 | Disposition: A | Discharge status: Home / Self Care | Payer: Medicare HMO | Attending: Gastroenterology | Admitting: Gastroenterology

## 2021-11-11 ENCOUNTER — Encounter
Admission: RE | Disposition: A | Discharge status: Home / Self Care | Payer: Self-pay | Source: Home / Self Care | Attending: Gastroenterology

## 2021-11-11 DIAGNOSIS — K317 Polyp of stomach and duodenum: Secondary | ICD-10-CM | POA: Diagnosis not present

## 2021-11-11 DIAGNOSIS — I480 Paroxysmal atrial fibrillation: Secondary | ICD-10-CM | POA: Diagnosis not present

## 2021-11-11 DIAGNOSIS — K2950 Unspecified chronic gastritis without bleeding: Secondary | ICD-10-CM | POA: Diagnosis not present

## 2021-11-11 DIAGNOSIS — L4050 Arthropathic psoriasis, unspecified: Secondary | ICD-10-CM | POA: Diagnosis not present

## 2021-11-11 DIAGNOSIS — J449 Chronic obstructive pulmonary disease, unspecified: Secondary | ICD-10-CM | POA: Diagnosis not present

## 2021-11-11 DIAGNOSIS — Z87891 Personal history of nicotine dependence: Secondary | ICD-10-CM | POA: Diagnosis not present

## 2021-11-11 DIAGNOSIS — R001 Bradycardia, unspecified: Secondary | ICD-10-CM | POA: Insufficient documentation

## 2021-11-11 DIAGNOSIS — K219 Gastro-esophageal reflux disease without esophagitis: Secondary | ICD-10-CM | POA: Insufficient documentation

## 2021-11-11 DIAGNOSIS — G8929 Other chronic pain: Secondary | ICD-10-CM | POA: Insufficient documentation

## 2021-11-11 DIAGNOSIS — M3500 Sicca syndrome, unspecified: Secondary | ICD-10-CM | POA: Diagnosis not present

## 2021-11-11 DIAGNOSIS — K269 Duodenal ulcer, unspecified as acute or chronic, without hemorrhage or perforation: Secondary | ICD-10-CM | POA: Insufficient documentation

## 2021-11-11 DIAGNOSIS — R1013 Epigastric pain: Secondary | ICD-10-CM | POA: Insufficient documentation

## 2021-11-11 DIAGNOSIS — M797 Fibromyalgia: Secondary | ICD-10-CM | POA: Diagnosis not present

## 2021-11-11 HISTORY — PX: ESOPHAGOGASTRODUODENOSCOPY: SHX5428

## 2021-11-11 HISTORY — DX: Sjogren syndrome, unspecified: M35.00

## 2021-11-11 HISTORY — DX: Unspecified convulsions: R56.9

## 2021-11-11 HISTORY — DX: Depression, unspecified: F32.A

## 2021-11-11 HISTORY — DX: Cardiac murmur, unspecified: R01.1

## 2021-11-11 HISTORY — DX: Anxiety disorder, unspecified: F41.9

## 2021-11-11 SURGERY — EGD (ESOPHAGOGASTRODUODENOSCOPY)
Anesthesia: General

## 2021-11-11 MED ORDER — LIDOCAINE HCL (PF) 2 % IJ SOLN
INTRAMUSCULAR | Status: AC
  Filled 2021-11-11: qty 5

## 2021-11-11 MED ORDER — SODIUM CHLORIDE 0.9 % IV SOLN: INTRAVENOUS | Status: DC

## 2021-11-11 MED ORDER — PROPOFOL 10 MG/ML IV BOLUS
INTRAVENOUS | Status: AC
  Filled 2021-11-11: qty 20

## 2021-11-11 MED ORDER — LIDOCAINE HCL (CARDIAC) PF 100 MG/5ML IV SOSY
PREFILLED_SYRINGE | INTRAVENOUS | Status: DC | PRN
Start: 2021-11-11 — End: 2021-11-11
  Administered 2021-11-11: 100 mg via INTRAVENOUS

## 2021-11-11 MED ORDER — PROPOFOL 10 MG/ML IV BOLUS
INTRAVENOUS | Status: DC | PRN
  Administered 2021-11-11: 100 mg via INTRAVENOUS
  Administered 2021-11-11 (×2): 20 mg via INTRAVENOUS

## 2021-11-11 NOTE — Transfer of Care (Signed)
Immediate Anesthesia Transfer of Care Note  Patient: Sandra Burns  Procedure(s) Performed: ESOPHAGOGASTRODUODENOSCOPY (EGD)  Patient Location: PACU  Anesthesia Type:General  Level of Consciousness: drowsy  Airway & Oxygen Therapy: Patient Spontanous Breathing and Patient connected to nasal cannula oxygen  Post-op Assessment: Report given to RN and Post -op Vital signs reviewed and stable  Post vital signs: Reviewed and stable  Last Vitals:  Vitals Value Taken Time  BP 104/56 11/11/21 0843  Temp 36.3 C 11/11/21 0843  Pulse 64 11/11/21 0844  Resp 11 11/11/21 0844  SpO2 100 % 11/11/21 0844  Vitals shown include unvalidated device data.  Last Pain:  Vitals:   11/11/21 0843  TempSrc: Temporal  PainSc: Asleep         Complications: No notable events documented.

## 2021-11-11 NOTE — Anesthesia Preprocedure Evaluation (Signed)
Anesthesia Evaluation  Patient identified by MRN, date of birth, ID band Patient awake    Reviewed: Allergy & Precautions, NPO status , Patient's Chart, lab work & pertinent test results  History of Anesthesia Complications Negative for: history of anesthetic complications  Airway Mallampati: III  TM Distance: <3 FB Neck ROM: full    Dental  (+) Chipped   Pulmonary neg shortness of breath, asthma , COPD,  COPD inhaler, former smoker,    Pulmonary exam normal        Cardiovascular (-) anginaNormal cardiovascular exam+ Valvular Problems/Murmurs      Neuro/Psych  Headaches, Seizures -, Well Controlled,  PSYCHIATRIC DISORDERS  Neuromuscular disease negative psych ROS   GI/Hepatic Neg liver ROS, GERD  Controlled,  Endo/Other  negative endocrine ROS  Renal/GU Renal disease  negative genitourinary   Musculoskeletal  (+) Arthritis , Fibromyalgia -  Abdominal   Peds  Hematology negative hematology ROS (+)   Anesthesia Other Findings Past Medical History: 11/10/2021: Abdominal pain 03/19/2019: ABPA (allergic bronchopulmonary aspergillosis) ([place]) No date: Anxiety No date: Arrhythmia No date: Arthritis No date: Asthma No date: Back pain 09/04/2019: Bronchiectasis ([place]) No date: Chronic obstructive airway disease (HCC) No date: Depression No date: Diverticulosis No date: Fibromyalgia No date: Fibromyalgia No date: GERD (gastroesophageal reflux disease) No date: Heart murmur No date: Irritable bowel syndrome No date: Kidney stone No date: Lupus ([place])     Comment:  "suspected" No date: Migraine No date: Mitral valve prolapse 09/04/2019: Multiple allergies 12/25/2018: Mycobacterium avium complex ([place]) No date: Osteoporosis No date: Paroxysmal A-fib ([place]) No date: Psoriatic arthritis ([place]) No date: Raynaud's disease No date: Reactive airway disease 1984: S/P chemotherapy, time since greater than 12 weeks      Comment:  cervical ca No date: Seizure ([place]) No date: Sjogren's syndrome ([place]) 12/09/2020: Vaccine counseling 11/10/2021: Weight loss  Past Surgical History: No date: ABDOMINAL HYSTERECTOMY No date: BACK SURGERY 20+ YRS AGO: BREAST BIOPSY; Left     Comment:  EXCISIONAL - NEG No date: CHOLECYSTECTOMY No date: OOPHORECTOMY No date: TUBAL LIGATION  BMI    Body Mass Index: 17.36 kg/m      Reproductive/Obstetrics negative OB ROS                             Anesthesia Physical Anesthesia Plan  ASA: 3  Anesthesia Plan: General   Post-op Pain Management:    Induction: Intravenous  PONV Risk Score and Plan: Propofol infusion and TIVA  Airway Management Planned: Natural Airway and Nasal Cannula  Additional Equipment:   Intra-op Plan:   Post-operative Plan:   Informed Consent: I have reviewed the patients History and Physical, chart, labs and discussed the procedure including the risks, benefits and alternatives for the proposed anesthesia with the patient or authorized representative who has indicated his/her understanding and acceptance.     Dental Advisory Given  Plan Discussed with: Anesthesiologist, CRNA and Surgeon  Anesthesia Plan Comments: (Patient consented for risks of anesthesia including but not limited to:  - adverse reactions to medications - risk of airway placement if required - damage to eyes, teeth, lips or other oral mucosa - nerve damage due to positioning  - sore throat or hoarseness - Damage to heart, brain, nerves, lungs, other parts of body or loss of life  Patient voiced understanding.)        Anesthesia Quick Evaluation

## 2021-11-11 NOTE — Anesthesia Postprocedure Evaluation (Signed)
Anesthesia Post Note  Patient: [name]  Procedure(s) Performed: ESOPHAGOGASTRODUODENOSCOPY (EGD)  Patient location during evaluation: Endoscopy Anesthesia Type: General Level of consciousness: awake and alert Pain management: pain level controlled Vital Signs Assessment: post-procedure vital signs reviewed and stable Respiratory status: spontaneous breathing, nonlabored ventilation, respiratory function stable and patient connected to nasal cannula oxygen Cardiovascular status: blood pressure returned to baseline and stable Postop Assessment: no apparent nausea or vomiting Anesthetic complications: no   No notable events documented.   Last Vitals:  Vitals:   11/11/21 0843 11/11/21 0853  BP: (!) 104/56 135/69  Pulse: 64   Resp: 11 12  Temp: (!) 36.3 C   SpO2: 100%     Last Pain:  Vitals:   11/11/21 0903  TempSrc:   PainSc: 0-No pain                 Cleda Mccreedy Rajvir Ernster

## 2021-11-11 NOTE — Op Note (Signed)
[facility] Gastroenterology Patient Name: Sandra Burns Procedure Date: 11/11/2021 8:16 AM MRN: [id number] Account #: [id number] Date of Birth: 04-12-1956 Admit Type: Outpatient Age: 66 Room: [facility] ENDO ROOM 1 Gender: Female Note Status: Finalized Instrument Name: Upper Endoscope [id number] Procedure:             Upper GI endoscopy Indications:           Epigastric abdominal pain Providers:             Annamaria Helling DO, DO Referring MD:          Tracie Harrier, MD (Referring MD) Medicines:             Monitored Anesthesia Care Complications:         No immediate complications. Estimated blood loss:                         Minimal. Procedure:             Pre-Anesthesia Assessment:                        - Prior to the procedure, a History and Physical was                         performed, and patient medications and allergies were                         reviewed. The patient is competent. The risks and                         benefits of the procedure and the sedation options and                         risks were discussed with the patient. All questions                         were answered and informed consent was obtained.                         Patient identification and proposed procedure were                         verified by the physician, the nurse, the anesthetist                         and the technician in the endoscopy suite. Mental                         Status Examination: alert and oriented. Airway                         Examination: normal oropharyngeal airway and neck                         mobility. Respiratory Examination: clear to                         auscultation. CV Examination: RRR, no murmurs, no S3  or S4. Prophylactic Antibiotics: The patient does not                         require prophylactic antibiotics. Prior                         Anticoagulants: The patient has taken no previous                          anticoagulant or antiplatelet agents. ASA Grade                         Assessment: III - A patient with severe systemic                         disease. After reviewing the risks and benefits, the                         patient was deemed in satisfactory condition to                         undergo the procedure. The anesthesia plan was to use                         monitored anesthesia care (MAC). Immediately prior to                         administration of medications, the patient was                         re-assessed for adequacy to receive sedatives. The                         heart rate, respiratory rate, oxygen saturations,                         blood pressure, adequacy of pulmonary ventilation, and                         response to care were monitored throughout the                         procedure. The physical status of the patient was                         re-assessed after the procedure.                        After obtaining informed consent, the endoscope was                         passed under direct vision. Throughout the procedure,                         the patient's blood pressure, pulse, and oxygen                         saturations were monitored continuously. The  Endosonoscope was introduced through the mouth, and                         advanced to the second part of duodenum. The upper GI                         endoscopy was accomplished without difficulty. The                         patient tolerated the procedure well. Findings:      A few localized erosions without bleeding were found in the duodenal       bulb. Biopsies for histology were taken with a cold forceps for       evaluation of celiac disease. Estimated blood loss was minimal.      The exam of the duodenum was otherwise normal.      Localized minimal inflammation characterized by erosions was found in       the gastric antrum. Biopsies were  taken with a cold forceps for       Helicobacter pylori testing. Estimated blood loss was minimal.      A single 1 to 2 mm sessile polyp with no bleeding and no stigmata of       recent bleeding was found on the greater curvature of the stomach. The       polyp was removed with a cold biopsy forceps. Resection and retrieval       were complete. Estimated blood loss was minimal.      The exam of the stomach was otherwise normal.      The Z-line was regular. Estimated blood loss: none.      Esophagogastric landmarks were identified: the gastroesophageal junction       was found at 36 cm from the incisors.      The exam of the esophagus was otherwise normal. Impression:            - Duodenal erosions without bleeding. Biopsied.                        - Gastritis. Biopsied.                        - A single gastric polyp. Resected and retrieved.                        - Z-line regular.                        - Esophagogastric landmarks identified. Recommendation:        - Discharge patient to home.                        - Resume previous diet.                        - No aspirin, ibuprofen, naproxen, or other                         non-steroidal anti-inflammatory drugs.                        - Continue/Resume acid suppressive therapy like proton  pump inhibitor                        - Continue present medications.                        - Await pathology results.                        - Return to GI clinic as previously scheduled.                        - The findings and recommendations were discussed with                         the patient. Procedure Code(s):     --- Professional ---                        [phone], Esophagogastroduodenoscopy, flexible,                         transoral; with biopsy, single or multiple Diagnosis Code(s):     --- Professional ---                        K26.9, Duodenal ulcer, unspecified as acute or                          chronic, without hemorrhage or perforation                        K29.70, Gastritis, unspecified, without bleeding                        K31.7, Polyp of stomach and duodenum                        R10.13, Epigastric pain CPT copyright 2019 American Medical Association. All rights reserved. The codes documented in this report are preliminary and upon coder review may  be revised to meet current compliance requirements. Attending Participation:      I personally performed the entire procedure. Volney American, DO Annamaria Helling DO, DO 11/11/2021 8:42:20 AM This report has been signed electronically. Number of Addenda: 0 Note Initiated On: 11/11/2021 8:16 AM Estimated Blood Loss:  Estimated blood loss was minimal.      [facility]

## 2021-11-11 NOTE — Interval H&P Note (Signed)
History and Physical Interval Note: Preprocedure H&P from 11/11/21  was reviewed and there was no interval change after seeing and examining the patient.  Written consent was obtained from the patient after discussion of risks, benefits, and alternatives. Patient has consented to proceed with Esophagogastroduodenoscopy with possible intervention   11/11/2021 8:21 AM  Sandra Burns  has presented today for surgery, with the diagnosis of Epigastric pain (R10.13).  The various methods of treatment have been discussed with the patient and family. After consideration of risks, benefits and other options for treatment, the patient has consented to  Procedure(s): ESOPHAGOGASTRODUODENOSCOPY (EGD) (N/A) as a surgical intervention.  The patient's history has been reviewed, patient examined, no change in status, stable for surgery.  I have reviewed the patient's chart and labs.  Questions were answered to the patient's satisfaction.     [name]

## 2021-11-12 ENCOUNTER — Encounter: Payer: Self-pay | Admitting: Gastroenterology

## 2021-11-12 LAB — SURGICAL PATHOLOGY

## 2021-11-13 ENCOUNTER — Encounter: Payer: Self-pay | Admitting: Infectious Disease

## 2021-11-15 ENCOUNTER — Ambulatory Visit: Payer: Medicare HMO

## 2021-11-17 ENCOUNTER — Encounter (HOSPITAL_BASED_OUTPATIENT_CLINIC_OR_DEPARTMENT_OTHER): Payer: Self-pay | Admitting: Urology

## 2021-11-17 ENCOUNTER — Emergency Department (HOSPITAL_BASED_OUTPATIENT_CLINIC_OR_DEPARTMENT_OTHER): Payer: Medicare HMO

## 2021-11-17 ENCOUNTER — Emergency Department (HOSPITAL_BASED_OUTPATIENT_CLINIC_OR_DEPARTMENT_OTHER)
Admission: EM | Admit: 2021-11-17 | Discharge: 2021-11-17 | Disposition: A | Discharge status: Home / Self Care | Payer: Medicare HMO | Attending: Emergency Medicine | Admitting: Emergency Medicine

## 2021-11-17 ENCOUNTER — Other Ambulatory Visit: Payer: Self-pay

## 2021-11-17 DIAGNOSIS — Z91040 Latex allergy status: Secondary | ICD-10-CM | POA: Diagnosis not present

## 2021-11-17 DIAGNOSIS — R109 Unspecified abdominal pain: Secondary | ICD-10-CM

## 2021-11-17 DIAGNOSIS — J449 Chronic obstructive pulmonary disease, unspecified: Secondary | ICD-10-CM | POA: Insufficient documentation

## 2021-11-17 DIAGNOSIS — K589 Irritable bowel syndrome without diarrhea: Secondary | ICD-10-CM | POA: Diagnosis not present

## 2021-11-17 DIAGNOSIS — R1032 Left lower quadrant pain: Secondary | ICD-10-CM | POA: Diagnosis present

## 2021-11-17 DIAGNOSIS — Z7951 Long term (current) use of inhaled steroids: Secondary | ICD-10-CM | POA: Insufficient documentation

## 2021-11-17 DIAGNOSIS — Z7982 Long term (current) use of aspirin: Secondary | ICD-10-CM | POA: Insufficient documentation

## 2021-11-17 LAB — CBC WITH DIFFERENTIAL/PLATELET
Abs Immature Granulocytes: 0.02 10*3/uL (ref 0.00–0.07)
Basophils Absolute: 0.1 10*3/uL (ref 0.0–0.1)
Basophils Relative: 1 %
Eosinophils Absolute: 0.1 10*3/uL (ref 0.0–0.5)
Eosinophils Relative: 1 %
HCT: 43.3 % (ref 36.0–46.0)
Hemoglobin: 14.1 g/dL (ref 12.0–15.0)
Immature Granulocytes: 0 %
Lymphocytes Relative: 40 %
Lymphs Abs: 2.7 10*3/uL (ref 0.7–4.0)
MCH: 30.3 pg (ref 26.0–34.0)
MCHC: 32.6 g/dL (ref 30.0–36.0)
MCV: 92.9 fL (ref 80.0–100.0)
Monocytes Absolute: 0.6 10*3/uL (ref 0.1–1.0)
Monocytes Relative: 9 %
Neutro Abs: 3.3 10*3/uL (ref 1.7–7.7)
Neutrophils Relative %: 49 %
Platelets: 257 10*3/uL (ref 150–400)
RBC: 4.66 MIL/uL (ref 3.87–5.11)
RDW: 12.8 % (ref 11.5–15.5)
WBC: 6.7 10*3/uL (ref 4.0–10.5)
nRBC: 0 % (ref 0.0–0.2)

## 2021-11-17 LAB — COMPREHENSIVE METABOLIC PANEL
ALT: 11 U/L (ref 0–44)
AST: 20 U/L (ref 15–41)
Albumin: 3.8 g/dL (ref 3.5–5.0)
Alkaline Phosphatase: 62 U/L (ref 38–126)
Anion gap: 10 (ref 5–15)
BUN: 7 mg/dL — ABNORMAL LOW (ref 8–23)
CO2: 26 mmol/L (ref 22–32)
Calcium: 9.3 mg/dL (ref 8.9–10.3)
Chloride: 100 mmol/L (ref 98–111)
Creatinine, Ser: 0.61 mg/dL (ref 0.44–1.00)
GFR, Estimated: >60 mL/min (ref >60)
Glucose, Bld: 81 mg/dL (ref 70–99)
Potassium: 4.1 mmol/L (ref 3.5–5.1)
Sodium: 136 mmol/L (ref 135–145)
Total Bilirubin: 1.1 mg/dL (ref 0.3–1.2)
Total Protein: 7 g/dL (ref 6.5–8.1)

## 2021-11-17 LAB — URINALYSIS, ROUTINE W REFLEX MICROSCOPIC
Bilirubin Urine: NEGATIVE
Glucose, UA: NEGATIVE mg/dL
Hgb urine dipstick: NEGATIVE
Ketones, ur: NEGATIVE mg/dL
Leukocytes,Ua: NEGATIVE
Nitrite: NEGATIVE
Protein, ur: NEGATIVE mg/dL
Specific Gravity, Urine: 1.005 (ref 1.005–1.030)
pH: 6.5 (ref 5.0–8.0)

## 2021-11-17 LAB — LIPASE, BLOOD: Lipase: 37 U/L (ref 11–51)

## 2021-11-17 MED ORDER — IOHEXOL 300 MG/ML  SOLN
60.0000 mL | Freq: Once | INTRAMUSCULAR | Status: AC | PRN
  Administered 2021-11-17: 60 mL via INTRAVENOUS

## 2021-11-17 MED ORDER — IOHEXOL 300 MG/ML  SOLN: 75.0000 mL | Freq: Once | INTRAMUSCULAR | Status: DC | PRN

## 2021-11-17 NOTE — Telephone Encounter (Signed)
I spoke with the patient and she has spoke with the court, which they have now excused her from jury duty due to her husband having dementia and she is unable to leave him. Sandra Burns Jonathon Resides, CMA

## 2021-11-17 NOTE — Discharge Instructions (Signed)
Continue your current medications.  Consider following up with your OB/GYN doctor as we discussed

## 2021-11-17 NOTE — ED Provider Notes (Signed)
[facility] EMERGENCY DEPT Provider Note   CSN: [id number] Arrival date & time: 11/17/21  1223     History  Chief Complaint  Patient presents with   Abdominal Pain    Sandra Burns is a 66 y.o. female.   Abdominal Pain Associated symptoms: no fever    Patient has history of fibromyalgia, chronic obstructive airway disease, irritable bowel syndrome, diverticulosis, paroxysmal A-fib, status post abdominal hysterectomy cholecystectomy who presents with abdominal pain.  Patient states she started having symptoms over the weekend.  She is having pain in her lower abdomen.  She also started developing sharp stabbing pain in the rectal area rating up to her abdomen.  She has not had any trouble urinating.  Patient states she called her GI doctor's office.  Patient came to the ED today because of persistent symptoms. Outpatient records reviewed.  Patient had a CT scan on February 10 that showed no evidence of diverticulosis or other acute findings Home Medications Prior to Admission medications   Medication Sig Start Date End Date Taking? Authorizing Provider  acetaminophen (TYLENOL) 500 MG tablet Take 500 mg by mouth every 6 (six) hours as needed.    [provider]  ALPRAZolam (XANAX) 0.5 MG tablet TAKE 1.5 PILLS IN MORNING, 1.5 A PILLS AT LUNCH, 1 AT [place], AND 1.5 PILLS AT BEDTIME, ALL AS NEED 10/21/21   Donnal Moat T, PA-C  amitriptyline (ELAVIL) 25 MG tablet Take 0.5 tablets (12.5 mg total) by mouth at bedtime. 09/13/21   Donnal Moat T, PA-C  Artificial Tear Ointment (REFRESH P.M. OP) Place 1 application into both eyes at bedtime. Apply eye gel to both eyelids    [provider]  aspirin 81 MG chewable tablet Chew 81 mg by mouth daily.    [provider]  atenolol (TENORMIN) 25 MG tablet Take 12.5 mg by mouth 2 (two) times daily.     [provider]  budesonide (PULMICORT) 180 MCG/ACT inhaler Inhale 2 puffs into the lungs 2  (two) times daily.     [provider]  Cholecalciferol (VITAMIN D3) 10 MCG (400 UNIT) tablet Take by mouth.    [provider]  COVID-19 mRNA bivalent vaccine, Pfizer, (PFIZER COVID-19 VAC BIVALENT) injection Inject into the muscle. 06/15/21   Carlyle Basques, MD  dicyclomine (BENTYL) 20 MG tablet Take 1 tablet (20 mg total) by mouth 2 (two) times daily. 11/09/17   Dorie Rank, MD  fluticasone (FLONASE) 50 MCG/ACT nasal spray Place 1 spray into both nostrils daily as needed for allergies. 07/07/18   [provider]  gabapentin (NEURONTIN) 100 MG capsule Take 100 mg by mouth at bedtime as needed.    [provider]  ipratropium (ATROVENT HFA) 17 MCG/ACT inhaler Inhale 2 puffs into the lungs 4 (four) times daily.    [provider]  levalbuterol Penne Lash HFA) 45 MCG/ACT inhaler Inhale 2 puffs into the lungs every 4 (four) hours as needed for wheezing.    [provider]  Multiple Vitamin (MULTIVITAMIN WITH MINERALS) TABS Take 1 tablet by mouth daily.    [provider]  mupirocin ointment (BACTROBAN) 2 % APPLY TO AFFECTED AREA 3 TIMES A DAY 08/23/19   [provider]  pantoprazole (PROTONIX) 40 MG tablet Take 40 mg by mouth daily as needed (GERD). For stomach    [provider]  Polyvinyl Alcohol-Povidone (REFRESH OP) Place 1 drop into both eyes daily as needed (dry eyes).    [provider]  ZIRGAN 0.15 %  GEL APPLY 1 A SMALL AMOUNT IN AFFECTED EYE 5 TIMES A DAY UNTIL DIRECTED 06/10/19   [provider]      Allergies    Albuterol, Cephalosporins, Flagyl [metronidazole], Macrobid [nitrofurantoin macrocrystal], Neomy-bacit-polymyx-pramoxine, Penicillins, Sulfa antibiotics, Vancomycin, Vicodin [hydrocodone-acetaminophen], Amoxicillin, Ciprofloxacin, Latex, Bacitracin, Bacitracin-neomycin-polymyxin, Barium-containing compounds, Nitrofurantoin, Tobramycin, Decongest-aid [pseudoephedrine], Eggshell membrane  (chicken) [egg shells], Hydrocodone-acetaminophen, Levofloxacin, Other, Prednisone, and Zithromax [azithromycin]    Review of Systems   Review of Systems  Constitutional:  Negative for fever.  Gastrointestinal:  Positive for abdominal pain.   Physical Exam Updated Vital Signs BP 128/72    Pulse (!) 59    Temp 98.5 F (36.9 C) (Oral)    Resp 11    Ht 1.6 m (5\' 3" )    Wt 44.5 kg    SpO2 99%    BMI 17.38 kg/m  Physical Exam Vitals and nursing note reviewed.  Constitutional:      General: She is not in acute distress.    Appearance: She is well-developed.  HENT:     Head: Normocephalic and atraumatic.     Right Ear: External ear normal.     Left Ear: External ear normal.  Eyes:     General: No scleral icterus.       Right eye: No discharge.        Left eye: No discharge.     Conjunctiva/sclera: Conjunctivae normal.  Neck:     Trachea: No tracheal deviation.  Cardiovascular:     Rate and Rhythm: Normal rate and regular rhythm.  Pulmonary:     Effort: Pulmonary effort is normal. No respiratory distress.     Breath sounds: Normal breath sounds. No stridor. No wheezing or rales.  Abdominal:     General: Bowel sounds are normal. There is no distension.     Palpations: Abdomen is soft.     Tenderness: There is abdominal tenderness in the left lower quadrant. There is no guarding or rebound.  Genitourinary:    Comments: No external hemorrhoid noted, no fissure Musculoskeletal:        General: No tenderness or deformity.     Cervical back: Neck supple.  Skin:    General: Skin is warm and dry.     Findings: No rash.  Neurological:     General: No focal deficit present.     Mental Status: She is alert.     Cranial Nerves: No cranial nerve deficit (no facial droop, extraocular movements intact, no slurred speech).     Sensory: No sensory deficit.     Motor: No abnormal muscle tone or seizure activity.     Coordination: Coordination normal.  Psychiatric:        Mood and Affect:  Mood normal.    ED Results / Procedures / Treatments   Labs (all labs ordered are listed, but only abnormal results are displayed) Labs Reviewed  COMPREHENSIVE METABOLIC PANEL - Abnormal; Notable for the following components:      Result Value   BUN 7 (*)    All other components within normal limits  URINALYSIS, ROUTINE W REFLEX MICROSCOPIC - Abnormal; Notable for the following components:   Color, Urine COLORLESS (*)    All other components within normal limits  LIPASE, BLOOD  CBC WITH DIFFERENTIAL/PLATELET    EKG None  Radiology CT ABDOMEN PELVIS W CONTRAST  Result Date: 11/17/2021 CLINICAL DATA:  Lower abdominal pain, rectal pain radiating to abdomen, last BM was last night, history of diverticulosis, colitis EXAM: CT ABDOMEN  AND PELVIS WITH CONTRAST TECHNIQUE: Multidetector CT imaging of the abdomen and pelvis was performed using the standard protocol following bolus administration of intravenous contrast. RADIATION DOSE REDUCTION: This exam was performed according to the departmental dose-optimization program which includes automated exposure control, adjustment of the mA and/or kV according to patient size and/or use of iterative reconstruction technique. CONTRAST:  64mL OMNIPAQUE IOHEXOL 300 MG/ML SOLN IV. No oral contrast. COMPARISON:  11/05/2021 FINDINGS: Lower chest: Subsegmental atelectasis and probable bronchiectasis at base of lingula. Remaining lung bases clear. Hepatobiliary: Gallbladder surgically absent. Nonspecific low-attenuation focus lateral segment LEFT lobe liver 7 mm diameter unchanged since 11/05/2021 as well as an earlier study of 11/09/2017. No additional hepatic abnormalities. Pancreas: Normal appearance Spleen: Calcified granuloma within spleen. Spleen otherwise unremarkable. Adrenals/Urinary Tract: Adrenal glands normal appearance. Kidneys, ureters, and bladder normal appearance. Stomach/Bowel: Descending and sigmoid diverticulosis without CT evidence of  diverticulitis. Stomach and bowel loops otherwise normal appearance. Normal appendix. Vascular/Lymphatic: Atherosclerotic calcifications aorta and iliac arteries without aneurysm. No adenopathy. Reproductive: Uterus surgically absent. Ovaries not definitely visualized. Other: No free air or free fluid. No hernia or inflammatory process. Musculoskeletal: Scoliosis with spinal fixation rod at thoracolumbar spine. Osseous demineralization. No acute osseous findings. IMPRESSION: Distal colonic diverticulosis without evidence of diverticulitis. No acute intra-abdominal or intrapelvic abnormalities. Aortic Atherosclerosis (ICD10-I70.0). Electronically Signed   By: Lavonia Dana M.D.   On: 11/17/2021 15:18    Procedures Procedures    Medications Ordered in ED Medications  iohexol (OMNIPAQUE) 300 MG/ML solution 60 mL (60 mLs Intravenous Contrast Given 11/17/21 1503)    ED Course/ Medical Decision Making/ A&P Clinical Course as of 11/17/21 1538  Wed Nov 17, 2021  1411 CBC with Diff CBC normal [JK]  1411 Urinalysis, Routine w reflex microscopic Urine, Clean Catch(!) UA normal [JK]  1431 Comprehensive metabolic panel(!) Metabolic panel normal [JK]  1431 Lipase, blood Lipase normal [JK]  1438 Discussed findings with patient.  She is concerned about the pain that she had.  Not typical for IBS exacerbation.  Discussed options of close follow-up considering the reassuring nature of her labs versus CT scan today.  Patient is concerned and would prefer to proceed with CT scan. [JK]    Clinical Course User Index [JK] Dorie Rank, MD                           Medical Decision Making Amount and/or Complexity of Data Reviewed Labs: ordered. Decision-making details documented in ED Course. Radiology: ordered.  Risk Prescription drug management.   Patient presented to ED for evaluation of abdominal pain.  Patient has history of IBS and recurrent episodes of pain.  Patient was concerned however because the  symptoms are somewhat atypical.  Laboratory tests were reassuring.  I had a long discussion with the patient CT scan imaging versus outpatient management and close follow-up.  Patient opted for CT scan.  CT scan fortunately does not show any acute abnormalities.  At this time no signs of colitis diverticulitis.  No signs of mass or other acute abnormality.  Evaluation and diagnostic testing in the emergency department does not suggest an emergent condition requiring admission or immediate intervention beyond what has been performed at this time.  The patient is safe for discharge and has been instructed to return immediately for worsening symptoms, change in symptoms or any other concerns.         Final Clinical Impression(s) / ED Diagnoses Final diagnoses:  Abdominal pain,  unspecified abdominal location  Irritable bowel syndrome, unspecified type    Rx / DC Orders ED Discharge Orders     None         Dorie Rank, MD 11/17/21 1539

## 2021-11-17 NOTE — ED Notes (Signed)
Dc instructions reviewed with patient. Patient voiced understanding. Dc with belongings.  °

## 2021-11-17 NOTE — ED Triage Notes (Signed)
GI sent  Starting Sunday having lower abd pain H/o diverticulosis and colitis Pain started in rectum and radiating to abdomen , sharp and stabbing pain  Last BM last night  No trouble urinating

## 2021-11-17 NOTE — ED Notes (Signed)
Patient transported to CT 

## 2021-12-30 ENCOUNTER — Ambulatory Visit: Payer: Medicare HMO | Admitting: Urology

## 2022-01-04 ENCOUNTER — Ambulatory Visit (INDEPENDENT_AMBULATORY_CARE_PROVIDER_SITE_OTHER): Payer: Medicare HMO | Admitting: Psychiatry

## 2022-01-04 DIAGNOSIS — F411 Generalized anxiety disorder: Secondary | ICD-10-CM | POA: Diagnosis not present

## 2022-01-04 DIAGNOSIS — F3341 Major depressive disorder, recurrent, in partial remission: Secondary | ICD-10-CM

## 2022-01-04 DIAGNOSIS — Z8659 Personal history of other mental and behavioral disorders: Secondary | ICD-10-CM

## 2022-01-04 DIAGNOSIS — F5089 Other specified eating disorder: Secondary | ICD-10-CM | POA: Diagnosis not present

## 2022-01-04 DIAGNOSIS — E638 Other specified nutritional deficiencies: Secondary | ICD-10-CM

## 2022-01-04 DIAGNOSIS — Z636 Dependent relative needing care at home: Secondary | ICD-10-CM | POA: Diagnosis not present

## 2022-01-04 NOTE — Progress Notes (Signed)
Psychotherapy Progress Note [facility] [name], PhD LP  Patient ID: Timmya Doser)    MRN: [id number] Therapy format: Individual psychotherapy Date: 01/04/2022      Start: 3:12p     Stop: 4:18p     Time Spent: 66 min Location: Telehealth visit -- I connected with this patient by an approved telecommunication method (video), with her informed consent, and verifying identity and patient privacy.  I was located at my office and patient at her home.  As needed, we discussed the limitations, risks, and security and privacy concerns associated with telehealth service, including the availability and conditions which currently govern in-person appointments and the possibility that 3rd-party payment may not be fully guaranteed and she may be responsible for charges.  After she indicated understanding, we proceeded with the session.  Also discussed treatment planning, as needed, including ongoing verbal agreement with the plan, the opportunity to ask and answer all questions, her demonstrated understanding of instructions, and her readiness to call the office should symptoms worsen or she feels she is in a crisis state and needs more immediate and tangible assistance.   Session narrative (presenting needs, interim history, self-report of stressors and symptoms, applications of prior therapy, status changes, and interventions made in session) Last seen 8 wks ago.  EHR shows endoscopy shortly after, then ED visit for abdominal pain a week later, and suprapubic pain visit 4 wks ago.  Endoscopy did find a precancerous polyp, 3 duodenal ulcers, patches of gastritis.  Taken aback by it taking a month to get results, no treatment recommendations, and feeling like there's nothing advanced in her gastric care by doing so.  [name] had neurocognitive testing Mar 20, she had opportunity to provide observations, and before test results strong clinical suspicion of Lewy Body dementia.   After testing, results showed enough normal (barely normal) that neuropsychologist wanted to stay conservative about diagnosing.  Also seemed to suggest afterward that [name] could get better, and that his report would include suggestions what to do, when it didn't, and he ended up giving different conclusions in person and in print.  Did offer post-anesthesia cognitive decline as a possibility, but overall seemed to lowball assessment of what deficits Markel has seen.  Also evidence the neuropsychologist was too wedded to pointing to various possibilities including medication that either started after symptoms or is rarely used.  Also that he is not qualifying for diagnosis compared to his age and gender cohort but clearly has declined compared to his own past.  Also specifically asked neuropsych for referral to another neurologist, which the doctor jotted down as a self-reminder and did not follow through.  Does have referral for sleep study, and sound recommendations for vascular care and diabetes management, and she and [name] have drafted concerns and questions.  Discussed and encouraged framing her practical questions without overexplaining herself and getting clarifications asked when needed.  Angry about the collective health system decision to drop mandatory masking, given her own potentially fatal vulnerability to respiratory illness, and the millions lost to COVID anyway.  In personal improvements, has made it back personally to >= 100 lbs, reliably, despite never feeling hungry.  Started exercising 3 weeks ago, partly for fearing what happens when pandemic emergency measures expire May 11.  Could help.  Has tried to eat larger portions, too, to restore normal stomach size, and stopped logging calories, just trying to make sure both breakfast and lunch hit 600 cal. or more.  Next goal  variety.  Est +1 lb per week, roughly +1000 cal/D.  Finding herself sleepier, while in weight-gaining mode, and since  deciding to agonize less and accept more.  Affirmed probably need for sleep, encouraged in recovering more normal eating behavior.  Therapeutic modalities: Cognitive Behavioral Therapy, Solution-Oriented/Positive Psychology, and Ego-Supportive  Mental Status/Observations:  Appearance:   Casual     Behavior:  Appropriate  Motor:  Normal  Speech/Language:   Clear and Coherent  Affect:  Appropriate  Mood:  dysthymic, anxious  Thought process:  normal  Thought content:    worry  Sensory/Perceptual disturbances:    WNL  Orientation:  Fully oriented  Attention:  Good    Concentration:  Fair  Memory:  WNL  Insight:    Good  Judgment:   Good  Impulse Control:  Good   Risk Assessment: Danger to Self: No Self-injurious Behavior: No Danger to Others: No Physical Aggression / Violence: No Duty to Warn: No Access to Firearms a concern: No  Assessment of progress:  progressing  Diagnosis:   ICD-10-CM   1. 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 Caregiver stress  Z63.6     3. Orthorexia nervosa  F50.89     4. Recurrent major depressive disorder, in partial remission ([place])  F33.41     5. History of posttraumatic stress disorder (PTSD)  Z86.59     6. 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  Continue addressing fear of illness,  restricted eating, and weight stabilization, including seeking good fats and variety Continue reframing [name]'s behavior as second childhood, and pursue reasonable collaborations with his treatment team for cognitive health Other recommendations/advice as may be noted above Continue to utilize previously learned skills ad lib Maintain medication as prescribed and work faithfully with relevant prescriber(s) if any changes are desired or seem indicated Call the clinic on-call service, 988/hotline, 911, or present to [facility] or ER if any life-threatening psychiatric crisis Return for will call, recommend scheduling ahead. Already scheduled visit in this office 04/14/2022.  [name], PhD [name], PhD LP Clinical Psychologist, [name] [address] [place], [place] [address] [phone]

## 2022-01-06 ENCOUNTER — Encounter: Payer: Self-pay | Admitting: Urology

## 2022-01-06 ENCOUNTER — Ambulatory Visit: Payer: Medicare HMO | Admitting: Urology

## 2022-01-06 VITALS — BP 174/78 | HR 73 | Ht 63.0 in | Wt 102.0 lb

## 2022-01-06 DIAGNOSIS — R102 Pelvic and perineal pain: Secondary | ICD-10-CM

## 2022-01-06 DIAGNOSIS — N958 Other specified menopausal and perimenopausal disorders: Secondary | ICD-10-CM | POA: Diagnosis not present

## 2022-01-06 DIAGNOSIS — M6289 Other specified disorders of muscle: Secondary | ICD-10-CM | POA: Diagnosis not present

## 2022-01-06 LAB — BLADDER SCAN AMB NON-IMAGING

## 2022-01-06 NOTE — Patient Instructions (Addendum)
Eating Plan for Interstitial Cystitis ?Interstitial cystitis (IC) is a long-term (chronic) condition that causes pain and pressure in the bladder, the lower abdomen, and the pelvic area. Other symptoms of IC include urinary urgency and frequency. Symptoms tend to come and go. ?Many people with IC find that certain foods trigger their symptoms. Different foods may be problematic for different people. Some foods are more likely to cause symptoms than others. Learning which foods bother you and which do not can help you come up with an eating plan to manage IC. ?What are tips for following this plan? ?You may find it helpful to work with a dietitian. This health care provider can help you develop your eating plan by doing an elimination diet, which involves these steps: ?Start with a list of foods that you think trigger your IC symptoms along with the foods that most commonly trigger symptoms for many people with IC. ?Eliminate those foods from your diet for about one month, then start reintroducing the foods one at a time to see which ones trigger your symptoms. ?Make a list of the foods that trigger your symptoms. It may take several months to find out which foods bother you. ?Reading food labels ?Once you know which foods trigger your IC symptoms, you can avoid them. However, it is also a good idea to read food labels because some foods that trigger your symptoms may be included as ingredients in other foods. These ingredients may include: ?Chili peppers. ?Tomato products. ?Soy. ?Worcestershire sauce. ?Vinegar. ?Alcohol. ?Citrus flavors or juices. ?Artificial sweeteners. ?Monosodium glutamate. ?Shopping ?Shopping can be a challenge if many foods trigger your IC. When you go grocery shopping, bring a list of the foods you can eat. ?You can get an app for your phone that lets you know which foods are the safest and which you may want to avoid. You can find the app at the Interstitial Cystitis Network website:  www.ic-network.com ?Meal planning ?Plan your meals according to the results of your elimination diet. If you have not done an elimination diet, plan meals according to IC food lists recommended by your health care provider or dietitian. These lists tell you which foods are least and most likely to cause symptoms. ?Avoid certain types of food when you go out to eat, such as pizza and foods typically served at [place], [place], and [place]. These foods often contain ingredients that can aggravate IC. ?General information ?Here are some general guidelines for an IC eating plan: ?Do not eat large portions. ?Drink plenty of fluids with your meals. ?Do not eat foods that are high in sugar, salt, or saturated fat. ?Choose whole fruits instead of juice. ?Eat a colorful variety of vegetables. ?What foods should I eat? ?For people with IC, the best diet is a balanced one that includes things from all the food groups. Even if you have to avoid certain foods, there are still plenty of healthy choices in each group. The following are some foods that are least bothersome and may be safest to eat: ?Fruits ?Bananas. Blueberries and blueberry juice. Melons. Pears. Apples. Dates. Prunes. Raisins. Apricots. ?Vegetables ?Asparagus. Avocado. Celery. Beets. Bell peppers. Black olives. Broccoli. Brussels sprouts. Cabbage. Carrots. Cauliflower. Cucumber. Eggplant. Green beans. Potatoes. Radishes. Spinach. Squash. Turnips. Zucchini. Mushrooms. Peas. ?Grains ?Oats. Rice. Bran. Oatmeal. Whole wheat bread. ?Meats and other proteins ?Beef. Fish and other seafood. Eggs. Nuts. Peanut butter. Pork. Poultry. Lamb. Garbanzo beans. Pinto beans. ?Dairy ?Whole or low-fat milk. American, mozzarella, mild cheddar, feta, ricotta, and  cream cheeses. ?The items listed above may not be a complete list of foods and beverages you can eat. Contact a dietitian for more information. ?What foods should I avoid? ?You should avoid any foods that seem to  trigger your symptoms. It is also a good idea to avoid foods that are most likely to cause symptoms in many people with IC. These include the following: ?Fruits ?Citrus fruits, including lemons, limes, oranges, and grapefruit. Cranberries. Strawberries. Pineapple. Kiwi. ?Vegetables ?Chili peppers. Onions. Sauerkraut. Tomato and tomato products. Rosita Fire. ?Grains ?You do not need to avoid any type of grain unless it triggers your symptoms. ?Meats and other proteins ?Precooked or cured meats, such as sausages or meat loaves. Soy products. ?Dairy ?Chocolate ice cream. Processed cheese. Yogurt. ?Beverages ?Alcohol. Chocolate drinks. Coffee. Cranberry juice. Carbonated drinks. Tea (black, green, or herbal). Tomato juice. Sports drinks. ?The items listed above may not be a complete list of foods and beverages you should avoid. Contact a dietitian for more information. ?Summary ?Many people with IC find that certain foods trigger their symptoms. Different foods may be problematic for different people. Some foods are more likely to cause symptoms than others. ?You may find it helpful to work with a dietitian to do an elimination diet and come up with an eating plan that is right for you. ?Plan your meals according to the results of your elimination diet. If you have not done an elimination diet, plan your meals using IC food lists. These lists tell you which foods are least and most likely to cause symptoms. ?The best diet for people with IC is a balanced diet that includes foods from all the food groups. Even if you have to avoid certain foods, there are still plenty of healthy choices in each group. ?This information is not intended to replace advice given to you by your health care provider. Make sure you discuss any questions you have with your health care provider. ?Document Revised: 01/03/2019 Document Reviewed: 05/17/2018 ?Elsevier Patient Education ? 2022 Elsevier Inc. ? ?Pelvic Floor Dysfunction, Female ?Pelvic  floor dysfunction (PFD) is a condition that results when the group of muscles and connective tissues that support the organs in the pelvis (pelvic floor muscles) do not work well. These muscles and their connections form a sling that supports the colon and bladder. In women, they also support the uterus. ?PFD causes pelvic floor muscles to be too weak, too tight, or both. In PFD, muscle movements are not coordinated. This may cause bowel or bladder problems. It may also cause pain. ?What are the causes? ?This condition may be caused by an injury to the pelvic area or by a weakening of pelvic muscles. This often results from pregnancy and childbirth or other types of strain. In many cases, the exact cause is not known. ?What increases the risk? ?The following factors may make you more likely to develop this condition: ?Having chronic bladder tissue inflammation (interstitial cystitis). ?Being an older person. ?Being overweight. ?History of radiation treatment for cancer in the pelvic region. ?Previous pelvic surgery, such as removal of the uterus (hysterectomy). ?What are the signs or symptoms? ?Symptoms of this condition vary and may include: ?Bladder symptoms, such as: ?Trouble starting urination and emptying the bladder. ?Frequent urinary tract infections. ?Leaking urine when coughing, laughing, or exercising (stress incontinence). ?Having to pass urine urgently or frequently. ?Pain when passing urine. ?Bowel symptoms, such as: ?Constipation. ?Urgent or frequent bowel movements. ?Incomplete bowel movements. ?Painful bowel movements. ?Leaking stool or gas. ?Unexplained genital  or rectal pain. ?Genital or rectal muscle spasms. ?Low back pain. ?Other symptoms may include: ?A heavy, full, or aching feeling in the vagina. ?A bulge that protrudes into the vagina. ?Pain during or after sex. ?How is this diagnosed? ?This condition may be diagnosed based on: ?Your symptoms and medical history. ?A physical exam. During the  exam, your health care provider may check your pelvic muscles for tightness, spasm, pain, or weakness. This may include a rectal exam and a pelvic exam. ?In some cases, you may have diagnostic tests, such as: ?Electr

## 2022-01-06 NOTE — Progress Notes (Signed)
? ?01/06/22 ?6:18 PM  ? ?[place] ?01-23-1956 ?[id number] ? ?CC: Pelvic pressure, urinary symptoms, vaginal atrophy ? ?HPI: ?66 year old female here today with a number of concerns.  She has a history of fibromyalgia, depression/anxiety, and IBS.  She reports at least a few months of intermittent pelvic pressure and discomfort and what she describes as " bladder spasm."  She has been evaluated with multiple urinalyses which have all been completely benign.  She thinks certain foods can exacerbate her symptoms like spicy foods or salty foods.  She also examined herself and felt like she might have a urethral prolapse as well.  She also has pelvic pain and vaginal dryness.  She denies any gross hematuria or dysuria at this time.  Urinalysis today is completely benign, PVR is normal at [facility].  She is also had multiple normal CTs over the last year, including recently on 11/06/2021 and 11/15/2021 which showed no urologic abnormalities. ? ?PMH: ?Past Medical History:  ?Diagnosis Date  ? Abdominal pain 11/10/2021  ? 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 Anxiety   ? Arrhythmia   ? Arthritis   ? Asthma   ? Back pain   ? Bronchiectasis ([place]) 09/04/2019  ? Chronic obstructive airway disease ([place])   ? Depression   ? Diverticulosis   ? Fibromyalgia   ? Fibromyalgia   ? GERD (gastroesophageal reflux disease)   ? Heart murmur   ? Irritable bowel syndrome   ? Kidney stone   ? Lupus ([place])   ? "suspected"  ? Migraine   ? Mitral valve prolapse   ? Multiple allergies 09/04/2019  ? 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 Osteoporosis   ? Paroxysmal A-fib ([place])   ? Psoriatic arthritis ([place])   ? Raynaud's disease   ? Reactive airway disease   ? S/P chemotherapy, time since greater than 12 weeks 1984  ? cervical ca  ? Seizure ([place])   ? Sjogren's syndrome ([place])   ? Vaccine counseling 12/09/2020  ? Weight loss 11/10/2021  ? ? ?Surgical History: ?Past Surgical History:  ?Procedure Laterality Date   ? ABDOMINAL HYSTERECTOMY    ? BACK SURGERY    ? BREAST BIOPSY Left 20+ YRS AGO  ? EXCISIONAL - NEG  ? CHOLECYSTECTOMY    ? ESOPHAGOGASTRODUODENOSCOPY N/A 11/11/2021  ? Procedure: ESOPHAGOGASTRODUODENOSCOPY (EGD);  Surgeon: Annamaria Helling, DO;  Location: [facility] ENDOSCOPY;  Service: Gastroenterology;  Laterality: N/A;  ? OOPHORECTOMY    ? TUBAL LIGATION    ? ? ? ?Family History: ?Family History  ?Problem Relation Age of Onset  ? Lupus Mother   ? Breast cancer Neg Hx   ? ? ?Social History:  reports that she quit smoking about 40 years ago. Her smoking use included cigarettes. She has been exposed to tobacco smoke. She has never used smokeless tobacco. She reports that she does not currently use alcohol. She reports that she does not currently use drugs. ? ?Physical Exam: ?BP (!) 174/78   Pulse 73   Ht _0  (1.6 m)   Wt 102 lb (46.3 kg)   BMI 18.07 kg/m?   ? ?Constitutional:  Alert and oriented, No acute distress. ?Cardiovascular: No clubbing, cyanosis, or edema. ?Respiratory: Normal respiratory effort, no increased work of breathing. ?GI: Abdomen is soft, nontender, nondistended, no abdominal masses ?GU: Pelvic exam performed with ED as chaperone.  Vaginal atrophy throughout, tender, no urethral lesions and patent urethral meatus. ? ?Laboratory Data: ?Reviewed, see HPI ? ?Pertinent Imaging: ?I have  personally viewed and interpreted the CT scans from February showing no hydronephrosis or stones and normal-appearing bladder ? ?Assessment & Plan:   ?66 year old female with a number of comorbidities including fibromyalgia, anxiety/depression, and IBS who has had some intermittent lower abdominal symptoms and bladder spasm/pain.  She also has symptoms of genitourinary syndrome of menopause with vaginal dryness and discomfort. ? ?We discussed the complexities of pelvic pain and possible range of etiologies including pelvic floor dysfunction, chronic bladder pain syndrome, and interstitial cystitis.  We reviewed the  AUA guidelines that recommend an algorithmic approach to treatment for these patients, and that a trial of different medications and strategies is sometimes needed to find the approach that works best for each patient's unique situation.  I reinforced the importance of stress management, relaxation, avoiding triggers, and pain management in the approach to pelvic pain. ? ?Topical vaginal estrogen cream ?Referral placed to pelvic floor physical therapy ?Behavioral strategies discussed ?RTC with PA 6 to 8 weeks ?Consider follow-up with Dr. Matilde Sprang in the future ? ?Nickolas Madrid, MD ?01/06/2022 ? ?[facility] ?[address], [address] ?[place], [place] [address] ?[phone] ? ? ?

## 2022-01-07 LAB — URINALYSIS, COMPLETE
Bilirubin, UA: NEGATIVE
Glucose, UA: NEGATIVE
Ketones, UA: NEGATIVE
Leukocytes,UA: NEGATIVE
Nitrite, UA: NEGATIVE
Protein,UA: NEGATIVE
Specific Gravity, UA: 1.01 (ref 1.005–1.030)
Urobilinogen, Ur: 0.2 mg/dL (ref 0.2–1.0)
pH, UA: 7 (ref 5.0–7.5)

## 2022-01-07 LAB — MICROSCOPIC EXAMINATION
Bacteria, UA: NONE SEEN
Epithelial Cells (non renal): NONE SEEN /hpf (ref 0–10)

## 2022-01-11 ENCOUNTER — Telehealth: Payer: Self-pay | Admitting: Urology

## 2022-01-11 DIAGNOSIS — N952 Postmenopausal atrophic vaginitis: Secondary | ICD-10-CM

## 2022-01-11 MED ORDER — ESTRADIOL 0.1 MG/GM VA CREA: TOPICAL_CREAM | VAGINAL | 11 refills | Status: DC

## 2022-01-11 NOTE — Telephone Encounter (Signed)
Pt saw Richardo Hanks last week and said RX never got sent to pharmacy, [facility] [address]. ? ?estradiol (ESTRACE) 0.1 MG/GM vaginal cream [[id number]] ? ?Can you check in to this please? ?

## 2022-01-11 NOTE — Telephone Encounter (Signed)
RX sent

## 2022-02-14 ENCOUNTER — Ambulatory Visit: Payer: Medicare HMO | Admitting: Physician Assistant

## 2022-02-23 ENCOUNTER — Ambulatory Visit: Payer: Medicare HMO | Admitting: Physician Assistant

## 2022-02-25 ENCOUNTER — Encounter: Payer: Self-pay | Admitting: Physician Assistant

## 2022-02-28 ENCOUNTER — Telehealth (INDEPENDENT_AMBULATORY_CARE_PROVIDER_SITE_OTHER): Payer: Medicare HMO | Admitting: Psychiatry

## 2022-02-28 ENCOUNTER — Telehealth: Payer: Medicare HMO | Admitting: Psychiatry

## 2022-02-28 DIAGNOSIS — F422 Mixed obsessional thoughts and acts: Secondary | ICD-10-CM | POA: Diagnosis not present

## 2022-02-28 DIAGNOSIS — F3341 Major depressive disorder, recurrent, in partial remission: Secondary | ICD-10-CM

## 2022-02-28 DIAGNOSIS — F4310 Post-traumatic stress disorder, unspecified: Secondary | ICD-10-CM

## 2022-02-28 DIAGNOSIS — Z8659 Personal history of other mental and behavioral disorders: Secondary | ICD-10-CM

## 2022-02-28 DIAGNOSIS — Z636 Dependent relative needing care at home: Secondary | ICD-10-CM

## 2022-02-28 DIAGNOSIS — H579 Unspecified disorder of eye and adnexa: Secondary | ICD-10-CM

## 2022-02-28 DIAGNOSIS — Z630 Problems in relationship with spouse or partner: Secondary | ICD-10-CM

## 2022-02-28 NOTE — Progress Notes (Signed)
Psychotherapy Progress Note [facility] [name], PhD LP  [name])    MRN: [id number] Therapy format: Individual psychotherapy Date: 02/28/2022      Start: 10:10a     Stop: 11:10a     Time Spent: 60 min Location: Telehealth visit -- I connected with this patient by an approved telecommunication method (video), with her informed consent, and verifying identity and patient privacy.  I was located at my office and patient at her home.  As needed, we discussed the limitations, risks, and security and privacy concerns associated with telehealth service, including the availability and conditions which currently govern in-person appointments and the possibility that 3rd-party payment may not be fully guaranteed and she may be responsible for charges.  After she indicated understanding, we proceeded with the session.  Also discussed treatment planning, as needed, including ongoing verbal agreement with the plan, the opportunity to ask and answer all questions, her demonstrated understanding of instructions, and her readiness to call the office should symptoms worsen or she feels she is in a crisis state and needs more immediate and tangible assistance.   Session narrative (presenting needs, interim history, self-report of stressors and symptoms, applications of prior therapy, status changes, and interventions made in session) Suffering more today for vision problems and pain, chronic blepharitis, and now a granuloma right eyelid.  Up relatively early today.  Has neuropathy right eye, now shooting/stabbing pains.  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  All of which interferes with writing, creates a depression in her cornea, and any surgery would require further steroid and increased HSV risk.  Likely getting a referral to [facility], some hope of being able to obtain manufactured tears that would help her recovery.  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  Can do prone stretching, hug knees to chest, and used to have [place] assist before he became less competent.  Affirmed and encouraged.  Believes more she has lupus, sees butterfly rash if she showers too warmly.  Bothered by [employer] notes always describing her as "frail and in very poor health", now in bold red type.  Weight steady still above 100#, with credit to relaxing her standards about going into food on hand.  Also has been relaxing her rush to shower after coming in from outside.    Not sleeping well with [name] tossing and turning in his dreams.  Being out for his recent sleep study gave her her best sleep in ages.  Had already removed her Kindle from the bedroom.  Says she needs to get more time away from [place] and his behavior, has been going into stores more lately than she has in 3 years of pandemic + elevated respiratory risk.  It does afford her some space to clock out of visible, audible responsibility.  For his part, [name] is isolated, does not see friends, really all depends on her.  His apathy is aggravating, as is his sometimes habits of becoming pitiful, or being too intrusive, when she is distressed.  Also annoyed by him declaring she doesn't want any touch comfort.  Does attend dementia caregiver support group off and on, about q 3 wks.  Compared to others, finds it extra hard for  her partner rather than her parent to be the one dementing, which makes sense.  Warming up to consider longterm care options.    Re. contamination fears, has gotten herself to go into stores (appropriately masked), and can consider getting takeout.  On balance, makes sense to get herself out more than be pristine about contamination risk.  Irritating to  have [name] make rogue decisions, went off on him once last week.  Has been able to use alternative phrase   Has made some forays into claiming her turn to be listened to with friends, and reconnected with a female friend in [place], with whom she can have more intellectually stimulating discussions.  In gay marriage, no threat to her fidelity.    Therapeutic modalities: Cognitive Behavioral Therapy, Solution-Oriented/Positive Psychology, Ego-Supportive, Humanistic/Existential, and Faith-sensitive  Mental Status/Observations:  Appearance:   Casual     Behavior:  Appropriate  Motor:  Normal  Speech/Language:   Clear and Coherent  Affect:  Appropriate  Mood:  dysthymic  Thought process:  normal  Thought content:    rumination  Sensory/Perceptual disturbances:    WNL  Orientation:  Fully oriented  Attention:  Good    Concentration:  Good  Memory:  WNL  Insight:    Good  Judgment:   Good  Impulse Control:  Good   Risk Assessment: Danger to Self: No Self-injurious Behavior: No Danger to Others: No Physical Aggression / Violence: No Duty to Warn: No Access to Firearms a concern: No  Assessment of progress:  progressing  Diagnosis:   ICD-10-CM   1. Recurrent major depressive disorder, in partial remission (HCC)  F33.41     2. Caregiver stress  Z63.6     3. History of posttraumatic stress disorder (PTSD)  Z86.59     4. OCD - contamination/illness type  F42.2     5. PTSD (post-traumatic stress disorder)  F43.10     6. Relationship problem between partners  Z63.0     7. Eye problem - multiple issues by self-report  H57.9      Plan:  Work with medical providers on authorizations needed Stretch liberally as able for orthopedic/spinal issues Observe reasonable standards for airborne infection control given her lung condition For sleep, continue to establish electronics curfew Continue addressing fear of illness, restricted eating, and weight stabilization, including seeking  good fats and variety, normalizing eating habits and reducing grocery quarantines Seek adequate time away from home and perpetual care, and look into facility and in-home care alternatives for [name] Work assertiveness with friends to get adequate time listened to  Continue self-help through [employer] If not already, inform GI about resolution of symptoms and insight that she has food-focused OCD that has been affecting her diet and digestion  Continue reframing [name]'s behavior as second childhood, and pursue reasonable collaborations with his treatment team for cognitive health Other recommendations/advice as may be noted above Continue to utilize previously learned skills ad lib Maintain medication as prescribed and work faithfully with relevant prescriber(s) if any changes are desired or seem indicated Call the clinic on-call service, 988/hotline, 911, or present to [facility] or ER if any life-threatening psychiatric crisis Return in about 1 month (around 03/30/2022) for session(s) already scheduled. Already scheduled visit in this office 03/30/2022.  [name] Fries, PhD [name], PhD LP Clinical Psychologist, [facility] [address] [place], [place] [address] [phone]

## 2022-03-14 ENCOUNTER — Telehealth: Payer: Medicare HMO | Admitting: Physician Assistant

## 2022-03-17 ENCOUNTER — Ambulatory Visit: Payer: Medicare HMO | Attending: Urology

## 2022-03-30 ENCOUNTER — Telehealth (INDEPENDENT_AMBULATORY_CARE_PROVIDER_SITE_OTHER): Payer: Medicare HMO | Admitting: Psychiatry

## 2022-03-30 DIAGNOSIS — Z62820 Parent-biological child conflict: Secondary | ICD-10-CM

## 2022-03-30 DIAGNOSIS — R69 Illness, unspecified: Secondary | ICD-10-CM

## 2022-03-30 DIAGNOSIS — F422 Mixed obsessional thoughts and acts: Secondary | ICD-10-CM

## 2022-03-30 DIAGNOSIS — Z8659 Personal history of other mental and behavioral disorders: Secondary | ICD-10-CM

## 2022-03-30 DIAGNOSIS — F5089 Other specified eating disorder: Secondary | ICD-10-CM

## 2022-03-30 DIAGNOSIS — Z636 Dependent relative needing care at home: Secondary | ICD-10-CM

## 2022-03-30 DIAGNOSIS — G4721 Circadian rhythm sleep disorder, delayed sleep phase type: Secondary | ICD-10-CM

## 2022-03-30 DIAGNOSIS — F411 Generalized anxiety disorder: Secondary | ICD-10-CM

## 2022-03-30 DIAGNOSIS — Z630 Problems in relationship with spouse or partner: Secondary | ICD-10-CM

## 2022-03-30 DIAGNOSIS — F3341 Major depressive disorder, recurrent, in partial remission: Secondary | ICD-10-CM
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
## 2022-04-08 ENCOUNTER — Other Ambulatory Visit: Payer: Self-pay | Admitting: Physician Assistant

## 2022-04-14 ENCOUNTER — Telehealth: Payer: Medicare HMO | Admitting: Physician Assistant

## 2022-04-14 ENCOUNTER — Encounter: Payer: Self-pay | Admitting: Physician Assistant

## 2022-04-14 ENCOUNTER — Telehealth (INDEPENDENT_AMBULATORY_CARE_PROVIDER_SITE_OTHER): Payer: Medicare HMO | Admitting: Physician Assistant

## 2022-04-14 DIAGNOSIS — Z889 Allergy status to unspecified drugs, medicaments and biological substances status: Secondary | ICD-10-CM | POA: Diagnosis not present

## 2022-04-14 DIAGNOSIS — F411 Generalized anxiety disorder: Secondary | ICD-10-CM | POA: Diagnosis not present

## 2022-04-14 DIAGNOSIS — F3341 Major depressive disorder, recurrent, in partial remission: Secondary | ICD-10-CM

## 2022-04-14 DIAGNOSIS — Z636 Dependent relative needing care at home: Secondary | ICD-10-CM | POA: Diagnosis not present

## 2022-04-14 NOTE — Progress Notes (Signed)
[facility]  [name],  MRN: [id number]  PCP: Barbette Reichmann, MD  Date of Evaluation: 04/14/2022 Time spent:20 minutes  Chief Complaint:  Chief Complaint   Anxiety; Follow-up    Virtual Visit via Telehealth  I connected with patient by a video enabled telemedicine application (she was unable to connect so we had to have appt by phone) with their informed consent, and verified patient privacy and that I am speaking with the correct person using two identifiers.  I am private, in my office and the patient is at home.  I discussed the limitations, risks, security and privacy concerns of performing an evaluation and management service by phone and the availability of in person appointments. I also discussed with the patient that there may be a patient responsible charge related to this service. The patient expressed understanding and agreed to proceed.   I discussed the assessment and treatment plan with the patient. The patient was provided an opportunity to ask questions and all were answered. The patient agreed with the plan and demonstrated an understanding of the instructions.   The patient was advised to call back or seek an in-person evaluation if the symptoms worsen or if the condition fails to improve as anticipated.  I provided 20 minutes of non-face-to-face time during this encounter.  HISTORY/CURRENT STATUS: For routine 6 month med check.  Anxiety is still controlled with xanax. Life-stressors are still a big deal, husband with dementia, her health issues, and now she's got a lot of dental problems needing work. She's tried several SSRIs and she had problems and won't take anything like that again. She was not able to tolerate the amitriptyline when that was tried.No PA the way she is taking the Xanax.  She really wants to get off of it but because of all the stressors in her life right now she does not feel that she can.  If she does not  take it she has more of a sense of being overwhelmed.  Patient denies loss of interest in usual activities and is able to enjoy things, especially working on her crafts or genealogy.  Denies decreased energy.  Denies decreased motivation.  ADLs and personal hygiene are normal.  Appetite has not changed.  Weight is stable.   No extreme sadness, tearfulness, or feelings of hopelessness.  Sleeps well most of the time.  Denies any changes in concentration, making decisions or remembering things. Denies suicidal or homicidal thoughts.  Patient denies increased energy with decreased need for sleep, no increased talkativeness, no racing thoughts, no impulsivity or risky behaviors, no increased spending, no increased libido, no grandiosity, no paranoia, no AH/VH.  Denies dizziness, syncope, seizures, numbness, tingling, tremor, tics, unsteady gait, slurred speech, confusion. Denies muscle or joint pain, stiffness, or dystonia.  Individual Medical History/ Review of Systems: Changes? :No      Past medications for mental health diagnoses include: Zoloft, caused psychosis w/ hallucinations,  BuSpar, Ativan, Effexor (became psychotic) She was told to never take any kind of SSRI/SNRI again. Wellbutrin caused agitation.  Allergies: Albuterol, Cephalosporins, Flagyl [metronidazole], Macrobid [nitrofurantoin macrocrystal], Neomy-bacit-polymyx-pramoxine, Penicillins, Sulfa antibiotics, Vancomycin, Vicodin [hydrocodone-acetaminophen], Amoxicillin, Ciprofloxacin, Latex, Bacitracin, Bacitracin-neomycin-polymyxin, Barium-containing compounds, Nitrofurantoin, Tobramycin, Decongest-aid [pseudoephedrine], Eggshell membrane (chicken) [egg shells], Hydrocodone-acetaminophen, Levofloxacin, Other, Prednisone, and Zithromax [azithromycin]  Current Medications:  Current Outpatient Medications:    ALPRAZolam (XANAX) 0.5 MG tablet, TAKE 1.5 PILLS IN MORNING, 1.5 A PILLS AT LUNCH, 1 AT DINNER, AND 1.5 PILLS AT BEDTIME, ALL AS NEED,  Disp: 165 tablet, Rfl: 5   Artificial Tear Ointment (REFRESH P.M. OP), Place 1 application into both eyes at bedtime. Apply eye gel to both eyelids, Disp: , Rfl:    atenolol (TENORMIN) 25 MG tablet, Take 12.5 mg by mouth 2 (two) times daily. , Disp: , Rfl:    budesonide (PULMICORT) 180 MCG/ACT inhaler, Inhale 2 puffs into the lungs 2 (two) times daily. , Disp: , Rfl:    dicyclomine (BENTYL) 20 MG tablet, Take 1 tablet (20 mg total) by mouth 2 (two) times daily., Disp: 20 tablet, Rfl: 0   fluticasone (FLONASE) 50 MCG/ACT nasal spray, Place 1 spray into both nostrils daily as needed for allergies., Disp: , Rfl: 6   ipratropium (ATROVENT HFA) 17 MCG/ACT inhaler, Inhale 2 puffs into the lungs 4 (four) times daily., Disp: , Rfl:    levalbuterol (XOPENEX HFA) 45 MCG/ACT inhaler, Inhale 2 puffs into the lungs every 4 (four) hours as needed for wheezing., Disp: , Rfl:    Multiple Vitamin (MULTIVITAMIN WITH MINERALS) TABS, Take 1 tablet by mouth daily., Disp: , Rfl:    mupirocin ointment (BACTROBAN) 2 %, APPLY TO AFFECTED AREA 3 TIMES A DAY, Disp: , Rfl:    pantoprazole (PROTONIX) 40 MG tablet, Take 40 mg by mouth daily as needed (GERD). For stomach, Disp: , Rfl:    Polyvinyl Alcohol-Povidone (REFRESH OP), Place 1 drop into both eyes daily as needed (dry eyes)., Disp: , Rfl:    ZIRGAN 0.15 % GEL, APPLY 1 A SMALL AMOUNT IN AFFECTED EYE 5 TIMES A DAY UNTIL DIRECTED, Disp: , Rfl:    acetaminophen (TYLENOL) 500 MG tablet, Take 500 mg by mouth every 6 (six) hours as needed. (Patient not taking: Reported on 04/14/2022), Disp: , Rfl:    aspirin 81 MG chewable tablet, Chew 81 mg by mouth daily. (Patient not taking: Reported on 04/14/2022), Disp: , Rfl:    Cholecalciferol (VITAMIN D3) 10 MCG (400 UNIT) tablet, Take by mouth. (Patient not taking: Reported on 04/14/2022), Disp: , Rfl:    COVID-19 mRNA bivalent vaccine, Pfizer, (PFIZER COVID-19 VAC BIVALENT) injection, Inject into the muscle., Disp: 0.3 mL, Rfl: 0    estradiol (ESTRACE) 0.1 MG/GM vaginal cream, Estrogen Cream Instruction Discard applicator Apply pea sized amount to tip of finger to urethra before bed. Wash hands well after application. Use Monday, Wednesday and Friday, Disp: 42.5 g, Rfl: 11   gabapentin (NEURONTIN) 100 MG capsule, Take 100 mg by mouth at bedtime as needed. (Patient not taking: Reported on 04/14/2022), Disp: , Rfl:  Medication Side Effects: none  Family Medical/ Social History: Changes? No  MENTAL HEALTH EXAM:  There were no vitals taken for this visit.There is no height or weight on file to calculate BMI.  General Appearance:  Unable to assess  Eye Contact:   Unable to assess  Speech:  Clear and Coherent, Normal Rate, and Talkative  Volume:  Normal  Mood:  Euthymic  Affect:   Unable to assess  Thought Process:  Goal Directed and Descriptions of Associations: Intact  Orientation:  Full (Time, Place, and Person)  Thought Content: Logical   Suicidal Thoughts:  No  Homicidal Thoughts:  No  Memory:  WNL  Judgement:  Good   Insight:  Good  Psychomotor Activity:   Unable to assess  Concentration:  Concentration: Good and Attention Span: Good  Recall:  Good  Fund of Knowledge: Good  Language: Good  Assets:  Desire for Improvement  ADL's:  Intact  Cognition: WNL  Prognosis:  Good    DIAGNOSES:    ICD-10-CM   1. 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 Caregiver stress  Z63.6     3. Recurrent major depressive disorder, in partial remission (HCC)  F33.41     4. Multiple allergies  Z88.9      Receiving Psychotherapy: Yes w/ Dr. [name]   RECOMMENDATIONS:  PDMP reviewed.  Last Xanax filled 04/11/2022.   I provided 20 minutes of non-face-to-face time during this encounter, including time spent before and after the visit in records review, medical decision making, counseling pertinent to today's visit, and charting.   Because of her numerous allergies and intolerances to  medications to help prevent anxiety we will continue the Xanax.  We will try to decrease the dose when appropriate but because of the stress she is under it has not the right time.  She agrees.  Continue Xanax 0.5 mg, 1.5 pills every morning, 1.5 pill at lunch, 1 pill at dinner, 1.5 at bedtime as needed.   Continue all supplements. Continue psychotherapy with Dr. [name].  Return in 6 months.  Melony Overly, PA-C

## 2022-04-18 ENCOUNTER — Ambulatory Visit (INDEPENDENT_AMBULATORY_CARE_PROVIDER_SITE_OTHER): Payer: Medicare HMO

## 2022-04-18 DIAGNOSIS — T148XXA Other injury of unspecified body region, initial encounter: Secondary | ICD-10-CM

## 2022-04-26 ENCOUNTER — Ambulatory Visit (INDEPENDENT_AMBULATORY_CARE_PROVIDER_SITE_OTHER): Payer: Medicare HMO | Admitting: Vascular Surgery

## 2022-04-26 ENCOUNTER — Encounter (INDEPENDENT_AMBULATORY_CARE_PROVIDER_SITE_OTHER): Payer: Self-pay | Admitting: Vascular Surgery

## 2022-04-26 VITALS — BP 135/63 | HR 62 | Resp 17 | Ht 63.0 in | Wt 101.0 lb

## 2022-04-26 DIAGNOSIS — L409 Psoriasis, unspecified: Secondary | ICD-10-CM

## 2022-04-26 DIAGNOSIS — I8310 Varicose veins of unspecified lower extremity with inflammation: Secondary | ICD-10-CM | POA: Diagnosis not present

## 2022-04-26 DIAGNOSIS — L4050 Arthropathic psoriasis, unspecified: Secondary | ICD-10-CM

## 2022-04-26 DIAGNOSIS — I700 Atherosclerosis of aorta: Secondary | ICD-10-CM | POA: Diagnosis not present

## 2022-04-26 NOTE — Progress Notes (Signed)
MRN : [id number]  Sandra Burns is a 66 y.o. (August 20, 1956) female who presents with chief complaint of No chief complaint on file. Marland Kitchen  History of Present Illness: Patient returns today in follow up after her venous reflux study.  She had marked bruising in the lower leg as well as swelling in the left leg.  She had an episode of significant left lower extremity swelling but it did clear with elevation and short period of time.  She has bruises bilaterally.  She had prominent superficial varicosities present for some time.  A venous reflux study that was performed recently showed no evidence of deep venous thrombosis, superficial thrombophlebitis, or venous reflux in either lower extremity. She has previously been diagnosed with aortic atherosclerosis on a CT scan earlier this year that I have reviewed.  This would be mild to moderate in nature without significant stenosis at this point.  She is very concerned about this due to a very strong family history of atherosclerotic disease and early vascular and cardiac death.  She does not currently have any claudication or ischemic rest pain symptoms.  Current Outpatient Medications  Medication Sig Dispense Refill   acetaminophen (TYLENOL) 500 MG tablet Take 500 mg by mouth every 6 (six) hours as needed.     ALPRAZolam (XANAX) 0.5 MG tablet TAKE 1.5 PILLS IN MORNING, 1.5 A PILLS AT LUNCH, 1 AT DINNER, AND 1.5 PILLS AT BEDTIME, ALL AS NEED 165 tablet 5   Artificial Tear Ointment (REFRESH P.M. OP) Place 1 application into both eyes at bedtime. Apply eye gel to both eyelids     atenolol (TENORMIN) 25 MG tablet Take 12.5 mg by mouth 2 (two) times daily.      budesonide (PULMICORT) 180 MCG/ACT inhaler Inhale 2 puffs into the lungs 2 (two) times daily.      dicyclomine (BENTYL) 20 MG tablet Take 1 tablet (20 mg total) by mouth 2 (two) times daily. 20 tablet 0   fluticasone (FLONASE) 50 MCG/ACT nasal spray Place 1 spray into both nostrils daily as  needed for allergies.  6   ipratropium (ATROVENT HFA) 17 MCG/ACT inhaler Inhale 2 puffs into the lungs 4 (four) times daily.     levalbuterol (XOPENEX HFA) 45 MCG/ACT inhaler Inhale 2 puffs into the lungs every 4 (four) hours as needed for wheezing.     Multiple Vitamin (MULTIVITAMIN WITH MINERALS) TABS Take 1 tablet by mouth daily.     mupirocin ointment (BACTROBAN) 2 % APPLY TO AFFECTED AREA 3 TIMES A DAY     pantoprazole (PROTONIX) 40 MG tablet Take 40 mg by mouth daily as needed (GERD). For stomach     Polyvinyl Alcohol-Povidone (REFRESH OP) Place 1 drop into both eyes daily as needed (dry eyes).     ZIRGAN 0.15 % GEL APPLY 1 A SMALL AMOUNT IN AFFECTED EYE 5 TIMES A DAY UNTIL DIRECTED     aspirin 81 MG chewable tablet Chew 81 mg by mouth daily. (Patient not taking: Reported on 04/14/2022)     Cholecalciferol (VITAMIN D3) 10 MCG (400 UNIT) tablet Take by mouth. (Patient not taking: Reported on 04/14/2022)     COVID-19 mRNA bivalent vaccine, Pfizer, (PFIZER COVID-19 VAC BIVALENT) injection Inject into the muscle. (Patient not taking: Reported on 04/26/2022) 0.3 mL 0   gabapentin (NEURONTIN) 100 MG capsule Take 100 mg by mouth at bedtime as needed. (Patient not taking: Reported on 04/14/2022)     No current facility-administered medications for this visit.  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   Osteoporosis    Paroxysmal A-fib (HCC)     Psoriatic arthritis (HCC)    Raynaud's disease    Reactive airway disease    S/P chemotherapy, time since greater than 12 weeks 1984   cervical ca   Seizure ([place])    Sjogren's syndrome ([place])    Vaccine counseling 12/09/2020   Weight loss 11/10/2021    Past Surgical History:  Procedure Laterality Date   ABDOMINAL HYSTERECTOMY     BACK SURGERY     BREAST BIOPSY Left 20+ YRS AGO   EXCISIONAL - NEG   CHOLECYSTECTOMY     ESOPHAGOGASTRODUODENOSCOPY N/A 11/11/2021   Procedure: ESOPHAGOGASTRODUODENOSCOPY (EGD);  Surgeon: Annamaria Helling, DO;  Location: [facility] ENDOSCOPY;  Service: Gastroenterology;  Laterality: N/A;   OOPHORECTOMY     TUBAL LIGATION       Social History   Tobacco Use   Smoking status: Former    Types: Cigarettes    Quit date: 07/13/1981    Years since quitting: 40.8    Passive exposure: Past   Smokeless tobacco: Never  Vaping Use   Vaping Use: Never used  Substance Use Topics   Alcohol use: Not Currently    Comment: drank as a teen.  Not since 1990   Drug use: Not Currently    Comment: pot as a teen       Family History  Problem Relation Age of Onset   Lupus Mother    Breast cancer Neg Hx   Multiple family members with early stroke, heart attack, and cardiovascular death  Allergies  Allergen Reactions   Albuterol Other (See Comments) and Palpitations    Atrial fibrillation Atrial fibrillation    Cephalosporins Itching and Rash    Rash and itching   Flagyl [Metronidazole] Dermatitis and Other (See Comments)    Other Reaction: SKIN SLOUGHING   Macrobid [Nitrofurantoin Macrocrystal] Itching   Neomy-Bacit-Polymyx-Pramoxine Itching and Swelling    Pt got the issue for the eye lids after instill the eyes drops.    Penicillins Anaphylaxis   Sulfa Antibiotics Itching   Vancomycin Shortness Of Breath    Other reaction(s): Unknown   Vicodin [Hydrocodone-Acetaminophen] Shortness Of Breath   Amoxicillin Itching and Swelling    She had intense  itching and a rash see updated answers to questions below  Has patient had a PCN reaction causing immediate rash, facial/tongue/throat swelling, SOB or lightheadedness with hypotension: No Has patient had a PCN reaction causing severe rash involving mucus membranes or skin necrosis: No Has patient had a PCN reaction that required hospitalization: No Has patient had a PCN reaction occurring within the last 10 years: No If all of the above answers are "NO", then may proceed with Ceph   Ciprofloxacin Swelling    Arm swelled up bright  red , rash, itching   Latex Rash   Bacitracin    Bacitracin-Neomycin-Polymyxin Itching and Swelling    Pt got the issue for the eye lids after instill the eyes drops.    Barium-Containing Compounds    Nitrofurantoin     Other reaction(s): Unknown   Tobramycin Itching and Swelling    Pt got the issue after instill the eyes drops   Decongest-Aid [Pseudoephedrine] Palpitations   Eggshell Membrane (Chicken) [Egg Shells] Rash   Hydrocodone-Acetaminophen Itching   Levofloxacin Rash    Itchy, swelling   Other Palpitations    BARIUM CONTRAST   Prednisone Palpitations   Zithromax [Azithromycin] Other (See Comments)    GI upset, C-diff     REVIEW OF SYSTEMS (Negative unless checked)  Constitutional: _0 Weight loss  _1 Fever  _2 Chills Cardiac: _3 Chest pain   _4 Chest pressure   _5 Palpitations   _6 Shortness of breath when laying flat   _7 Shortness of breath at rest   _8 Shortness of breath with exertion. Vascular:  _9 Pain in legs with walking   _10 Pain in legs at rest   _11 Pain in legs when laying flat   _12 Claudication   _13 Pain in feet when walking  _14 Pain in feet at rest  _15 Pain in feet when laying flat   _16 History of DVT   _17 Phlebitis   _18 Swelling in legs   _19 Varicose veins   _20 Non-healing ulcers Pulmonary:   _21 Uses home oxygen   _22 Productive cough   _23 Hemoptysis   _24 Wheeze  _25 COPD   _26 Asthma Neurologic:  _27 Dizziness  _28 Blackouts   _29 Seizures   _30 History of stroke    _31 History of TIA  _32 Aphasia   _33 Temporary blindness   _34 Dysphagia   _35 Weakness or numbness in arms   _36 Weakness or numbness in legs Musculoskeletal:  _37 Arthritis   _38 Joint swelling   _39 Joint pain   _40 Low back pain Hematologic:  _41 Easy bruising  _42 Easy bleeding   _43 Hypercoagulable state   _44 Anemic   Gastrointestinal:  _45 Blood in stool   _46 Vomiting blood  _47 Gastroesophageal reflux/heartburn   _48 Abdominal pain Genitourinary:  _49 Chronic kidney disease   _50 Difficult urination  _51 Frequent urination  _52 Burning with urination   _53 Hematuria Skin:  _54 Rashes   _55 Ulcers   _56 Wounds Psychological:  _57 History of anxiety   _58  History of major depression.  Physical Examination  BP 135/63 (BP Location: Right Arm)   Pulse 62   Resp 17   Ht _59  (1.6 m)   Wt 101 lb (45.8 kg)   BMI 17.89 kg/m  Gen:   NAD, thin and frail appearing Head: [place]/AT, + temporalis wasting. Ear/Nose/Throat: Hearing grossly intact, nares w/o erythema or drainage Eyes: Conjunctiva clear. Sclera non-icteric Neck: Supple.  Trachea midline Pulmonary:  Good air movement, no use of accessory muscles.  Cardiac: RRR, no JVD Vascular:  Vessel Right Left  Radial Palpable Palpable                          PT Palpable Palpable  DP Palpable Palpable   Gastrointestinal: soft, non-tender/non-distended. No guarding/reflex.  Musculoskeletal: M/S 5/5 throughout.  No deformity or atrophy.  Scattered varicosities bilaterally.  No significant lower extremity edema. Neurologic: Sensation grossly intact in extremities.  Symmetrical.  Speech is fluent.  Psychiatric: Judgment intact, Mood & affect appropriate for pt's clinical situation. Dermatologic: No rashes or ulcers noted.  No cellulitis or open wounds.      Labs No results found for this or any previous visit (from the past 2160 hour(s)).  Radiology VAS [place]  LOWER EXTREMITY VENOUS REFLUX  Result Date: 04/21/2022  Lower Venous Reflux Study Patient Name:  Sandra Burns [facility]   Date of Exam:   04/18/2022 Medical Rec #: [id number]              Accession #:    [id number] Date of Birth: 09-May-1956              Patient Gender: F Patient Age:   10 years Exam Location:  [facility] Procedure:      VAS [place] LOWER EXTREMITY VENOUS REFLUX Referring Phys: Eulogio Ditch --------------------------------------------------------------------------------  Indications: Calf ankle discoloration intermittant.  Performing Technologist: Concha [place] RVT  Examination Guidelines: A complete evaluation includes B-mode imaging, spectral Doppler, color Doppler, and power Doppler as needed of all accessible portions of each vessel. Bilateral testing is considered an integral part of a complete examination. Limited examinations for reoccurring indications may be performed as noted. The reflux portion of the exam is performed with the patient in reverse Trendelenburg. Significant venous reflux is defined as >500 ms in the superficial venous system, and >1 second in the deep venous system.   Summary: Bilateral: - No evidence of deep vein thrombosis seen in the lower extremities, bilaterally, from the common femoral through the popliteal veins. - No evidence of superficial venous thrombosis in the lower extremities, bilaterally. - No evidence of deep venous insufficiency seen bilaterally in the lower extremity. - No evidence of superficial venous reflux seen in the greater saphenous veins bilaterally. - No evidence of superficial venous reflux seen in the short saphenous veins bilaterally.  *See table(s) above for measurements and observations. Electronically signed by Leotis Pain MD on 04/21/2022 at 7:15:30 AM.    Final     Assessment/Plan  Varicose veins with inflammation A venous reflux study that was performed recently showed no evidence of deep venous thrombosis, superficial thrombophlebitis, or venous reflux in either lower extremity.  No role for intervention.  She does have some fairly prominent  superficial varicosities without significant venous reflux, I would not recommend any intervention for this.  Her easy bruising is likely combination of her small size and lack of subcutaneous fat and soft tissue and fragile skin.  Aortic atherosclerosis ([place]) Patient has been previously seen to have aortic atherosclerosis on CT scan.  She is extremely concerned about this given her strong family history of atherosclerotic disease and death.  This can be monitored every 2 years or so with duplex and less significant symptoms develop in the interim.    Leotis Pain, MD  04/26/2022 2:31 PM    This note was created with Dragon medical transcription system.  Any errors from dictation are purely unintentional

## 2022-04-26 NOTE — Assessment & Plan Note (Signed)
A venous reflux study that was performed recently showed no evidence of deep venous thrombosis, superficial thrombophlebitis, or venous reflux in either lower extremity.  No role for intervention.  She does have some fairly prominent superficial varicosities without significant venous reflux, I would not recommend any intervention for this.  Her easy bruising is likely combination of her small size and lack of subcutaneous fat and soft tissue and fragile skin.

## 2022-04-26 NOTE — Assessment & Plan Note (Signed)
Patient has been previously seen to have aortic atherosclerosis on CT scan.  She is extremely concerned about this given her strong family history of atherosclerotic disease and death.  This can be monitored every 2 years or so with duplex and less significant symptoms develop in the interim.

## 2022-04-28 ENCOUNTER — Ambulatory Visit: Payer: Medicare HMO | Admitting: Psychiatry

## 2022-05-02 ENCOUNTER — Ambulatory Visit (INDEPENDENT_AMBULATORY_CARE_PROVIDER_SITE_OTHER): Payer: Medicare HMO | Admitting: Psychiatry

## 2022-05-02 DIAGNOSIS — Z636 Dependent relative needing care at home: Secondary | ICD-10-CM | POA: Diagnosis not present

## 2022-05-02 DIAGNOSIS — E638 Other specified nutritional deficiencies: Secondary | ICD-10-CM

## 2022-05-02 DIAGNOSIS — F411 Generalized anxiety disorder: Secondary | ICD-10-CM

## 2022-05-02 DIAGNOSIS — F5089 Other specified eating disorder: Secondary | ICD-10-CM

## 2022-05-02 DIAGNOSIS — F3341 Major depressive disorder, recurrent, in partial remission: Secondary | ICD-10-CM | POA: Diagnosis not present

## 2022-05-02 DIAGNOSIS — Z8659 Personal history of other mental and behavioral disorders: Secondary | ICD-10-CM

## 2022-05-02 DIAGNOSIS — B4481 Allergic bronchopulmonary aspergillosis: Secondary | ICD-10-CM

## 2022-05-02 DIAGNOSIS — R69 Illness, unspecified: Secondary | ICD-10-CM

## 2022-05-02 DIAGNOSIS — F422 Mixed obsessional thoughts and acts: Secondary | ICD-10-CM | POA: Diagnosis not present

## 2022-05-02 DIAGNOSIS — G4721 Circadian rhythm sleep disorder, delayed sleep phase type: Secondary | ICD-10-CM

## 2022-05-02 NOTE — Progress Notes (Signed)
Psychotherapy Progress Note [facility] Sandra [name], PhD LP  [name])    MRN: [id number] Therapy format: Individual psychotherapy Date: 05/02/2022      Start: 11:11a     Stop: 12:26n     Time Spent: 75 min Location: Telehealth visit -- I connected with this patient by an approved telecommunication method (video), with her informed consent, and verifying identity and patient privacy.  I was located at my office and patient at her home.  As needed, we discussed the limitations, risks, and security and privacy concerns associated with telehealth service, including the availability and conditions which currently govern in-person appointments and the possibility that 3rd-party payment may not be fully guaranteed and she may be responsible for charges.  After she indicated understanding, we proceeded with the session.  Also discussed treatment planning, as needed, including ongoing verbal agreement with the plan, the opportunity to ask and answer all questions, her demonstrated understanding of instructions, and her readiness to call the office should symptoms worsen or she feels she is in a crisis state and needs more immediate and tangible assistance.   Session narrative (presenting needs, interim history, self-report of stressors and symptoms, applications of prior therapy, status changes, and interventions made in session) Continues to be careful about COVID exposure, noting uptick in rates and XXB variant afoot.  Going out more than she was, still, including getting a range of health care appts caught up, including dentist.  Has gotten used to health care masking.  Some cascade of complications with dental work, including multiple crowns, a root canal, a flipper, and some gum inflammation, all meaning some long hours in the chair.  Something like 5 visits past 6 wks or so.  Cardio, vascular surgeon, Doppler redone on atherosclerotic iliac arteries  (genetic, and autoimmune suspected).  All told, several thousand dollars' worth of health care bills, and she hates owing money.  Taking it all on as exposure therapy, validated and encouraged.  Actually hugged her hygienist, going against both her infection fears and abuse-related touch aversion.  Stress of caring for Sandra Burns continues, daily uncertainty what behavior she's going to get.  BP responds to circumstances, and cardio is being appropriately cautious in interpreting.  Found she can lower it 30 points with 15 minutes of guided imagery relaxation.  Affirmed and encouraged.  Maintaining weight (102), calories, struggling to get up those last 8# to optimal.  Feels her "world of food" (providers of prepared food) is still small.  Feels she is in an autoimmune flareup with pain and skin reactions.  Sandra Burns can be helpful with his sometime obsession with health ratings on restaurants.  Knows her nutrient testing was good last year, but undoubtedly still needs better supply of vegetables, not just rely on [employer].  Dreaming of food now, particularly fruits and vegetables, which probably reflects her physiological needs.  Discussed reframing her food "rules" mind as a tyrant -- maybe even similar to oppressive white men she's known of -- to be disobeyed.  Realizes she's had anorexic episodes before as countercontrol for domineering men.  Also reframed saying "yes" as just as much in control as saying "no" to food.  Explored hypotheses about secondary gain (get sick, hospital, get taken care of; maybe hasten death and get out from under caregiving stress).    Took the Kindle out of the bedroom, and falling asleep much faster, 5-30 minutes.  Has some elaborate pre-bed routine for oral health, and staying up  one night to see a documentary with Sandra Burns seemed to go past a window of readiness.  Has a well-worn guided imagery technique -- imagine building a house.  Noticed the kind of TV she may watch could be overly  stimulating.    Noticing celebrity deaths who are more her contemporaries now, heightened awareness of mortality and regrets in life, maybe more depressed.    Sandra Burns's hearing is worse, and daytime sleepiness continues despite effective CPAP.  Fighting the idea of assessing his hearing, doesn't want to spend the money (or acknowledge the limitation). Thinking of persuading him by reminding him he has 401k RMD coming up.  Or maybe by reminding him he could choose to spend on hearing aid or on the emergency treatment he'll need if he frustrates her enough.    Therapeutic modalities: Cognitive Behavioral Therapy, Solution-Oriented/Positive Psychology, and Ego-Supportive  Mental Status/Observations:  Appearance:   Casual     Behavior:  Appropriate  Motor:  Normal  Speech/Language:   Clear and Coherent  Affect:  Appropriate  Mood:  fatigued  Thought process:  normal  Thought content:    WNL  Sensory/Perceptual disturbances:    WNL  Orientation:  Fully oriented  Attention:  Good    Concentration:  Fair  Memory:  WNL  Insight:    Good  Judgment:   Good  Impulse Control:  Good   Risk Assessment: Danger to Self: No Self-injurious Behavior: No Danger to Others: No Physical Aggression / Violence: No Duty to Warn: No Access to Firearms a concern: No  Assessment of progress:  progressing  Diagnosis:   ICD-10-CM   1. 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 Caregiver stress  Z63.6     3. Recurrent major depressive disorder, in partial remission (HCC)  F33.41     4. OCD - contamination/illness type  F42.2     5. History of posttraumatic stress disorder (PTSD)  Z86.59     6. ABPA (allergic bronchopulmonary aspergillosis) (HCC)  B44.81     7. Orthorexia nervosa  F50.89     8. r/o Lupus ([facility]) vs. unspecified complex AI disorder  R69     9. Imbalanced nutrition  E63.8     10. 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       Plan:  Physical health Stretch liberally as able for orthopedic/spinal issues Observe reasonable standards for airborne infection control given her lung condition For sleep, continue to establish electronics curfew For weight maintenance, ensure good calorie density in food choices For health, ensure good variety, especially adequate vegetable and fruit nutrients Fear of illness Continue exposures to perceived risk of contamination she knows is miniscule or well-controlled Continue self-help through [employer] Take food-related dreams as a sign of restoring normal appetite Reframe the moment she feels like avoiding food as her golden opportunity to say "yes" and be in control every bit a much as "no" Caregiver stress Seek adequate time away from home and perpetual care Continue reframing Sandra Burns's behavior sometimes as second childhood, not intentional problems Pursue reasonable collaborations with his treatment team for cognitive health Look into respite care and facility and in-home care alternatives for Sandra Burns to get ahead on eventual developments Social support Work assertiveness with friends to get adequate time listened to  If interested, connect with dementia support organization(s) Family strife Be willing to tell B [name] he gets in his own way with pestering, not just time him  out Continue stoic approach to [name] resentment/rejection.  OK to keep touch with ex-H. Other recommendations/advice as may be noted above Continue to utilize previously learned skills ad lib Maintain medication as prescribed and work faithfully with relevant prescriber(s) if any changes are desired or seem indicated Call the clinic on-call service, 988/hotline, 911, or present to [facility] or ER if any life-threatening psychiatric crisis Return in about 1 month (around 06/02/2022) for session(s) already scheduled, recommend scheduling ahead. Already scheduled visit in this office 06/14/2022.  [name], PhD [name], PhD LP Clinical Psychologist, [facility] [address], [place], [place] [address] [phone]

## 2022-05-04 ENCOUNTER — Encounter: Payer: Self-pay | Admitting: Gastroenterology

## 2022-05-05 ENCOUNTER — Encounter: Admission: RE | Payer: Self-pay | Source: Home / Self Care

## 2022-05-05 ENCOUNTER — Ambulatory Visit: Admission: RE | Admit: 2022-05-05 | Payer: Medicare HMO | Source: Home / Self Care | Admitting: Gastroenterology

## 2022-05-05 SURGERY — COLONOSCOPY WITH PROPOFOL
Anesthesia: General

## 2022-05-11 ENCOUNTER — Ambulatory Visit (INDEPENDENT_AMBULATORY_CARE_PROVIDER_SITE_OTHER): Payer: Medicare HMO | Admitting: Psychiatry

## 2022-05-11 DIAGNOSIS — R69 Illness, unspecified: Secondary | ICD-10-CM

## 2022-05-11 DIAGNOSIS — F411 Generalized anxiety disorder: Secondary | ICD-10-CM | POA: Diagnosis not present

## 2022-05-11 DIAGNOSIS — F5089 Other specified eating disorder: Secondary | ICD-10-CM

## 2022-05-11 DIAGNOSIS — E638 Other specified nutritional deficiencies: Secondary | ICD-10-CM

## 2022-05-11 DIAGNOSIS — F3341 Major depressive disorder, recurrent, in partial remission: Secondary | ICD-10-CM

## 2022-05-11 DIAGNOSIS — Z8659 Personal history of other mental and behavioral disorders: Secondary | ICD-10-CM

## 2022-05-11 DIAGNOSIS — Z636 Dependent relative needing care at home: Secondary | ICD-10-CM | POA: Diagnosis not present

## 2022-05-11 DIAGNOSIS — F422 Mixed obsessional thoughts and acts: Secondary | ICD-10-CM

## 2022-05-11 NOTE — Progress Notes (Signed)
Psychotherapy Progress Note [facility] [name], PhD LP  [name])    MRN: [id number] Therapy format: Individual psychotherapy Date: 05/11/2022      Start: 3:30p     Stop: 4:15p     Time Spent: 45 min Location: Telehealth visit -- I connected with this patient by an approved telecommunication method (audio only), with her informed consent, and verifying identity and patient privacy.  I was located at my office and patient at her home.  As needed, we discussed the limitations, risks, and security and privacy concerns associated with telehealth service, including the availability and conditions which currently govern in-person appointments and the possibility that 3rd-party payment may not be fully guaranteed and she may be responsible for charges.  After she indicated understanding, we proceeded with the session.  Also discussed treatment planning, as needed, including ongoing verbal agreement with the plan, the opportunity to ask and answer all questions, her demonstrated understanding of instructions, and her readiness to call the office should symptoms worsen or she feels she is in a crisis state and needs more immediate and tangible assistance.   Session narrative (presenting needs, interim history, self-report of stressors and symptoms, applications of prior therapy, status changes, and interventions made in session) Attempted to connect about 3:15, f/u call 3:30, says she's having wifi problems, agreed to carry out by phone.  Got very angry an hour ago in relation to a perceived abuse of power by her [name] chair Jolene (clamping down on newsletter articles and making a rule by fiat, then ).  Thinks her BP flew very high, and because of it declined a request to call her on the road to talk over the issue.  Sounds like it could be a case of reading in too much intention, when the woman is actually not so fluent in the social graces Shelitha knows.   Has found her to be dismissive, or at least tin-eared, about implications.  Coached to suspend judgment and when she addresses Jolene to go with assuming good intentions and ask her to hear out how her policy interferes with Laurence doing the duty she knows.    Follows coming down with a viral infection at a time when Antonya has been recovering from a viral infection, she suspects she picked up at the dentist, that was very fatiguing with very sore throat and irritated chest.  2 negative COVID tests Sat and Sun, gradually felt better, yesterday closer to full strength.    Reports she did try a response with [name] advised before, namely validating it when [name] admits a mistake, in this case maybe just overperceiving an issue with a mutual acquaintance.    [name] had good news -- new neurologist, seems to have it right about him, and now there is a test that can find Lewy Body proteins (tau) in skin samples.  Hopeful of getting that done.  Therapeutic modalities: Cognitive Behavioral Therapy and Solution-Oriented/Positive Psychology  Mental Status/Observations:  Appearance:   Casual     Behavior:  Appropriate  Motor:  Normal  Speech/Language:   Clear and Coherent  Affect:  Appropriate  Mood:  dysthymic  Thought process:  normal  Thought content:    WNL  Sensory/Perceptual disturbances:    WNL  Orientation:  Fully oriented  Attention:  Good    Concentration:  Good  Memory:  WNL  Insight:    Good  Judgment:   Good  Impulse Control:  Good  Risk Assessment: Danger to Self: No Self-injurious Behavior: No Danger to Others: No Physical Aggression / Violence: No Duty to Warn: No Access to Firearms a concern: No  Assessment of progress:  stabilized  Diagnosis:   ICD-10-CM   1. 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 Caregiver stress  Z63.6     3. Recurrent major depressive disorder, in partial remission (HCC)  F33.41     4. OCD - contamination/illness  type  F42.2     5. History of posttraumatic stress disorder (PTSD)  Z86.59     6. Orthorexia nervosa  F50.89     7. r/o Lupus (SLE) vs. unspecified complex AI disorder  R69     8. Imbalanced nutrition  E63.8      Plan:  Physical health Stretch liberally as able for orthopedic/spinal issues Observe reasonable standards for airborne infection control given her lung condition For sleep, continue to establish electronics curfew For weight maintenance, ensure good calorie density in food choices For health, ensure good variety, especially adequate vegetable and fruit nutrients Fear of illness Continue exposures to perceived risk of contamination she knows is miniscule or well-controlled Continue self-help through [employer] Take food-related dreams as a sign of restoring normal appetite Reframe the moment she feels like avoiding food as her golden opportunity to say "yes" and be in control every bit a much as "no" Caregiver stress Seek adequate time away from home and perpetual care Continue reframing [name]'s behavior sometimes as second childhood, not intentional problems Pursue reasonable collaborations with his treatment team for cognitive health Look into respite care and facility and in-home care alternatives for [name] to get ahead on eventual developments Social support Work assertiveness with friends to get adequate time listened to  Be as prompt as possible to give grace,  If interested, connect with dementia support organization(s) Family strife Be willing to tell B [name] he gets in his own way with pestering, not just time him out Continue stoic approach to [name] resentment/rejection.  OK to keep touch with ex-H. Other recommendations/advice as may be noted above Continue to utilize previously learned skills ad lib Maintain medication as prescribed and work faithfully with relevant prescriber(s) if any changes are desired or seem indicated Call the clinic on-call  service, 988/hotline, 911, or present to [facility] or ER if any life-threatening psychiatric crisis Return for session(s) already scheduled, available earlier @ PT's need. Already scheduled visit in this office 06/14/2022.  [name] Fries, PhD [name], PhD LP Clinical Psychologist, [facility] [address] [place], [place] [address] [phone]

## 2022-05-14 ENCOUNTER — Encounter (HOSPITAL_BASED_OUTPATIENT_CLINIC_OR_DEPARTMENT_OTHER): Payer: Self-pay

## 2022-05-14 ENCOUNTER — Other Ambulatory Visit: Payer: Self-pay

## 2022-05-14 ENCOUNTER — Emergency Department (HOSPITAL_BASED_OUTPATIENT_CLINIC_OR_DEPARTMENT_OTHER)
Admission: EM | Admit: 2022-05-14 | Discharge: 2022-05-14 | Disposition: A | Discharge status: Home / Self Care | Payer: Medicare HMO | Attending: Emergency Medicine | Admitting: Emergency Medicine

## 2022-05-14 ENCOUNTER — Emergency Department (HOSPITAL_BASED_OUTPATIENT_CLINIC_OR_DEPARTMENT_OTHER): Payer: Medicare HMO | Admitting: Radiology

## 2022-05-14 DIAGNOSIS — H9209 Otalgia, unspecified ear: Secondary | ICD-10-CM | POA: Insufficient documentation

## 2022-05-14 DIAGNOSIS — R001 Bradycardia, unspecified: Secondary | ICD-10-CM | POA: Insufficient documentation

## 2022-05-14 DIAGNOSIS — J069 Acute upper respiratory infection, unspecified: Secondary | ICD-10-CM

## 2022-05-14 DIAGNOSIS — Z8616 Personal history of COVID-19: Secondary | ICD-10-CM | POA: Insufficient documentation

## 2022-05-14 DIAGNOSIS — Z7982 Long term (current) use of aspirin: Secondary | ICD-10-CM | POA: Diagnosis not present

## 2022-05-14 DIAGNOSIS — R0602 Shortness of breath: Secondary | ICD-10-CM | POA: Diagnosis present

## 2022-05-14 DIAGNOSIS — Z20822 Contact with and (suspected) exposure to covid-19: Secondary | ICD-10-CM | POA: Diagnosis not present

## 2022-05-14 LAB — BASIC METABOLIC PANEL
Anion gap: 11 (ref 5–15)
BUN: 8 mg/dL (ref 8–23)
CO2: 26 mmol/L (ref 22–32)
Calcium: 9.3 mg/dL (ref 8.9–10.3)
Chloride: 103 mmol/L (ref 98–111)
Creatinine, Ser: 0.56 mg/dL (ref 0.44–1.00)
GFR, Estimated: >60 mL/min (ref >60)
Glucose, Bld: 77 mg/dL (ref 70–99)
Potassium: 3.6 mmol/L (ref 3.5–5.1)
Sodium: 140 mmol/L (ref 135–145)

## 2022-05-14 LAB — CBC
HCT: 43 % (ref 36.0–46.0)
Hemoglobin: 14.1 g/dL (ref 12.0–15.0)
MCH: 30.6 pg (ref 26.0–34.0)
MCHC: 32.8 g/dL (ref 30.0–36.0)
MCV: 93.3 fL (ref 80.0–100.0)
Platelets: 253 10*3/uL (ref 150–400)
RBC: 4.61 MIL/uL (ref 3.87–5.11)
RDW: 12.6 % (ref 11.5–15.5)
WBC: 8.7 10*3/uL (ref 4.0–10.5)
nRBC: 0 % (ref 0.0–0.2)

## 2022-05-14 LAB — TROPONIN I (HIGH SENSITIVITY): Troponin I (High Sensitivity): 4 ng/L (ref <18)

## 2022-05-14 LAB — SARS CORONAVIRUS 2 BY RT PCR: SARS Coronavirus 2 by RT PCR: NEGATIVE

## 2022-05-14 NOTE — ED Triage Notes (Addendum)
Pt states that she has had shortness of breath, sore throat, muscle aches, and fatigue x9 days. Pt states that she took 2 tests the first two days she felt ill but both were negative.

## 2022-05-14 NOTE — ED Notes (Signed)
Patient verbalizes understanding of discharge instructions. Opportunity for questioning and answers were provided. Armband removed by staff, pt discharged from ED. Ambulated out to lobby with spouse  

## 2022-05-14 NOTE — ED Provider Notes (Signed)
[facility] EMERGENCY DEPT Provider Note   CSN: [id number] Arrival date & time: 05/14/22  1642     History  Chief Complaint  Patient presents with   Shortness of Breath    Sandra Burns is a 66 y.o. female.   Shortness of Breath    Pt had an episode 9 days ago of feeling fatigued and a sore throat.  Pt did a home covid test that was negative.  Pt started feeling a little better after a couple of days.  Pt states she has still been having some intermittent chills but has not measured a fever.  Yesterday she started to feel achy and fatigued again.  Some nasal congestion.  Some earache.  Pt does have severe allergies but does not know if that is related.  She has had some tightness in her chest though.  She has been using her inhaler.   Home Medications Prior to Admission medications   Medication Sig Start Date End Date Taking? Authorizing Provider  acetaminophen (TYLENOL) 500 MG tablet Take 500 mg by mouth every 6 (six) hours as needed.    [provider]  ALPRAZolam (XANAX) 0.5 MG tablet TAKE 1.5 PILLS IN MORNING, 1.5 A PILLS AT LUNCH, 1 AT [place], AND 1.5 PILLS AT BEDTIME, ALL AS NEED 04/11/22   Melony Overly T, PA-C  Artificial Tear Ointment (REFRESH P.M. OP) Place 1 application into both eyes at bedtime. Apply eye gel to both eyelids    [provider]  aspirin 81 MG chewable tablet Chew 81 mg by mouth daily. Patient not taking: Reported on 04/14/2022    [provider]  atenolol (TENORMIN) 25 MG tablet Take 12.5 mg by mouth 2 (two) times daily.     [provider]  budesonide (PULMICORT) 180 MCG/ACT inhaler Inhale 2 puffs into the lungs 2 (two) times daily.     [provider]  Cholecalciferol (VITAMIN D3) 10 MCG (400 UNIT) tablet Take by mouth. Patient not taking: Reported on 04/14/2022    [provider]  COVID-19 mRNA bivalent vaccine, Pfizer, (PFIZER COVID-19 VAC BIVALENT) injection Inject into the  muscle. Patient not taking: Reported on 04/26/2022 06/15/21   Judyann Munson, MD  dicyclomine (BENTYL) 20 MG tablet Take 1 tablet (20 mg total) by mouth 2 (two) times daily. 11/09/17   Linwood Dibbles, MD  fluticasone (FLONASE) 50 MCG/ACT nasal spray Place 1 spray into both nostrils daily as needed for allergies. 07/07/18   [provider]  gabapentin (NEURONTIN) 100 MG capsule Take 100 mg by mouth at bedtime as needed. Patient not taking: Reported on 04/14/2022    [provider]  ipratropium (ATROVENT HFA) 17 MCG/ACT inhaler Inhale 2 puffs into the lungs 4 (four) times daily.    [provider]  levalbuterol Pauline Aus HFA) 45 MCG/ACT inhaler Inhale 2 puffs into the lungs every 4 (four) hours as needed for wheezing.    [provider]  Multiple Vitamin (MULTIVITAMIN WITH MINERALS) TABS Take 1 tablet by mouth daily.    [provider]  mupirocin ointment (BACTROBAN) 2 % APPLY TO AFFECTED AREA 3 TIMES A DAY 08/23/19   [provider]  pantoprazole (PROTONIX) 40 MG tablet Take 40 mg by mouth daily as needed (GERD). For stomach    [provider]  Polyvinyl Alcohol-Povidone (REFRESH OP) Place 1 drop into both eyes daily as needed (dry eyes).    [provider]  ZIRGAN 0.15 % GEL APPLY 1 A SMALL AMOUNT IN AFFECTED EYE  5 TIMES A DAY UNTIL DIRECTED 06/10/19   [provider]      Allergies    Albuterol, Cephalosporins, Flagyl [metronidazole], Macrobid [nitrofurantoin macrocrystal], Neomy-bacit-polymyx-pramoxine, Penicillins, Sulfa antibiotics, Vancomycin, Vicodin [hydrocodone-acetaminophen], Amoxicillin, Ciprofloxacin, Latex, Bacitracin, Bacitracin-neomycin-polymyxin, Barium-containing compounds, Nitrofurantoin, Tobramycin, Decongest-aid [pseudoephedrine], Eggshell membrane (chicken) [egg shells], Hydrocodone-acetaminophen, Levofloxacin, Other, Prednisone, and Zithromax [azithromycin]    Review of Systems   Review of Systems   Respiratory:  Positive for shortness of breath.     Physical Exam Updated Vital Signs BP (!) 145/73   Pulse 63   Temp 98.4 F (36.9 C)   Resp 15   Ht 1.6 m (5\' 3" )   Wt 45.8 kg   SpO2 99%   BMI 17.89 kg/m  Physical Exam Vitals and nursing note reviewed.  Constitutional:      General: She is not in acute distress.    Appearance: She is well-developed. She is not diaphoretic.  HENT:     Head: Normocephalic and atraumatic.     Right Ear: External ear normal.     Left Ear: External ear normal.  Eyes:     General: No scleral icterus.       Right eye: No discharge.        Left eye: No discharge.     Conjunctiva/sclera: Conjunctivae normal.  Neck:     Trachea: No tracheal deviation.  Cardiovascular:     Rate and Rhythm: Normal rate and regular rhythm.  Pulmonary:     Effort: Pulmonary effort is normal. No respiratory distress.     Breath sounds: Normal breath sounds. No stridor. No wheezing or rales.  Abdominal:     General: Bowel sounds are normal. There is no distension.     Palpations: Abdomen is soft.     Tenderness: There is no abdominal tenderness. There is no guarding or rebound.  Musculoskeletal:        General: No tenderness or deformity.     Cervical back: Neck supple.  Skin:    General: Skin is warm and dry.     Findings: No rash.  Neurological:     General: No focal deficit present.     Mental Status: She is alert.     Cranial Nerves: No cranial nerve deficit (no facial droop, extraocular movements intact, no slurred speech).     Sensory: No sensory deficit.     Motor: No abnormal muscle tone or seizure activity.     Coordination: Coordination normal.  Psychiatric:        Mood and Affect: Mood normal.     ED Results / Procedures / Treatments   Labs (all labs ordered are listed, but only abnormal results are displayed) Labs Reviewed  SARS CORONAVIRUS 2 BY RT PCR  CBC  BASIC METABOLIC PANEL  TROPONIN I (HIGH SENSITIVITY)    EKG EKG  Interpretation  Date/Time:  Saturday May 14 2022 17:32:41 EDT Ventricular Rate:  57 PR Interval:  146 QRS Duration: 80 QT Interval:  392 QTC Calculation: 381 R Axis:   58 Text Interpretation: Sinus bradycardia Cannot rule out Anterior infarct , age undetermined Abnormal ECG When compared with ECG of 16-Sep-2018 12:58, No significant change since last tracing Confirmed by 18-Sep-2018 [phone]) on 05/14/2022 5:45:40 PM  Radiology DG Chest Port 1 View  Result Date: 05/14/2022 CLINICAL DATA:  Shortness of breath. EXAM: PORTABLE CHEST 1 VIEW COMPARISON:  Chest radiograph and CT dated 09/16/2018. FINDINGS: Background of emphysema. Focal opacity in the left mid lung field likely corresponds  to the area of scarring in the lingula as seen on the prior CT. Attention on follow-up imaging recommended. No new consolidation. There is no pleural effusion, or pneumothorax. The cardiac silhouette is within limits. No acute osseous pathology. Scoliosis and single spinal Harrington rod. IMPRESSION: No active disease. Electronically Signed   By: Elgie Collard M.D.   On: 05/14/2022 19:32    Procedures Procedures    Medications Ordered in ED Medications - No data to display  ED Course/ Medical Decision Making/ A&P Clinical Course as of 05/14/22 2209  Sat May 14, 2022  1942 DG Chest [place] 1 View Last x-ray without acute findings [JK]  1942 SARS Coronavirus 2 by RT PCR (hospital order, performed in [facility] hospital lab) *cepheid single result test* Anterior Nasal Swab Covid is negative [JK]  2208 Troponin I (High Sensitivity) Normal [JK]  2208 CBC Normal [JK]  2208 Basic metabolic panel Normal [JK]    Clinical Course User Index [JK] Linwood Dibbles, MD                           Medical Decision Making Problems Addressed: Upper respiratory tract infection, unspecified type: acute illness or injury  Amount and/or Complexity of Data Reviewed Labs: ordered. Decision-making details documented in ED  Course. Radiology: ordered and independent interpretation performed. Decision-making details documented in ED Course.   Patient presented to the ER for evaluation of respiratory symptoms fatigue also with some mild chest discomfort.  Symptoms ongoing for over a week.  ED work-up is reassuring.  No signs of pneumonia on x-ray.  Patient tested negative for COVID.  She does not have an oxygen requirement.  Her lungs are clear.  Vital signs are unremarkable.  Consider the possibility of anemia severe dehydration as well as myocarditis.  Laboratory tests show normal troponins normal CBC and metabolic panel.  May be a viral illness.  Evaluation and diagnostic testing in the emergency department does not suggest an emergent condition requiring admission or immediate intervention beyond what has been performed at this time.  The patient is safe for discharge and has been instructed to return immediately for worsening symptoms, change in symptoms or any other concerns.         Final Clinical Impression(s) / ED Diagnoses Final diagnoses:  Upper respiratory tract infection, unspecified type    Rx / DC Orders ED Discharge Orders     None         Linwood Dibbles, MD 05/14/22 2210

## 2022-05-14 NOTE — Discharge Instructions (Signed)
Continue your current medications.  Follow-up with your doctor next week to be rechecked if your symptoms have not improved.  Return as needed for fevers worsening symptoms

## 2022-05-31 ENCOUNTER — Telehealth: Payer: Self-pay | Admitting: Physician Assistant

## 2022-05-31 NOTE — Telephone Encounter (Signed)
Patient lvm requesting a call back from her provider. RTC no answer. Contact information # [phone]  Virtual appointment with Mardelle Matte 9/19, Rosey Bath 09/27/22

## 2022-05-31 NOTE — Telephone Encounter (Signed)
Patient called and the message I received was not clear so I called her. She has an OCD issue that revolves around an eating disorder as a result of stress from what she verbalized. She said she is quite frustrated with herself. She thinks she might benefit from an intensive OP program but co-pay is $200/day. Patient wonders if more sessions with you will be helpful for her. She has an appt with you on 9/19 and I told her that would be something best discussed with you at that visit.

## 2022-06-01 ENCOUNTER — Ambulatory Visit (INDEPENDENT_AMBULATORY_CARE_PROVIDER_SITE_OTHER): Payer: Medicare HMO | Admitting: Psychiatry

## 2022-06-01 ENCOUNTER — Ambulatory Visit: Payer: Medicare HMO | Admitting: Dermatology

## 2022-06-01 DIAGNOSIS — A310 Pulmonary mycobacterial infection: Secondary | ICD-10-CM

## 2022-06-01 DIAGNOSIS — F3341 Major depressive disorder, recurrent, in partial remission: Secondary | ICD-10-CM

## 2022-06-01 DIAGNOSIS — Z8659 Personal history of other mental and behavioral disorders: Secondary | ICD-10-CM

## 2022-06-01 DIAGNOSIS — F422 Mixed obsessional thoughts and acts: Secondary | ICD-10-CM

## 2022-06-01 DIAGNOSIS — Z636 Dependent relative needing care at home: Secondary | ICD-10-CM

## 2022-06-01 DIAGNOSIS — F5089 Other specified eating disorder: Secondary | ICD-10-CM | POA: Diagnosis not present

## 2022-06-01 DIAGNOSIS — F411 Generalized anxiety disorder: Secondary | ICD-10-CM

## 2022-06-01 NOTE — Progress Notes (Signed)
Psychotherapy Progress Note [facility] Sandra Moore, PhD LP  [name] Accident)    MRN: [id number] Therapy format: Individual psychotherapy Date: 06/01/2022      Start: 6:20p     Stop: 7:27p     Time Spent: 67 min Location: Telehealth visit -- I connected with this patient by an approved telecommunication method (audio only), with her informed consent, and verifying identity and patient privacy.  I was located at my office and patient at her home.  As needed, we discussed the limitations, risks, and security and privacy concerns associated with telehealth service, including the availability and conditions which currently govern in-person appointments and the possibility that 3rd-party payment may not be fully guaranteed and she may be responsible for charges.  After she indicated understanding, we proceeded with the session.  Also discussed treatment planning, as needed, including ongoing verbal agreement with the plan, the opportunity to ask and answer all questions, her demonstrated understanding of instructions, and her readiness to call the office should symptoms worsen or she feels she is in a crisis state and needs more immediate and tangible assistance.   Session narrative (presenting needs, interim history, self-report of stressors and symptoms, applications of prior therapy, status changes, and interventions made in session) Scheduled urgently based on feeling a downturn in her struggle with orthorexia and restricted eating, and calling the office to see if she could be seen more frequently, having looked into IOP and finding it prohibitive.  Rather longwinded about it, but essentially wants/needs to put in more time and work addressing diminished eating.  Saw ER recently about breathing, and PCP today re labs, which are pretty good despite her low intake.    As food OCD goes, finds herself less able sometimes to go ahead and eat for fear of getting  sick.  Come nightfall, may worry more about being malnourished and go eat.  Limited selection of what she eats -- cites relatively high quality cereal, oatmeal cookies, cheese, sometimes soy milk, 1 Boost per day, whole wheat bread, applesauce, no vegetables in a long time, whole wheat crackers, sometime Little [employer]bbie snack products, occasional bananas.  Whole grain b/c Sandra Burns went on it for diabetic control.  Foods that she may be hungry for, but OCD cancels, might include take out pizza.  Sandra Burns has developed a habit of checking restaurant grades, and Sandra Burns herself has a hx of hospitalized for food poisoning, c. 15 yrs ago, between parents' deaths.  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  It's since Sandra Burns's mind started eroding, and it looked like her fate to be stuck and lonely with caregiving.    Agrees maybe her struggle is an unconscious way of trying to not be here for that fate.  Validated being tired of this way of living.  Does wish she could hand over to someone else to just prepare her food and run her eating, and maybe her disorder is a way to get taken care of instead of feel tapped out by others seeking her care.  In light of the need to be listened to, cared for, probed available, competent, emotional support.  1 friend who has been good listener.  Behaviorally, OCD flares most with "new arrival" food -- lots of what-ifs about whether it was stored correctly, contaminated, etc.  The therapist "trick" she's been using is to take one bite/swallow as an act of courage, put it away, and give it no more than 24 hours before going on.    In  Discussed  vegetable options -- curbside from [employer], take out vegetable plate  then food focus happened after resettling home coming home to   Therapeutic modalities: Cognitive Behavioral Therapy and Solution-Oriented/Positive Psychology  Mental Status/Observations:  Appearance:   Casual     Behavior:  Appropriate  Motor:  Normal  Speech/Language:   Clear and Coherent  Affect:  Appropriate  Mood:  anxious and dysthymic  Thought process:  normal  Thought content:    Obsessions  Sensory/Perceptual disturbances:    WNL  Orientation:  Fully oriented  Attention:  Good    Concentration:  Good  Memory:  WNL  Insight:    Good  Judgment:   Good  Impulse Control:  Good   Risk Assessment: Danger to Self: No Self-injurious Behavior: No Danger to Others: No Physical Aggression / Violence: No Duty to Warn: No Access to Firearms a concern: No  Assessment of progress:  progressing  Diagnosis:   ICD-10-CM   1. 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 Orthorexia nervosa  F50.89     3. Recurrent major depressive disorder, in partial remission (Sandra Burns)  F33.41     4. Caregiver stress  Z63.6     5. OCD - contamination/illness type  F42.2     6. History of posttraumatic stress disorder (PTSD)  Z86.59     7. 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      Plan:  Today Go back on wait list, make use of Tues/Fri urgent schedule option Challenge to 2 bites instead of 1, and 6 hours instead of 24, see about 3 hrs Challenge to widen food choices -- vegetable takeout, e.g., even pizza Imagine [place] present, as if a hologram, beckoning to eat anyway when trying to get over to  Ongoing Physical health Stretch liberally as able for orthopedic/spinal issues Observe reasonable standards for airborne infection control given her lung condition For sleep, continue to establish electronics curfew For weight maintenance, ensure good calorie density in food choices For  health, ensure good variety, especially adequate vegetable and fruit nutrients Fear of illness Continue exposures to perceived risk of contamination she knows is miniscule or well-controlled Continue self-help through [employer] Take food-related dreams as a sign of restoring normal appetite Reframe the moment she feels like avoiding food as her golden opportunity to say "yes" and be in control every bit a much as "no" Caregiver stress Seek adequate time away from home and perpetual care Continue reframing Sandra Burns's behavior sometimes as second childhood, not intentional problems Pursue reasonable collaborations with his treatment team for cognitive health Look into respite care and facility and in-home care alternatives for Sandra Burns to get ahead on eventual developments Social support Work assertiveness with friends to get adequate time listened to  Be as prompt as possible to give grace,  If interested, connect with dementia support organization(s) Family strife Be willing to tell [place] he gets in his own way with pestering, not just time him out Continue stoic approach to Sandra Burns's resentment/rejection.  OK to keep touch with ex-H. Other recommendations/advice as may be noted above Continue to utilize previously learned skills  ad lib Maintain medication as prescribed and work faithfully with relevant prescriber(s) if any changes are desired or seem indicated Call the clinic on-call service, 988/hotline, 911, or present to [facility] or ER if any life-threatening psychiatric crisis Return for session(s) already scheduled. Already scheduled visit in this office 06/14/2022.  [name], PhD Sandra Moore, PhD LP Clinical Psychologist, [facility] [address], [place], [place] [address] [phone]

## 2022-06-03 ENCOUNTER — Ambulatory Visit: Payer: Medicare HMO | Admitting: Physician Assistant

## 2022-06-07 ENCOUNTER — Encounter: Payer: Self-pay | Admitting: Physician Assistant

## 2022-06-14 ENCOUNTER — Ambulatory Visit: Payer: Medicare HMO | Admitting: Psychiatry

## 2022-06-14 DIAGNOSIS — H579 Unspecified disorder of eye and adnexa: Secondary | ICD-10-CM

## 2022-06-14 DIAGNOSIS — F422 Mixed obsessional thoughts and acts: Secondary | ICD-10-CM | POA: Diagnosis not present

## 2022-06-14 DIAGNOSIS — F5089 Other specified eating disorder: Secondary | ICD-10-CM | POA: Diagnosis not present

## 2022-06-14 DIAGNOSIS — E638 Other specified nutritional deficiencies: Secondary | ICD-10-CM

## 2022-06-14 DIAGNOSIS — F411 Generalized anxiety disorder: Secondary | ICD-10-CM | POA: Diagnosis not present

## 2022-06-14 DIAGNOSIS — Z8659 Personal history of other mental and behavioral disorders: Secondary | ICD-10-CM

## 2022-06-14 DIAGNOSIS — Z630 Problems in relationship with spouse or partner: Secondary | ICD-10-CM

## 2022-06-14 DIAGNOSIS — Z636 Dependent relative needing care at home: Secondary | ICD-10-CM

## 2022-06-14 DIAGNOSIS — F3341 Major depressive disorder, recurrent, in partial remission: Secondary | ICD-10-CM

## 2022-06-14 NOTE — Progress Notes (Signed)
Psychotherapy Progress Note [facility] Sandra [name], PhD LP  [name])    MRN: [id number] Therapy format: Individual psychotherapy Date: 06/14/2022      Start: 2:18p     Stop: 3:18p     Time Spent: 60 min Location: Telehealth visit -- I connected with this patient by an approved telecommunication method (video), with her informed consent, and verifying identity and patient privacy.  I was located at my office and patient at her home.  As needed, we discussed the limitations, risks, and security and privacy concerns associated with telehealth service, including the availability and conditions which currently govern in-person appointments and the possibility that 3rd-party payment may not be fully guaranteed and she may be responsible for charges.  After she indicated understanding, we proceeded with the session.  Also discussed treatment planning, as needed, including ongoing verbal agreement with the plan, the opportunity to ask and answer all questions, her demonstrated understanding of instructions, and her readiness to call the office should symptoms worsen or she feels she is in a crisis state and needs more immediate and tangible assistance.   Session narrative (presenting needs, interim history, self-report of stressors and symptoms, applications of prior therapy, status changes, and interventions made in session) Tough time lately with Sandra Burns being gamey and juvenile just answering a question when some mail came, allegedly 30 minutes of hedging and evasiveness that wound up triggering her to become the "angry mama" he was apparently trying to prevent.  [place] with draining conversations with friends and irritable bladder problems.  Notes again her regret ever getting into this marriage, reveals she was driven to the point of kicking the door twice, and Sandra Burns making fun of her for that, too, just about got her to leave for a hotel.  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  Inevitably provoked her to recall Sandra Burns's multiple infidelities, never really reconciled before other problems hit.  On recollection, she saw some mental status issues even 8 years ago when they moved into this home -- things that were more impulsive, childlike, e.g., licking and reusing utensils and other sanitation issues, and doddering uncertainty in the face of change.  Knows his MRI has shown lacunar strokes, diffuse foci, and vascular changes, possible they were beginning even then.  Ashamed of the resentment she realizes she's feeling toward him, but knows he wouldn't give anything like the same care for her she does him, prone to recall instances of him putting off things he agreed to do to take care of her, like wash some towels when she was recovering from oophorectomy.  Knows he is sensitized to her, too, attrib to complicated hx with his mother (depressive, agoraphobic, on the couch a lot, and she probably had Lewy body dementia later).  Compassionately, she does figure Sandra Burns gets scared when she is out of commission.  Question about her MDD diagnosis -- differentiated from particularly severe/acute/suicidal, which is what she thought "Major" meant.  Does not like having multiple diagnoses apply, but agrees that they do.   Probed social support for caregiver stress -- has a MSW Sandra Burns, available from Sandra Burns dementia program, will speak tomorrow.  Friend Sandra Burns is too much living up to her nickname right now, unable to focus and provide empathic listening.  Wants to be seen more often.  Discussed options.  Doing incrementally better braving food, has gained back all she lost while sick, +  1lb.  Employing the 2-bite strategy from last time, going further into first exposures, and making a more substantial sandwich instead of little "safe" bits to eat.  Affirmed and encouraged.  Therapeutic modalities: Cognitive Behavioral  Therapy, Solution-Oriented/Positive Psychology, and Ego-Supportive  Mental Status/Observations:  Appearance:   Casual     Behavior:  Appropriate  Motor:  Normal  Speech/Language:   Clear and Coherent  Affect:  Appropriate  Mood:  dysthymic  Thought process:  normal  Thought content:    Obsessions  Sensory/Perceptual disturbances:    WNL  Orientation:  Fully oriented  Attention:  Good    Concentration:  Good  Memory:  WNL  Insight:    Good  Judgment:   Good  Impulse Control:  Fair   Risk Assessment: Danger to Self: No Self-injurious Behavior: No Danger to Others: No Physical Aggression / Violence: No Duty to Warn: No Access to Firearms a concern: No  Assessment of progress:  progressing  Diagnosis:   ICD-10-CM   1. 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 OCD - contamination/illness type  F42.2     3. Orthorexia nervosa  F50.89     4. Recurrent major depressive disorder, in partial remission ([facility])  F33.41     5. Caregiver stress  Z63.6     6. History of posttraumatic stress disorder (PTSD)  Z86.59     7. Imbalanced nutrition  E63.8     8. Relationship problem between partners  Z63.0     9. 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  H57.9      Plan:  Physical health Stretch liberally as able for orthopedic/spinal issues Observe reasonable standards for airborne infection control given her lung condition For sleep, continue to establish electronics curfew For weight maintenance, ensure good calorie density in food choices and  For health, ensure good variety, especially adequate vegetable and fruit nutrients, wheat OK Fear of illness Reframe the moment she feels like avoiding food as her golden opportunity to say "yes" and be in control every bit a much as "no" Continue exposures to perceived risk of contamination she knows is miniscule or well-controlled Try reducing arbitrary wait time for food and  increasing number of bites taken as a "test" Use imaginary [place] as "coach" when facing anxiety  Take food-related dreams as a sign of restoring normal appetite Continue self-help through [employer] Caregiver stress Seek adequate time away from home and perpetual care -- may be opportunities safe enough, and he is capable of managing some time alone Continue reframing Sandra Burns's behavior sometimes as second childhood, not intentional problems As long as history of unfaithfulness is not going to be addressed, seek to forgive a a form of unburdening resentment Pursue reasonable collaborations with his treatment team for cognitive health Look into respite care and facility and in-home care alternatives for Sandra Burns to get ahead on eventual developments Social support Work assertiveness with friends to get adequate time listened to  Continuing discretion disclosing to friends and asking support and her turn  If interested, connect with dementia support organization(s) Family strife Be willing to tell B [name] he gets in his own way with pestering, not just time him out Continue stoic approach to [name] resentment/rejection.  OK to keep touch with ex-H. Other recommendations/advice as may be noted above Continue to utilize previously learned skills ad lib Maintain medication as prescribed and work faithfully with relevant prescriber(s) if any changes are  desired or seem indicated Call the clinic on-call service, 988/hotline, 911, or present to [facility] or ER if any life-threatening psychiatric crisis Return in about 1 week (around 06/21/2022) for time as available. Already scheduled visit in this office 06/28/2022.  [name], PhD [name], PhD LP Clinical Psychologist, [facility] [address] [place], [place] [address] [phone]

## 2022-06-28 ENCOUNTER — Ambulatory Visit (INDEPENDENT_AMBULATORY_CARE_PROVIDER_SITE_OTHER): Payer: Medicare HMO | Admitting: Psychiatry

## 2022-06-28 DIAGNOSIS — F3341 Major depressive disorder, recurrent, in partial remission: Secondary | ICD-10-CM

## 2022-06-28 DIAGNOSIS — F411 Generalized anxiety disorder: Secondary | ICD-10-CM

## 2022-06-28 DIAGNOSIS — F422 Mixed obsessional thoughts and acts: Secondary | ICD-10-CM | POA: Diagnosis not present

## 2022-06-28 DIAGNOSIS — R69 Illness, unspecified: Secondary | ICD-10-CM

## 2022-06-28 DIAGNOSIS — Z9189 Other specified personal risk factors, not elsewhere classified: Secondary | ICD-10-CM

## 2022-06-28 DIAGNOSIS — F5089 Other specified eating disorder: Secondary | ICD-10-CM | POA: Diagnosis not present

## 2022-06-28 DIAGNOSIS — Z630 Problems in relationship with spouse or partner: Secondary | ICD-10-CM

## 2022-06-28 DIAGNOSIS — A310 Pulmonary mycobacterial infection: Secondary | ICD-10-CM

## 2022-06-28 DIAGNOSIS — Z8659 Personal history of other mental and behavioral disorders: Secondary | ICD-10-CM

## 2022-06-28 DIAGNOSIS — Z636 Dependent relative needing care at home: Secondary | ICD-10-CM

## 2022-06-28 NOTE — Progress Notes (Signed)
Psychotherapy Progress Note [facility] [name], PhD LP  [name])    MRN: [id number] Therapy format: Individual psychotherapy Date: 06/28/2022      Start: 4:23p     Stop: 5:27p     Time Spent: 64 min Location: In-person   Session narrative (presenting needs, interim history, self-report of stressors and symptoms, applications of prior therapy, status changes, and interventions made in session) Having some swelling from sitting and more salt of late.  Still odd diet, e.g., high protein Special K and a couple Pop Tarts this morning.  Now weighing 102.2#, rising a bit, seeing a little bit of fleshing back out.  Goals to expand the circle of foods, keep getting weight to rise a few lbs.  Seeing herself better go ahead and partake of food the same day it's brought home, just washing hands after touching packaging.  The idea of eating out more seems to be a nonstarter with [name]; he may agree in principle, but even when he is just eager to do it tonight, she feels pressure, as if from her father, and she locks up in resistance.  Got so wearied yesterday she wanted to leave.  One issue is how he has never made fast friends, puts all his attachment eggs in her basket.  Encouraged to visualize her father, actually, telling her from beyond, that he doesn't need that kind of compliance any more, no need to rebel against it, either, just free to do what makes sense to her.    Has been able to make more 2-bite forays, and some 3rd-bites, sometimes more, before pausing a food.  Encouraged to look at OCD challenges less as times to be controlled than times to let herself pass through, same as she did beating panic attacks before.  Encouraged further in extending range, amount, and first-bites tolerance for food.  Overall stress up a lot.  Some of it telecom (dropping land line, customer service issues), a good bit of it a 1-sided, 4-hr conversation with her  lonely, monopolizing cousin Cora yesterday, and other aspects in [name]'s flagging impulse control (e.g., was just going to charge his new phone then tried to activate it and it involved repeatedly asking her to come help.  [name] also now having 3 days at a time in altered states and cognitive difficulties, even to the point of not being able to figure out his lines recording new voicemail greeting.  Finding him more childlike and impulsive overall more of the time ,which could be sundowning hard with autumn.  Queried him about taking her food w/o permission and he just said, "I wanted it."  Epiphany had that she went through that kind of thing with her son when he was ODD, before he manifested later as sociopathic, and maybe that's what makes it so electrifying to see [name] be so juvenile, taxing, and gamey.  Affirmed that it's at least a quad threat -- wearying to deal with childish behavior in the first place, lonely to feel like the only adult in the room, evoking old resentment serving Mist[place] when he's betrayed her in the past, and then the dismal quality of feeling like it's a replay of raising [name].    Re. [name]'s condition, briefed her on dextromethorphan as an emerging option for neuroprotection and adjunctive treatment of dementia, possibly very appropriate to his condition.    Funny story of friend [name], a former pt, and her telling of coming back to the  office last week and getting so flooded with gratitude for past service she involuntarily hugged TX and temporarily mortified herself.    Therapeutic modalities: Cognitive Behavioral Therapy, Solution-Oriented/Positive Psychology, and Ego-Supportive  Mental Status/Observations:  Appearance:   Casual     Behavior:  Appropriate  Motor:  Normal  Speech/Language:   Clear and Coherent  Affect:  Appropriate  Mood:  dysthymic  Thought process:  normal  Thought content:    Obsessions  Sensory/Perceptual disturbances:    WNL   Orientation:  Fully oriented  Attention:  Good    Concentration:  Good  Memory:  WNL  Insight:    Good  Judgment:   Good  Impulse Control:  Good   Risk Assessment: Danger to Self: No Self-injurious Behavior: No Danger to Others: No Physical Aggression / Violence: No Duty to Warn: No Access to Firearms a concern: No  Assessment of progress:  stabilized  Diagnosis:   ICD-10-CM   1. 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 OCD - contamination/illness type  F42.2     3. Orthorexia nervosa  F50.89     4. Caregiver stress  Z63.6     5. Relationship problem between partners  Z63.0     6. History of posttraumatic stress disorder (PTSD)  Z86.59     7. Recurrent major depressive disorder, in partial remission ([place])  F33.41     8. At risk for imbalanced nutrition  Z91.89     9. r/o Lupus (SLE) vs. unspecified complex AI disorder  R69     10. 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.0      Plan:  Fear of illness Reframe the moment she feels like avoiding food as her golden opportunity to say "yes" and be in control every bit a much as "no" Continue exposures to perceived risk of contamination she knows is miniscule or well-controlled Try reducing arbitrary wait time for food and increasing number of bites taken as a "test" Use imaginary [place] as "coach" when facing anxiety  Take food-related dreams as a sign of restoring normal appetite Continue self-help through [employer] Caregiver stress Seek adequate time away from home and perpetual care -- may be opportunities safe enough, and he is capable of managing some time alone Continue reframing [name]'s behavior sometimes as second childhood, less intentional, but differentiate it from raising [name] As long as history of unfaithfulness is not going to be addressed, seek to forgive a a form of unburdening resentment Pursue reasonable collaborations with his treatment team for cognitive health Look  into respite care and facility and in-home care alternatives for [name] to get ahead on eventual developments Physical health Stretch liberally as able for orthopedic/spinal issues Observe reasonable standards for airborne infection control given her lung condition For sleep, continue to establish electronics curfew For weight maintenance, ensure good calorie density in food choices and  For health, ensure good variety, especially adequate vegetable and fruit nutrients, wheat OK Social support Work assertiveness with friends to get adequate time listened to  Continuing discretion disclosing to friends and asking support and her turn  If interested, connect with dementia support organization(s) Family strife Be willing to tell [place] he gets in his own way with pestering, not just time him out Continue stoic approach to [name] resentment/rejection.  OK to keep touch with ex-H. Other recommendations/advice as may be noted above Continue to utilize previously learned skills ad lib Maintain medication as prescribed and work  faithfully with relevant prescriber(s) if any changes are desired or seem indicated Call the clinic on-call service, 988/hotline, 911, or present to [facility] or ER if any life-threatening psychiatric crisis Return for session(s) already scheduled. Already scheduled visit in this office 07/04/2022.  [name], PhD [name], PhD LP Clinical Psychologist, [facility] [address] [place], [place] [address] [phone]

## 2022-07-04 ENCOUNTER — Ambulatory Visit (INDEPENDENT_AMBULATORY_CARE_PROVIDER_SITE_OTHER): Payer: Medicare HMO | Admitting: Psychiatry

## 2022-07-04 DIAGNOSIS — F3341 Major depressive disorder, recurrent, in partial remission: Secondary | ICD-10-CM

## 2022-07-04 DIAGNOSIS — F422 Mixed obsessional thoughts and acts: Secondary | ICD-10-CM

## 2022-07-04 DIAGNOSIS — F411 Generalized anxiety disorder: Secondary | ICD-10-CM | POA: Diagnosis not present

## 2022-07-04 DIAGNOSIS — A310 Pulmonary mycobacterial infection: Secondary | ICD-10-CM

## 2022-07-04 DIAGNOSIS — Z8659 Personal history of other mental and behavioral disorders: Secondary | ICD-10-CM

## 2022-07-04 DIAGNOSIS — F5089 Other specified eating disorder: Secondary | ICD-10-CM

## 2022-07-04 DIAGNOSIS — Z630 Problems in relationship with spouse or partner: Secondary | ICD-10-CM

## 2022-07-04 DIAGNOSIS — R69 Illness, unspecified: Secondary | ICD-10-CM

## 2022-07-04 DIAGNOSIS — Z9189 Other specified personal risk factors, not elsewhere classified: Secondary | ICD-10-CM

## 2022-07-04 DIAGNOSIS — Z636 Dependent relative needing care at home: Secondary | ICD-10-CM

## 2022-07-04 NOTE — Progress Notes (Signed)
Psychotherapy Progress Note [facility] [name], PhD LP  [name])    MRN: [id number] Therapy format: Individual psychotherapy Date: 07/04/2022      Start: 10:14a     Stop: 11:04a     Time Spent: 50 min Location: Telehealth visit -- I connected with this patient by an approved telecommunication method (video), with her informed consent, and verifying identity and patient privacy.  I was located at my office and patient at her home.  As needed, we discussed the limitations, risks, and security and privacy concerns associated with telehealth service, including the availability and conditions which currently govern in-person appointments and the possibility that 3rd-party payment may not be fully guaranteed and she may be responsible for charges.  After she indicated understanding, we proceeded with the session.  Also discussed treatment planning, as needed, including ongoing verbal agreement with the plan, the opportunity to ask and answer all questions, her demonstrated understanding of instructions, and her readiness to call the office should symptoms worsen or she feels she is in a crisis state and needs more immediate and tangible assistance.   Session narrative (presenting needs, interim history, self-report of stressors and symptoms, applications of prior therapy, status changes, and interventions made in session) Fell asleep 4:30a, after lying down obsessed, pretty tired this morning.  String of bad days lately.  Finally disconnected the land line and it got pretty quiet.  Wil[place] Mylar was 30th anniversary, and [name] seemed to not particularly care.  Had thought about going out to her brother's house [place] the country, have a little gathering outside there, figures this is the last round number they'll see together.  Had thought of going to [place] to visit the historical site, especially as she is said to be related to [place] and/or Pia Mau.  Dismayed herself by seeing her friends take risks with COVID infection, new cases contracting it, and risk of developing long COVID.    [name] seems overall more apathetic, suggesting calling off Christmas or just getting gift cards for each other.  Thinks maybe his apparent apathy has to do with the sheer difficulty putting plans together and thinking through.  Knows she was in pain yesterday, too, from back hurting and having licked up dust (affected lungs) from dusting the day before.  [name] made a sour comment, telling her to ask [place] if some reaction of hers was right/mature, and she reflexively told him it's her time, she pays for it, and she'll decide how to use it.  (Challenged to recognize when not to argue the content when he gets sour, just cut to what he wants to get across, e.g., "I'm mad with you".)  Lost an old friend this week, a former [employer] teaching partner and recovering alcoholic who once took [name] to rehab, found dead at 24.  Recently rumored to have relapsed, too.  Overall, gets her thinking about mortality and having quality time in her last years.  Cousin keeps irritating her by suggesting blithely she "put him [[name]] somewhere", when it's very complicated financially.  Social worker at the [facility] dementia program assures her it will get easier when he gets worse enough to make guardian-like decisions.  At home, they keep some different hours, but she also has to stay on guard for whether he will do something she has to fix.    Pretty sure she hasn't been out of the condo since she went to the ED (8/19).  Grew up  alone a lot, does enjoy writing and crafts, so it's not a great problem to stay in except for the constant stress of taking care of things for him and having him .  Yesterday came to tears for how sad the whole scene really is, and her thoughts spiraling while she lay there from about 1:30a-4:30a about how she's never going to eat right because she only does it when the  stress is off...got herself to get up and get some cereal, cheese, and a Boost, was able to fall asleep readily for a couple hours.  Knows she's avoiding [name] more, too, e.g., camping out on the computer to keep from having to hear him make comments throughout the TV he watches.  Curiously, he's staying up to match her more, and he averts his eyes if she comes out of the shower naked, like a little boy seeing his mother.  All feels more lonely and alienating.  Personally, fell back on nutrition, caught herself thinking about possibly fasting to death, which is part of when she got up and had more substantial calories, trying to fight back against sickness and despair.  Therapeutic modalities: Cognitive Behavioral Therapy, Solution-Oriented/Positive Psychology, and Humanistic/Existential  Mental Status/Observations:  Appearance:   Casual     Behavior:  Appropriate  Motor:  Normal  Speech/Language:   Clear and Coherent  Affect:  Appropriate  Mood:  depressed  Thought process:  normal  Thought content:    WNL  Sensory/Perceptual disturbances:    WNL  Orientation:  Fully oriented  Attention:  Good    Concentration:  Fair  Memory:  WNL  Insight:    Good  Judgment:   Good  Impulse Control:  Good   Risk Assessment: Danger to Self: No Self-injurious Behavior: No Danger to Others: No Physical Aggression / Violence: No Duty to Warn: No Access to Firearms a concern: No  Assessment of progress:  stabilized  Diagnosis:   ICD-10-CM   1. 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 OCD - contamination/illness type  F42.2     3. Orthorexia nervosa  F50.89     4. Caregiver stress  Z63.6     5. Relationship problem between partners  Z63.0     6. History of posttraumatic stress disorder (PTSD)  Z86.59     7. Recurrent major depressive disorder, in partial remission ([place])  F33.41     8. At risk for imbalanced nutrition  Z91.89     9. r/o Lupus (SLE) vs.  unspecified complex AI disorder  R69     10. 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.0      Plan:  Fear of illness Reframe the moment she feels like avoiding food as her golden opportunity to say "yes" and be in control every bit a much as saying "no" Continue exposures to perceived risk of contamination she knows is minuscule or well-controlled Try reducing arbitrary wait time for food and increasing number of bites taken as a "test" Use imaginary [place] as "coach" when facing anxiety  Take food-related dreams as a sign of restoring normal appetite Continue self-help through [employer] Caregiver stress Seek adequate time away from home and perpetual care -- may be opportunities safe enough, where [name] is capable of managing some time alone Continue reframing [name]'s behavior sometimes as second childhood, less intentional, but differentiate it from raising [name] As long as history of unfaithfulness is not going to be addressed, seek to  forgive as a form of unburdening resentment Pursue reasonable collaborations with his treatment team for cognitive health Look into respite care and facility and in-home care alternatives for [name] to get ahead on eventual developments Physical health Stretch liberally as able for orthopedic/spinal issues Observe reasonable standards for airborne infection control given her lung condition For sleep, continue to establish electronics curfew For weight maintenance, ensure good calorie density in food choices and  For health, ensure good variety, especially adequate vegetable and fruit nutrients, wheat OK Social support Work assertiveness with friends to get adequate time listened to  Continuing discretion disclosing to friends and asking support and her turn  If interested, connect with dementia support organization(s) Family strife Be willing to tell [place] he gets in his own way with pestering, not just time him out Continue stoic approach to  [name] resentment/rejection.  OK to keep touch with ex-H. Other recommendations/advice as may be noted above Continue to utilize previously learned skills ad lib Maintain medication as prescribed and work faithfully with relevant prescriber(s) if any changes are desired or seem indicated Call the clinic on-call service, 988/hotline, 911, or present to [facility] or ER if any life-threatening psychiatric crisis Return for as already scheduled. Already scheduled visit in this office 07/20/2022.  [name], PhD [name], PhD LP Clinical Psychologist, [facility] [address] [place], [place] [address] [phone]

## 2022-07-06 ENCOUNTER — Other Ambulatory Visit: Payer: Self-pay

## 2022-07-06 MED ORDER — COVID-19 MRNA VAC-TRIS(PFIZER) 30 MCG/0.3ML IM SUSY
PREFILLED_SYRINGE | INTRAMUSCULAR | 0 refills | Status: AC
  Filled 2022-07-11 – 2022-10-05 (×2): qty 0.3, 1d supply, fill #0

## 2022-07-11 ENCOUNTER — Other Ambulatory Visit: Payer: Self-pay

## 2022-07-15 IMAGING — CT CT ABD-PELV W/ CM
2 of 5 series · 16 of 46 positions shown, 18 images · IV contrast (agent unspecified)
Comparison: 04/06/2021

CLINICAL DATA: 10 lb weight loss, epigastric pain

EXAM:
CT ABDOMEN AND PELVIS WITH CONTRAST
TECHNIQUE: Multidetector CT imaging of the abdomen and pelvis was performed
using the standard protocol following bolus administration of
intravenous contrast.

[Series 2: abd pelvis (person_name) 5.00 · axial · 0.62mm/px · z∈[-1510,-1140]mm · 13 of 84 slices shown, 15 images]
[im 5/84  soft-tissue]
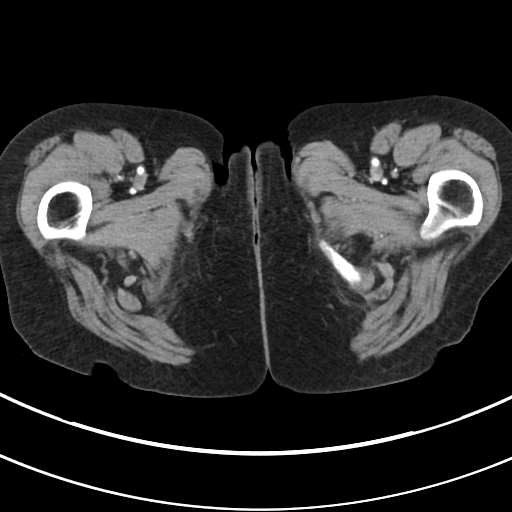
[im 5/84  bone]
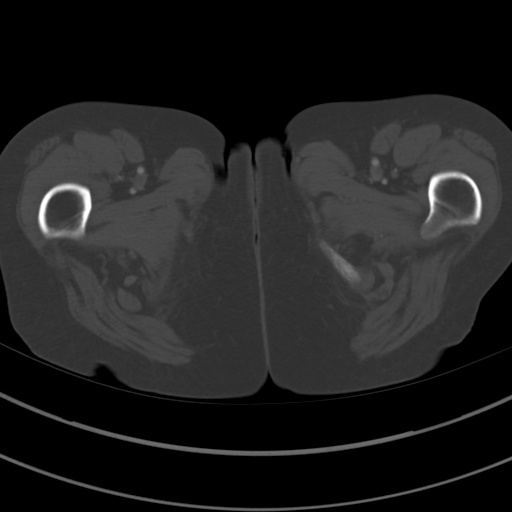
[im 10/84  soft-tissue]
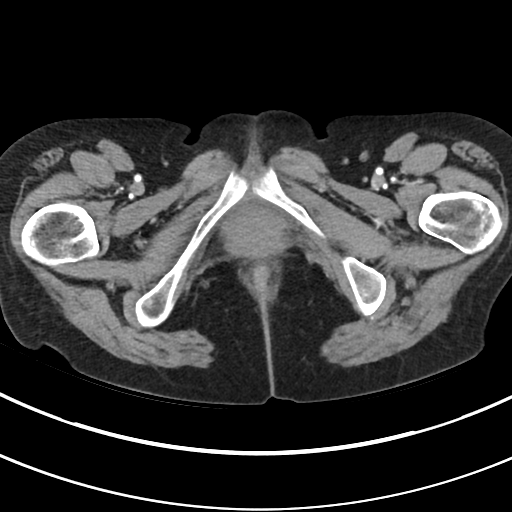
[im 19/84  soft-tissue]
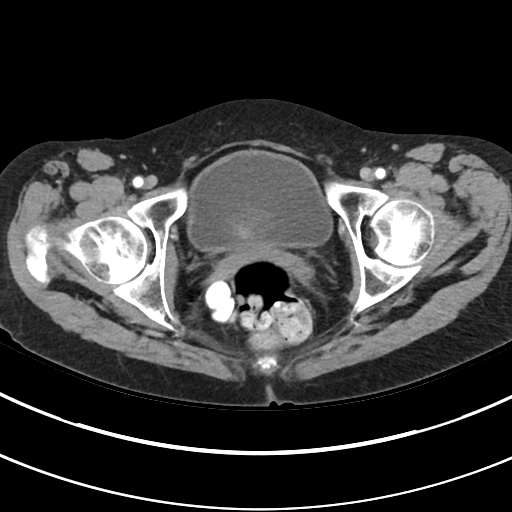
[im 24/84  soft-tissue]
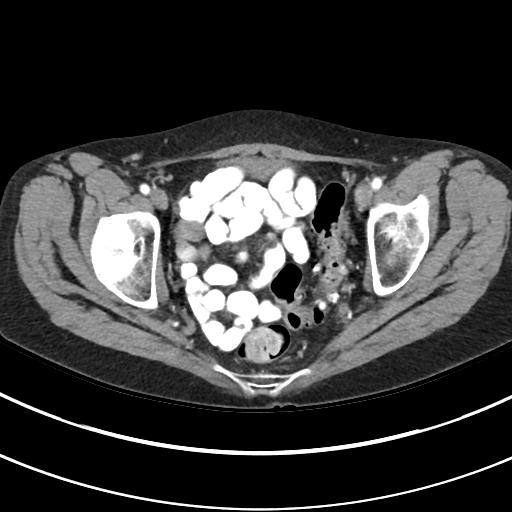
[im 28/84  soft-tissue]
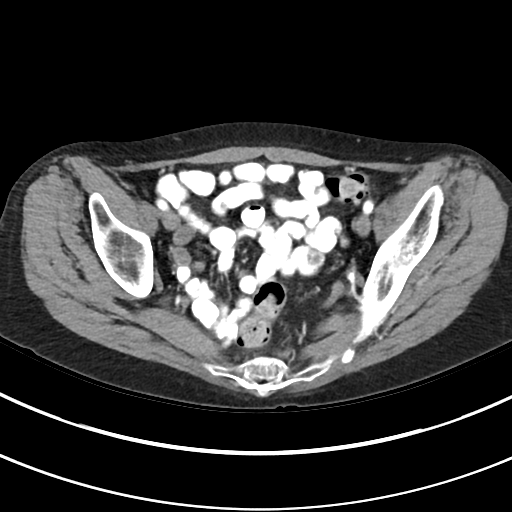
[im 37/84  soft-tissue]
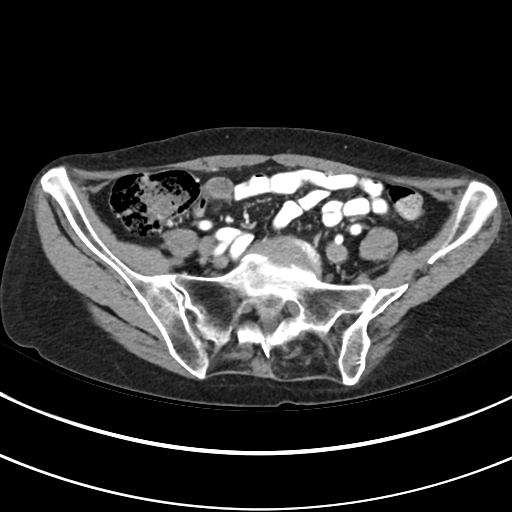
[im 42/84  soft-tissue]
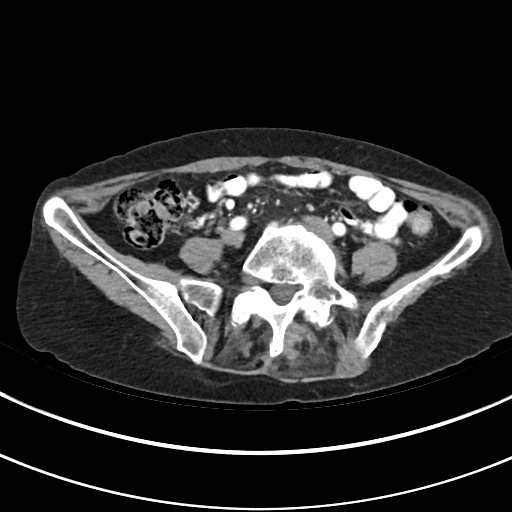
[im 47/84  soft-tissue]
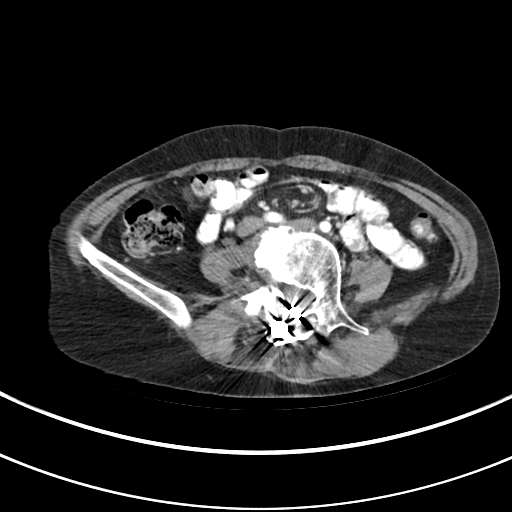
[im 56/84  soft-tissue]
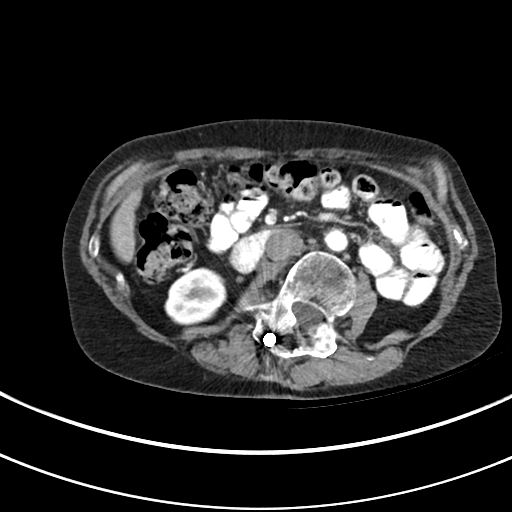
[im 56/84  bone]
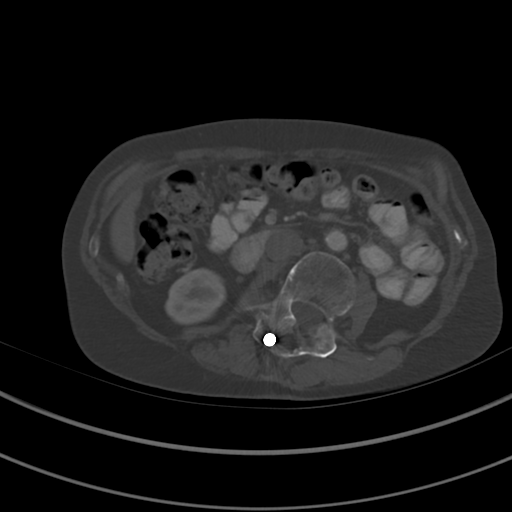
[im 60/84  soft-tissue]
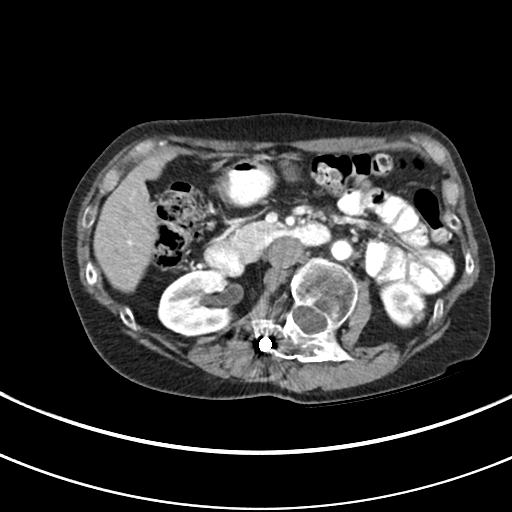
[im 65/84  soft-tissue]
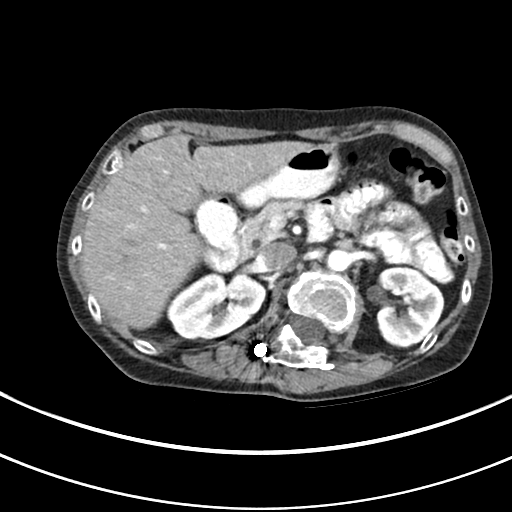
[im 74/84  soft-tissue]
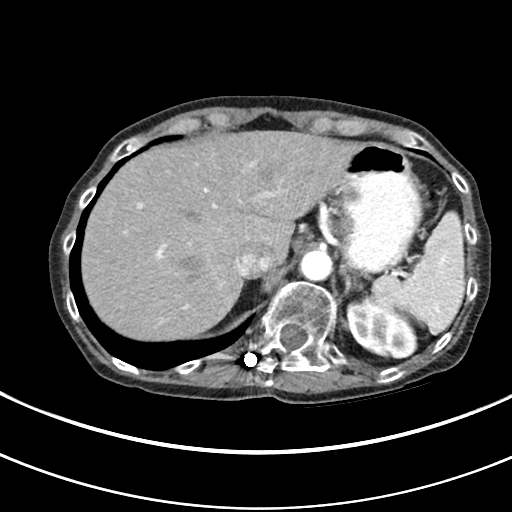
[im 79/84  soft-tissue]
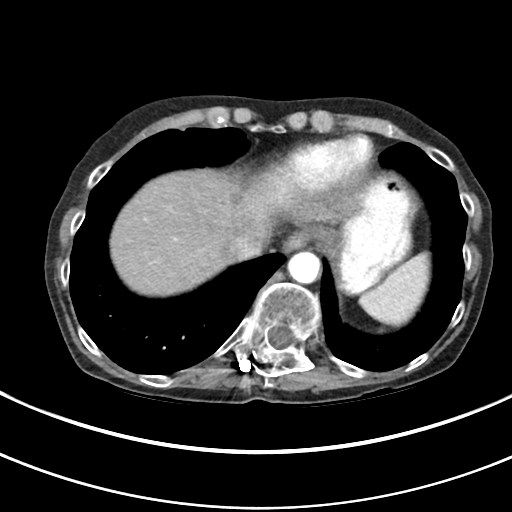

[Series 4: coronals abd pelvis (person_name) 2.00 cor · coronal · 0.62mm/px · 3 of 102 slices shown]
[im 45/102  soft-tissue]
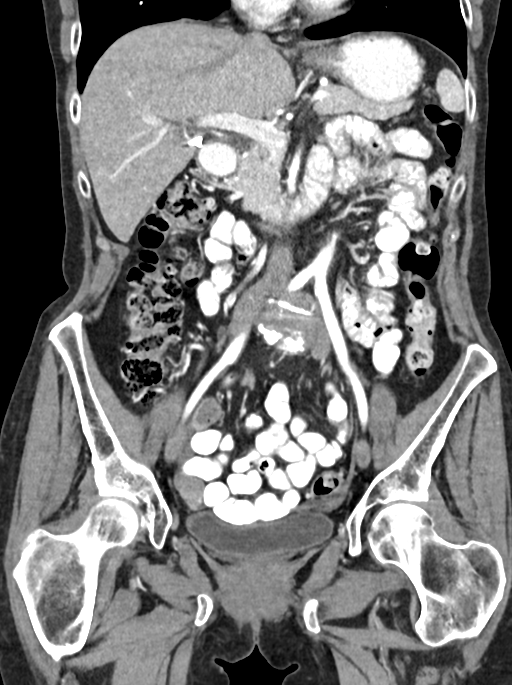
[im 57/102  soft-tissue]
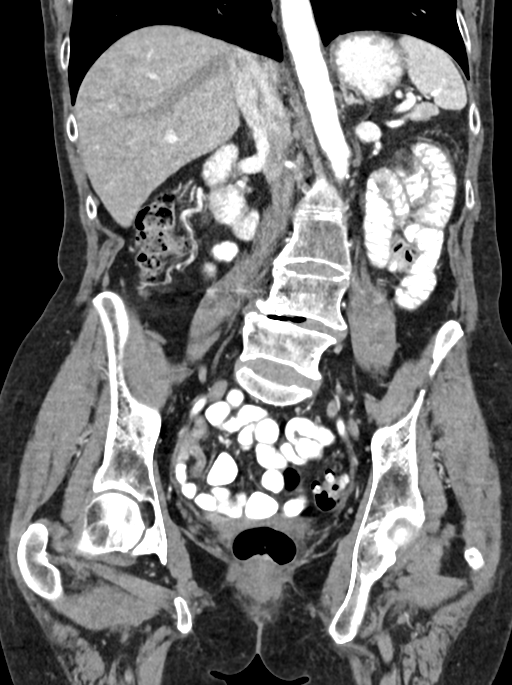
[im 68/102  soft-tissue]
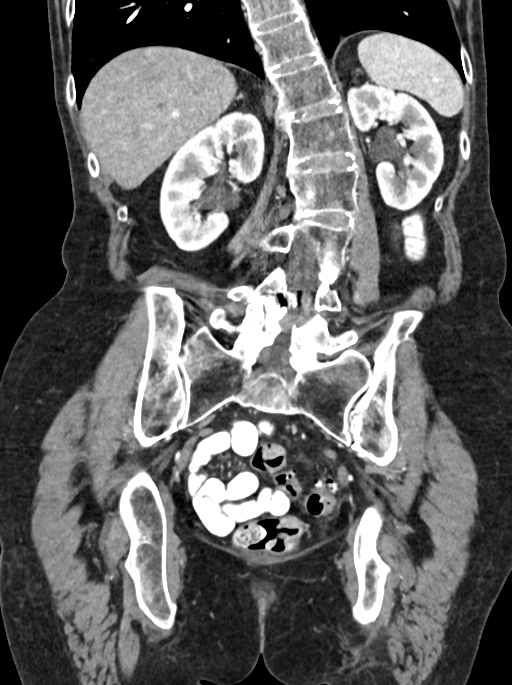

[16 of 46 positions shown; findings below may reference images not displayed]

RADIATION DOSE REDUCTION: This exam was performed according to the
departmental dose-optimization program which includes automated
exposure control, adjustment of the mA and/or kV according to
patient size and/or use of iterative reconstruction technique.

CONTRAST:  75mL OMNIPAQUE IOHEXOL 300 MG/ML  SOLN
FINDINGS: Lower chest: No acute abnormality.

Hepatobiliary: No focal liver abnormality is seen. Status post
cholecystectomy. No biliary dilatation.

Pancreas: No focal abnormality or ductal dilatation.

Spleen: Central calcification.  Normal size.

Adrenals/Urinary Tract: No adrenal abnormality. No focal renal
abnormality. No stones or hydronephrosis. Urinary bladder is
unremarkable.

Stomach/Bowel: Sigmoid diverticulosis. No active diverticulitis.
Stomach and small bowel decompressed, unremarkable.

Vascular/Lymphatic: Aortic atherosclerosis. No evidence of aneurysm
or adenopathy.

Reproductive: Prior hysterectomy.  No adnexal masses.

Other: No free fluid or free air.

Musculoskeletal: No acute bony abnormality. Posterior Harrington rod
in the lumbar spine. Leftward scoliosis and degenerative changes.
IMPRESSION: No explanation for the patient's weight loss.

Sigmoid diverticulosis.

Aortic atherosclerosis.

No acute findings.

## 2022-07-20 ENCOUNTER — Ambulatory Visit (INDEPENDENT_AMBULATORY_CARE_PROVIDER_SITE_OTHER): Payer: Medicare HMO | Admitting: Psychiatry

## 2022-07-20 DIAGNOSIS — F3341 Major depressive disorder, recurrent, in partial remission: Secondary | ICD-10-CM | POA: Diagnosis not present

## 2022-07-20 DIAGNOSIS — F5089 Other specified eating disorder: Secondary | ICD-10-CM

## 2022-07-20 DIAGNOSIS — F422 Mixed obsessional thoughts and acts: Secondary | ICD-10-CM | POA: Diagnosis not present

## 2022-07-20 DIAGNOSIS — F411 Generalized anxiety disorder: Secondary | ICD-10-CM | POA: Diagnosis not present

## 2022-07-20 DIAGNOSIS — Z630 Problems in relationship with spouse or partner: Secondary | ICD-10-CM

## 2022-07-20 DIAGNOSIS — R69 Illness, unspecified: Secondary | ICD-10-CM

## 2022-07-20 DIAGNOSIS — Z8659 Personal history of other mental and behavioral disorders: Secondary | ICD-10-CM

## 2022-07-20 DIAGNOSIS — Z9189 Other specified personal risk factors, not elsewhere classified: Secondary | ICD-10-CM

## 2022-07-20 DIAGNOSIS — Z636 Dependent relative needing care at home: Secondary | ICD-10-CM

## 2022-07-20 DIAGNOSIS — H579 Unspecified disorder of eye and adnexa: Secondary | ICD-10-CM

## 2022-07-20 DIAGNOSIS — A310 Pulmonary mycobacterial infection: Secondary | ICD-10-CM

## 2022-07-20 DIAGNOSIS — Z889 Allergy status to unspecified drugs, medicaments and biological substances status: Secondary | ICD-10-CM

## 2022-07-20 NOTE — Progress Notes (Signed)
Psychotherapy Progress Note [facility] [name], PhD LP  [name])    MRN: [id number] Therapy format: Individual psychotherapy Date: 07/20/2022      Start: 3:14p     Stop: 4:04p     Time Spent: 50 min Location: Telehealth visit -- I connected with this patient by an approved telecommunication method (video), with her informed consent, and verifying identity and patient privacy.  I was located at my office and patient at her home.  As needed, we discussed the limitations, risks, and security and privacy concerns associated with telehealth service, including the availability and conditions which currently govern in-person appointments and the possibility that 3rd-party payment may not be fully guaranteed and she may be responsible for charges.  After she indicated understanding, we proceeded with the session.  Also discussed treatment planning, as needed, including ongoing verbal agreement with the plan, the opportunity to ask and answer all questions, her demonstrated understanding of instructions, and her readiness to call the office should symptoms worsen or she feels she is in a crisis state and needs more immediate and tangible assistance.   Session narrative (presenting needs, interim history, self-report of stressors and symptoms, applications of prior therapy, status changes, and interventions made in session) Dealing with layered eye issues right now.  HSV controlled but having blepharitis and immune reactions she understands are related to allergy to dust mites that live around hair follicles.  Has ti tree oil wipes on order, recommended by her doctor, but meanwhile it is painful and involves frequent cleaning.  Brief recollection of prior therapy emphasizing rational processing and how it helped her learn to recognize her quick thoughts and evaluate her beliefs and presumptions.  Affirmed and encouraged.  Meanwhile, accidentally sent a  practice text to Nira Conn, her DIL, asking for photo of her grandchildren, despite the estrangement, noting how not seeing them is an unresolved issue for her and she'd like to learn more about them.  Nothing regrettable in it, and carefully written, actually, to acknowledge how it may be coming out of the blue for her, and granting her choice.  That said, figured if she doesn't hear back in 2 weeks, she'll block her and remove her number.  Removed son [name] number from her contact list already.  Probed why block, basically wants to constrain the possibility of getting another judgmental or patronizing Christmastime text from [place] or any surrogates.  Encouraged to see that if she can refrain and just let the channel remain open instead, she can trust herself to be durable enough to handle it if she does get an unwanted reply, no need to send a message or close a door by blocking.  Gets regularly reminded of her estrangement when friend Butch Penny talks about her kids, and the memory of the text she did get, and the time they spoke openly in front of her about when his grandmother would cash out, and when [place] came over to help with the baby and got treated like an intruder.  At this point, 4+ years since she's seen her granddaughter, [name], now 6.5, and she's never met her grandson [name].  Affirmed being clear about not wanting to rehash the [name] issue, and on fulfilling her values in how she wrote the message, and on being honestly more ready than she knows to accept either outcome.  Also affirmed the chances she gets to be a virtual grandmother by way of friend [name]'s grandson.  Briefly, reports food  anxiety achievements in buying bananas (notably, without worrying about the little guys climbing trees in their loincloths), and drinking a whole Boost instead of just test sips.  Affirmed and encouraged. In broadening and deepening her capacity to eat promptly and fully.  Therapeutic modalities:  Cognitive Behavioral Therapy and Solution-Oriented/Positive Psychology  Mental Status/Observations:  Appearance:   Casual     Behavior:  Appropriate  Motor:  Normal  Speech/Language:   Clear and Coherent  Affect:  Appropriate  Mood:  anxious and dysthymic  Thought process:  normal  Thought content:    Obsessions  Sensory/Perceptual disturbances:    WNL  Orientation:  Fully oriented  Attention:  Good    Concentration:  Fair  Memory:  WNL  Insight:    Good  Judgment:   Good  Impulse Control:  Good   Risk Assessment: Danger to Self: No Self-injurious Behavior: No Danger to Others: No Physical Aggression / Violence: No Duty to Warn: No Access to Firearms a concern: No  Assessment of progress:  progressing  Diagnosis:   ICD-10-CM   1. 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 OCD - contamination/illness type  F42.2     3. Orthorexia nervosa  F50.89     4. Caregiver stress  Z63.6     5. Relationship problem between partners  Z63.0     6. History of posttraumatic stress disorder (PTSD)  Z86.59     7. Recurrent major depressive disorder, in partial remission ([place])  F33.41     8. At risk for imbalanced nutrition  Z91.89     9. r/o Lupus (SLE) vs. unspecified complex AI disorder  R69     10. Mycobacterium avium complex ([place])  A31.0     11. 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  H57.9     12. Multiple allergies  Z88.9      Plan:  Fear of illness Reframe the moment she feels like avoiding food as her golden opportunity to say "yes" and to be in control every bit a much as saying "no" Continue exposures to perceived risk of contamination she knows is minuscule or well-controlled Reduce arbitrary wait time for food, increase bites taken as a "test" experiment with broader and deeper intake Use imaginary [place] as "coach" when facing anxiety  Take food-related dreams as a sign of restoring normal appetite Continue  self-help through [employer] ad lib Caregiver stress Seek adequate time away from home and perpetual care -- may be opportunities safe enough, where [name] is capable of managing some time alone Continue reframing [name]'s behavior sometimes as second childhood, less intentional, but differentiate it from raising [name] As long as history of unfaithfulness is not going to be addressed, seek to forgive as a form of unburdening resentment Pursue reasonable collaborations with his treatment team for cognitive health Look into respite care and facility and in-home care alternatives for [name] to get ahead on eventual developments Physical health Stretch liberally as able for orthopedic/spinal issues Observe reasonable standards for airborne infection control given her lung condition For sleep, continue to establish electronics curfew For weight maintenance, ensure good calorie density in food choices and  For health, ensure good variety, especially adequate vegetable and fruit nutrients, wheat OK Social support Work assertiveness with friends to get adequate time listened to  Continuing discretion disclosing to friends and asking support and her turn  If interested, connect with dementia support organization(s) Family strife  Be willing to tell [name] he gets in his own way with pestering, not just time him out Continue stoic approach to [name] resentment/rejection.  Refrain from drastic moves to block, jut trust strong enough to non-reply if aggressive. OK to keep touch with ex-H. Other recommendations/advice as may be noted above Continue to utilize previously learned skills ad lib Maintain medication as prescribed and work faithfully with relevant prescriber(s) if any changes are desired or seem indicated Call the clinic on-call service, 988/hotline, 911, or present to [facility] or ER if any life-threatening psychiatric crisis Return for as already scheduled, avail earlier @ PT's  need. Already scheduled visit in this office 08/03/2022.  [name], PhD [name], PhD LP Clinical Psychologist, [facility] [address], [place], [place] [address] [phone]

## 2022-07-25 ENCOUNTER — Encounter (INDEPENDENT_AMBULATORY_CARE_PROVIDER_SITE_OTHER): Payer: Self-pay

## 2022-08-03 ENCOUNTER — Ambulatory Visit (INDEPENDENT_AMBULATORY_CARE_PROVIDER_SITE_OTHER): Payer: Medicare HMO | Admitting: Psychiatry

## 2022-08-03 DIAGNOSIS — R69 Illness, unspecified: Secondary | ICD-10-CM

## 2022-08-03 DIAGNOSIS — Z8659 Personal history of other mental and behavioral disorders: Secondary | ICD-10-CM

## 2022-08-03 DIAGNOSIS — F5089 Other specified eating disorder: Secondary | ICD-10-CM

## 2022-08-03 DIAGNOSIS — A310 Pulmonary mycobacterial infection: Secondary | ICD-10-CM

## 2022-08-03 DIAGNOSIS — Z636 Dependent relative needing care at home: Secondary | ICD-10-CM

## 2022-08-03 DIAGNOSIS — F422 Mixed obsessional thoughts and acts: Secondary | ICD-10-CM

## 2022-08-03 DIAGNOSIS — F411 Generalized anxiety disorder: Secondary | ICD-10-CM | POA: Diagnosis not present

## 2022-08-03 DIAGNOSIS — G4721 Circadian rhythm sleep disorder, delayed sleep phase type: Secondary | ICD-10-CM

## 2022-08-03 DIAGNOSIS — Z62820 Parent-biological child conflict: Secondary | ICD-10-CM

## 2022-08-03 DIAGNOSIS — Z9189 Other specified personal risk factors, not elsewhere classified: Secondary | ICD-10-CM

## 2022-08-03 DIAGNOSIS — Z630 Problems in relationship with spouse or partner: Secondary | ICD-10-CM

## 2022-08-03 DIAGNOSIS — F3341 Major depressive disorder, recurrent, in partial remission: Secondary | ICD-10-CM
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
## 2022-08-12 ENCOUNTER — Ambulatory Visit (INDEPENDENT_AMBULATORY_CARE_PROVIDER_SITE_OTHER): Payer: Medicare HMO | Admitting: Psychiatry

## 2022-08-12 DIAGNOSIS — Z630 Problems in relationship with spouse or partner: Secondary | ICD-10-CM

## 2022-08-12 DIAGNOSIS — Z8659 Personal history of other mental and behavioral disorders: Secondary | ICD-10-CM

## 2022-08-12 DIAGNOSIS — Z9189 Other specified personal risk factors, not elsewhere classified: Secondary | ICD-10-CM

## 2022-08-12 DIAGNOSIS — Z889 Allergy status to unspecified drugs, medicaments and biological substances status: Secondary | ICD-10-CM

## 2022-08-12 DIAGNOSIS — I499 Cardiac arrhythmia, unspecified: Secondary | ICD-10-CM

## 2022-08-12 DIAGNOSIS — F3341 Major depressive disorder, recurrent, in partial remission: Secondary | ICD-10-CM | POA: Diagnosis not present

## 2022-08-12 DIAGNOSIS — A310 Pulmonary mycobacterial infection: Secondary | ICD-10-CM

## 2022-08-12 DIAGNOSIS — F5089 Other specified eating disorder: Secondary | ICD-10-CM | POA: Diagnosis not present

## 2022-08-12 DIAGNOSIS — G4721 Circadian rhythm sleep disorder, delayed sleep phase type: Secondary | ICD-10-CM

## 2022-08-12 DIAGNOSIS — F411 Generalized anxiety disorder: Secondary | ICD-10-CM

## 2022-08-12 DIAGNOSIS — F422 Mixed obsessional thoughts and acts: Secondary | ICD-10-CM | POA: Diagnosis not present

## 2022-08-12 DIAGNOSIS — Z636 Dependent relative needing care at home: Secondary | ICD-10-CM

## 2022-08-12 DIAGNOSIS — R69 Illness, unspecified: Secondary | ICD-10-CM

## 2022-08-12 NOTE — Progress Notes (Signed)
Psychotherapy Progress Note [facility] [name], PhD LP  [name])    MRN: [id number] Therapy format: Individual psychotherapy Date: 08/12/2022      Start: 3:16p     Stop: 4:06p     Time Spent: 50 min Location: Telehealth visit -- I connected with this patient by an approved telecommunication method (video), with her informed consent, and verifying identity and patient privacy.  I was located at my office and patient at her home.  As needed, we discussed the limitations, risks, and security and privacy concerns associated with telehealth service, including the availability and conditions which currently govern in-person appointments and the possibility that 3rd-party payment may not be fully guaranteed and she may be responsible for charges.  After she indicated understanding, we proceeded with the session.  Also discussed treatment planning, as needed, including ongoing verbal agreement with the plan, the opportunity to ask and answer all questions, her demonstrated understanding of instructions, and her readiness to call the office should symptoms worsen or she feels she is in a crisis state and needs more immediate and tangible assistance.   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  Finding herself much appreciated by the chapter regent, and the work meaningful.  Considering serving as historian after the rush is over, sees how some things she's been up to make her suited, and sees a strong bond developing with fellow leaders while picking up the pieces.  Did have to tell on her friend [name] for an indiscreet social media post overexposing the [name] to controversy without permission.  Increased worry for [name]  over her anger, codependent behavior with daughter, and susceptibility to getting irritated, and for her daughter, who messages separately about feeling she can do nothing right by her mother.  Sees her deteriorating some in the last 3 years, facing chronic pain, longterm opioid use, pandemic, chronically lonely marriage, worry for her daughter, flying into action to help take care of her grandson, and being on the receiving end of social retaliation.  Sees her flying hot with, and about, Lauren, and starting to speak negatively about both Lauren and [place].  Sees her barreling toward a nervous breakdown, or crumbling into depression.  Knows she has indiscreetly used Gredmarie's story to help her case for grievances.  See [name] getting addicted to he grandson, actually, and at risk for alienating the mother, actually.  Encouraged be willing to bring it up, in her usual care and concern, and ready to let her know what behavior worries her.    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  Does acknowledge resenting the hearing loss, on top of other hardships.  Support/empathy provided.   With [name], she's seeing more difficulty with executive function and avoiding conversations, but handling it better.  Overall depression better seeing herself using her own mind and being useful, not dwelling as she had on loss/hardship.  Therapeutic modalities: Cognitive Behavioral Therapy, Solution-Oriented/Positive Psychology, and Ego-Supportive  Mental Status/Observations:  Appearance:   Casual  Behavior:  Appropriate  Motor:  Normal  Speech/Language:   Clear and Coherent  Affect:  Appropriate  Mood:  dysthymic  Thought process:  normal   Thought content:    WNL  Sensory/Perceptual disturbances:    WNL  Orientation:  Fully oriented  Attention:  Good    Concentration:  Fair  Memory:  WNL  Insight:    Good  Judgment:   Good  Impulse Control:  Good   Risk Assessment: Danger to Self: No Self-injurious Behavior: No Danger to Others: No Physical Aggression / Violence: No Duty to Warn: No Access to Firearms a concern: No  Assessment of progress:  stabilized  Diagnosis:   ICD-10-CM   1. 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 OCD - contamination/illness type  F42.2     3. Orthorexia nervosa  F50.89     4. Caregiver stress  Z63.6     5. Relationship problem between partners  Z63.0     6. History of posttraumatic stress disorder (PTSD)  Z86.59     7. Recurrent major depressive disorder, in partial remission ([place])  F33.41     8. At risk for imbalanced nutrition  Z91.89     9. Mycobacterium avium complex ([place])  A31.0     10. 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     11. Multiple allergies  Z88.9     12. r/o Lupus (SLE) vs. unspecified complex AI disorder  R69     13. Cardiac arrhythmia, unspecified cardiac arrhythmia type  I49.9     14. r/o CVA, enduring sensorineural hearing loss vs. temporary inflammatory condition  R69      Plan:  Fear of illness Reframe the moment she feels like avoiding food as her golden opportunity to say "yes" and to be in control every bit a much as saying "no" Continue exposures to perceived risk of contamination she knows is minuscule or well-controlled Reduce arbitrary wait time for food, increase bites taken as a "test" experiment with broader and deeper intake Use imaginary [place] as "coach" when facing anxiety  Take food-related dreams as a sign of restoring normal appetite Continue self-help through [employer] ad lib Physical health Evaluate as needed hearing loss Stretch liberally as able for orthopedic/spinal  issues Observe reasonable standards for airborne infection control given her lung condition For sleep, continue to establish electronics curfew For weight maintenance, ensure good calorie density in food choices and sufficient quantity For health, ensure good variety, especially adequate vegetable and fruit nutrients, wheat OK Caregiver stress Seek adequate time away from home and perpetual care -- may be opportunities safe enough, where [name] is capable of managing some time alone Continue reframing [name]'s behavior sometimes as second childhood, less intentional, but differentiate it from raising [name] Pursue reasonable collaborations with his treatment team for cognitive health Look into respite care and facility and in-home care alternatives for [name] to get ahead on eventual developments If interested, connect with dementia support organization(s) As long as history of unfaithfulness is not going to be addressed, seek to forgive as a form of unburdening resentment Social support Work assertiveness with friends to get adequate time listened to  Continuing discretion disclosing to friends and asking support and her turn  Manage work/service and limits with [name], other organizations.   Assert as needed with friend [name], OK to be direct, keep it about behavior and its better/worse effects Family strife Be willing to  tell [name] he gets in his own way with pestering, not just time him out Continue stoic approach to [name] resentment/rejection.  Refrain from drastic moves to block, jut trust strong enough to non-reply if aggressive. Other recommendations/advice as may be noted above Continue to utilize previously learned skills ad lib Maintain medication as prescribed and work faithfully with relevant prescriber(s) if any changes are desired or seem indicated Call the clinic on-call service, 988/hotline, 911, or present to [facility] or ER if any life-threatening psychiatric crisis Return  for as already scheduled. Already scheduled visit in this office 08/22/2022.  [name], PhD [name], PhD LP Clinical Psychologist, [facility] [address] [place],  [address] [phone]

## 2022-08-22 ENCOUNTER — Ambulatory Visit: Payer: Medicare HMO | Admitting: Psychiatry

## 2022-08-22 DIAGNOSIS — Z91199 Patient's noncompliance with other medical treatment and regimen due to unspecified reason: Secondary | ICD-10-CM

## 2022-08-22 NOTE — Progress Notes (Signed)
Admin note for non-service contact  [name]  MRN: [id number] DATE: 08/22/2022  Logged on 4:20p, PT not present, links sent to text and email, went unanswered the rest of the hour.  No charge, RS as able.  Sandra Fries, PhD Sandra [name], PhD LP Clinical Psychologist, [facility] [address], [address] [place], [place] [address] [phone]

## 2022-09-27 ENCOUNTER — Encounter: Payer: Self-pay | Admitting: Physician Assistant

## 2022-09-27 ENCOUNTER — Telehealth (INDEPENDENT_AMBULATORY_CARE_PROVIDER_SITE_OTHER): Payer: Medicare HMO | Admitting: Physician Assistant

## 2022-09-27 DIAGNOSIS — F422 Mixed obsessional thoughts and acts: Secondary | ICD-10-CM | POA: Diagnosis not present

## 2022-09-27 DIAGNOSIS — Z636 Dependent relative needing care at home: Secondary | ICD-10-CM | POA: Diagnosis not present

## 2022-09-27 DIAGNOSIS — F411 Generalized anxiety disorder: Secondary | ICD-10-CM | POA: Diagnosis not present

## 2022-09-27 MED ORDER — ALPRAZOLAM 0.5 MG PO TABS
ORAL_TABLET | ORAL | 5 refills | Status: DC
Start: 2022-09-27 — End: 2023-03-28

## 2022-09-27 NOTE — Progress Notes (Signed)
[facility]  [name],  MRN: [id number]  PCP: Barbette Reichmann, MD  Date of Evaluation: 09/27/2022 Time spent:20 minutes  Chief Complaint:  Chief Complaint   Anxiety; Follow-up    Virtual Visit via Telehealth  I connected with patient by a video enabled telemedicine application with their informed consent, and verified patient privacy and that I am speaking with the correct person using two identifiers.  I am private, in my office and the patient is at home.  I discussed the limitations, risks, security and privacy concerns of performing an evaluation and management service by telephone video and the availability of in person appointments. I also discussed with the patient that there may be a patient responsible charge related to this service. The patient expressed understanding and agreed to proceed.   I discussed the assessment and treatment plan with the patient. The patient was provided an opportunity to ask questions and all were answered. The patient agreed with the plan and demonstrated an understanding of the instructions.   The patient was advised to call back or seek an in-person evaluation if the symptoms worsen or if the condition fails to improve as anticipated.  I provided 20  minutes of non-face-to-face time during this encounter.  HISTORY/CURRENT STATUS: For routine 6 month med check.  Anxiety is well-controlled with xanax. Occasionally she doesn't need the afternoon dose. 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  It is worsening.  States he does have good days though. But fewer and farther between than they used to be. She's not having PA, just gets overwhelmed easily. Still wants to wean off the Xanax but it's never a good time.  She does still obsess about germs.  Not as bad as it used to be when COVID first hip.  She is very careful and still hesitant to go outside of the home if she does not have to.  She has  tried multiple medications to prevent the anxiety and has reactions to them that are intolerable. She has had a few days worth of Xanax missing in the past 6 months. She gets it filled regularly and has started counting the number of pills she gets immediately after she gets home from the pharmacy and the pill count is always correct.  She wanted to let me know this because there is a question of whether her husband might have taken a few pills, thinking they were he is.  She talked with the social worker at [facility] who supports her with the husband's dementia.  She recommended locking up the Xanax, Deashia did that a few weeks ago.  She states he would never have done anything like that but with dementia you never know.  Even with Christmas, she did not go into a severe depression.  She is able to enjoy things, crafts and writing.  Energy and motivation are pretty good.  No extreme sadness, tearfulness, or feelings of hopelessness.  Sleeps well most of the time. ADLs and personal hygiene are normal.   Denies any changes in concentration, making decisions, or remembering things.  Appetite has not changed.  Weight is stable.   Denies suicidal or homicidal thoughts.  Patient denies increased energy with decreased need for sleep, increased talkativeness, racing thoughts, impulsivity or risky behaviors, increased spending, increased libido, grandiosity, increased irritability or anger, paranoia, or hallucinations.  Denies dizziness, syncope, seizures, numbness, tingling, tremor, tics, unsteady gait, slurred speech, confusion. Denies muscle or joint pain, stiffness, or  dystonia.  Individual Medical History/ Review of Systems: Changes? :No      Past medications for mental health diagnoses include: Zoloft, caused psychosis w/ hallucinations,  BuSpar, Ativan, Effexor (became psychotic) She was told to never take any kind of SSRI/SNRI again. Wellbutrin caused agitation.  Allergies: Albuterol, Cephalosporins, Flagyl  [metronidazole], Macrobid [nitrofurantoin macrocrystal], Neomy-bacit-polymyx-pramoxine, Penicillins, Sulfa antibiotics, Vancomycin, Vicodin [hydrocodone-acetaminophen], Amoxicillin, Ciprofloxacin, Latex, Bacitracin, Bacitracin-neomycin-polymyxin, Barium-containing compounds, Nitrofurantoin, Tobramycin, Decongest-aid [pseudoephedrine], Eggshell membrane (chicken) [egg shells], Hydrocodone-acetaminophen, Levofloxacin, Other, Prednisone, and Zithromax [azithromycin]  Current Medications:  Current Outpatient Medications:    acetaminophen (TYLENOL) 500 MG tablet, Take 500 mg by mouth every 6 (six) hours as needed., Disp: , Rfl:    Artificial Tear Ointment (REFRESH P.M. OP), Place 1 application into both eyes at bedtime. Apply eye gel to both eyelids, Disp: , Rfl:    atenolol (TENORMIN) 25 MG tablet, Take 12.5 mg by mouth 2 (two) times daily. , Disp: , Rfl:    budesonide (PULMICORT) 180 MCG/ACT inhaler, Inhale 2 puffs into the lungs 2 (two) times daily. , Disp: , Rfl:    dicyclomine (BENTYL) 20 MG tablet, Take 1 tablet (20 mg total) by mouth 2 (two) times daily., Disp: 20 tablet, Rfl: 0   fluticasone (FLONASE) 50 MCG/ACT nasal spray, Place 1 spray into both nostrils daily as needed for allergies., Disp: , Rfl: 6   ipratropium (ATROVENT HFA) 17 MCG/ACT inhaler, Inhale 2 puffs into the lungs 4 (four) times daily., Disp: , Rfl:    levalbuterol (XOPENEX HFA) 45 MCG/ACT inhaler, Inhale 2 puffs into the lungs every 4 (four) hours as needed for wheezing., Disp: , Rfl:    Multiple Vitamin (MULTIVITAMIN WITH MINERALS) TABS, Take 1 tablet by mouth daily., Disp: , Rfl:    mupirocin ointment (BACTROBAN) 2 %, APPLY TO AFFECTED AREA 3 TIMES A DAY, Disp: , Rfl:    pantoprazole (PROTONIX) 40 MG tablet, Take 40 mg by mouth daily as needed (GERD). For stomach, Disp: , Rfl:    Polyvinyl Alcohol-Povidone (REFRESH OP), Place 1 drop into both eyes daily as needed (dry eyes)., Disp: , Rfl:    ZIRGAN 0.15 % GEL, APPLY 1 A SMALL  AMOUNT IN AFFECTED EYE 5 TIMES A DAY UNTIL DIRECTED, Disp: , Rfl:    ALPRAZolam (XANAX) 0.5 MG tablet, TAKE 1.5 PILLS IN MORNING, 1.5 A PILLS AT LUNCH, 1 AT DINNER, AND 1.5 PILLS AT BEDTIME, ALL AS NEEDED, Disp: 165 tablet, Rfl: 5   aspirin 81 MG chewable tablet, Chew 81 mg by mouth daily. (Patient not taking: Reported on 04/14/2022), Disp: , Rfl:    Cholecalciferol (VITAMIN D3) 10 MCG (400 UNIT) tablet, Take by mouth. (Patient not taking: Reported on 04/14/2022), Disp: , Rfl:    COVID-19 mRNA bivalent vaccine, Pfizer, (PFIZER COVID-19 VAC BIVALENT) injection, Inject into the muscle. (Patient not taking: Reported on 04/26/2022), Disp: 0.3 mL, Rfl: 0   COVID-19 mRNA vaccine 2023-2024 (COMIRNATY) SUSP injection, Inject into the muscle., Disp: 0.3 mL, Rfl: 0   gabapentin (NEURONTIN) 100 MG capsule, Take 100 mg by mouth at bedtime as needed. (Patient not taking: Reported on 04/14/2022), Disp: , Rfl:  Medication Side Effects: none  Family Medical/ Social History: Changes? No  MENTAL HEALTH EXAM:  There were no vitals taken for this visit.There is no height or weight on file to calculate BMI.  General Appearance: Casual and Well Groomed  Eye Contact:  Good  Speech:  Clear and Coherent, Normal Rate, and Talkative  Volume:  Normal  Mood:  Euthymic  Affect:  Congruent  Thought Process:  Goal Directed and Descriptions of Associations: Intact  Orientation:  Full (Time, Place, and Person)  Thought Content: Logical   Suicidal Thoughts:  No  Homicidal Thoughts:  No  Memory:  WNL  Judgement:  Good   Insight:  Good  Psychomotor Activity:  Normal  Concentration:  Concentration: Good and Attention Span: Good  Recall:  Good  Fund of Knowledge: Good  Language: Good  Assets:  Desire for Improvement Financial Resources/Insurance Housing Transportation  ADL's:  Intact  Cognition: WNL  Prognosis:  Good    DIAGNOSES:    ICD-10-CM   1. Generalized anxiety disorder with hx panic & agoraphobia  F41.1      2. Caregiver stress  Z63.6     3. OCD - contamination/illness type  F42.2       Receiving Psychotherapy: Yes w/ Dr. [name]   RECOMMENDATIONS:  PDMP reviewed.  Last Xanax filled 09/08/2022. I provided 20 minutes of non-face-to-face time during this encounter, including time spent before and after the visit in records review, medical decision making, counseling pertinent to today's visit, and charting.   We discussed her locking up the Xanax.  I think that is a good idea under the circumstances.  Her husband does not know what he is doing all the time and it would be dangerous for him to take the Xanax. She is responding well so no change will be made.  Continue Xanax 0.5 mg, 1.5 pills every morning, 1.5 pill at lunch, 1 pill at dinner, 1.5 at bedtime as needed.   Continue all supplements. Continue psychotherapy with Dr. [name].  Return in 6 months.  Donnal Moat, PA-C

## 2022-10-03 ENCOUNTER — Other Ambulatory Visit: Payer: Self-pay

## 2022-10-05 ENCOUNTER — Other Ambulatory Visit: Payer: Self-pay

## 2022-10-12 ENCOUNTER — Other Ambulatory Visit: Payer: Self-pay

## 2022-10-20 ENCOUNTER — Ambulatory Visit (INDEPENDENT_AMBULATORY_CARE_PROVIDER_SITE_OTHER): Payer: Medicare HMO | Admitting: Psychiatry

## 2022-10-20 DIAGNOSIS — F411 Generalized anxiety disorder: Secondary | ICD-10-CM | POA: Diagnosis not present

## 2022-10-20 DIAGNOSIS — Z8659 Personal history of other mental and behavioral disorders: Secondary | ICD-10-CM | POA: Diagnosis not present

## 2022-10-20 DIAGNOSIS — F3341 Major depressive disorder, recurrent, in partial remission: Secondary | ICD-10-CM | POA: Diagnosis not present

## 2022-10-20 DIAGNOSIS — Z636 Dependent relative needing care at home: Secondary | ICD-10-CM
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
## 2022-11-03 ENCOUNTER — Telehealth: Payer: Self-pay | Admitting: Psychiatry

## 2022-11-03 ENCOUNTER — Ambulatory Visit: Payer: Medicare HMO | Admitting: Psychiatry
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
## 2022-11-17 ENCOUNTER — Ambulatory Visit: Payer: Medicare HMO | Admitting: Psychiatry

## 2022-11-24 ENCOUNTER — Ambulatory Visit: Payer: Medicare HMO | Admitting: Psychiatry

## 2022-11-24 DIAGNOSIS — F422 Mixed obsessional thoughts and acts: Secondary | ICD-10-CM

## 2022-11-24 DIAGNOSIS — Z636 Dependent relative needing care at home: Secondary | ICD-10-CM

## 2022-11-24 DIAGNOSIS — Z630 Problems in relationship with spouse or partner: Secondary | ICD-10-CM

## 2022-11-24 DIAGNOSIS — F411 Generalized anxiety disorder: Secondary | ICD-10-CM | POA: Diagnosis not present

## 2022-11-24 DIAGNOSIS — F5089 Other specified eating disorder: Secondary | ICD-10-CM | POA: Diagnosis not present

## 2022-11-24 DIAGNOSIS — Z8659 Personal history of other mental and behavioral disorders: Secondary | ICD-10-CM

## 2022-11-24 DIAGNOSIS — F3341 Major depressive disorder, recurrent, in partial remission: Secondary | ICD-10-CM | POA: Diagnosis not present
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
## 2022-12-22 ENCOUNTER — Ambulatory Visit (INDEPENDENT_AMBULATORY_CARE_PROVIDER_SITE_OTHER): Payer: Medicare HMO | Admitting: Psychiatry

## 2022-12-22 DIAGNOSIS — F3341 Major depressive disorder, recurrent, in partial remission: Secondary | ICD-10-CM

## 2022-12-22 DIAGNOSIS — Z8659 Personal history of other mental and behavioral disorders: Secondary | ICD-10-CM

## 2022-12-22 DIAGNOSIS — F5089 Other specified eating disorder: Secondary | ICD-10-CM

## 2022-12-22 DIAGNOSIS — F422 Mixed obsessional thoughts and acts: Secondary | ICD-10-CM

## 2022-12-22 DIAGNOSIS — F411 Generalized anxiety disorder: Secondary | ICD-10-CM

## 2022-12-22 DIAGNOSIS — A310 Pulmonary mycobacterial infection: Secondary | ICD-10-CM

## 2022-12-22 DIAGNOSIS — Z636 Dependent relative needing care at home: Secondary | ICD-10-CM

## 2022-12-22 NOTE — Progress Notes (Signed)
Psychotherapy Progress Note [facility] [name], PhD LP  [name])    MRN: [id number] Therapy format: Individual psychotherapy Date: 12/22/2022      Start: 10:18a     Stop: 11:18a     Time Spent: 60 min Location: Telehealth visit -- I connected with this patient by an approved telecommunication method (video), with her informed consent, and verifying identity and patient privacy.  I was located at my office and patient at her home.  As needed, we discussed the limitations, risks, and security and privacy concerns associated with telehealth service, including the availability and conditions which currently govern in-person appointments and the possibility that 3rd-party payment may not be fully guaranteed and she may be responsible for charges.  After she indicated understanding, we proceeded with the session.  Also discussed treatment planning, as needed, including ongoing verbal agreement with the plan, the opportunity to ask and answer all questions, her demonstrated understanding of instructions, and her readiness to call the office should symptoms worsen or she feels she is in a crisis state and needs more immediate and tangible assistance.   Session narrative (presenting needs, interim history, self-report of stressors and symptoms, applications of prior therapy, status changes, and interventions made in session) Tedious task under way organizing a large family reunion, with 175 addresses to type into email.  Prior family organizer had a stroke last year.  Interesting genealogy find that she is distantly related to the infamous Bonita Quin, the famous Rachael Darby, and a noted trapeze artist from the old days of [employer]    Discussed financial management in the face of some uncertainty in billing and cost changes.  Agreed it's prudent to preposition funds if she knows a bill is coming on delayed basis.  Offered the option, for  therapy's sake, to consider fluctuations in billing as a special project -- attacking unhealthy demands for perfectionistic order in her world, and becoming more flexible in return.    Finds herself wanting [name] to stay in bed longer so she can have more free time in the mornings.  Thinks she may be "hiding from depression" with this, but assured it's natural for anyone who's stressed.    Getting annoyed with a cousin who monopolizes conversations.  Some background worry about COVID risk with her lung condition (esp as some very desirable gatherings are coming up that entail mixing air with many people).    On the anxiety front, has been opening the door more readily to people, and sticking with her restorative eating plan (more adequate portions, consuming 3 things, not 1, at a time).  Found herself able to throw herself into going to the store when she had an oral health need.  Practicing "Am I alive, or am I living?" as a mantra, partly for knowing she has a lung condition with 50% mortality.  Continues to have more awareness of hunger, even dreaming of food, and awareness of feeling disconnectedness from others, and having richer, more varied memories from childhood, for having shared some of her uglier memories.  Finding herself more dispassionate about B [name] being depressed of late.  Reframed as a sign of life, getting healthier, and more sober, not the callousness she might fear.    Therapeutic modalities: Cognitive Behavioral Therapy, Solution-Oriented/Positive Psychology, and Ego-Supportive  Mental Status/Observations:  Appearance:   Casual     Behavior:  Appropriate  Motor:  Normal  Speech/Language:   Clear and Coherent  Affect:  Appropriate  Mood:  dysthymic  Thought process:  normal  Thought content:    WNL  Sensory/Perceptual disturbances:    WNL  Orientation:  Fully oriented  Attention:  Good    Concentration:  Good  Memory:  WNL  Insight:    Good  Judgment:   Good   Impulse Control:  Good   Risk Assessment: Danger to Self: No Self-injurious Behavior: No Danger to Others: No Physical Aggression / Violence: No Duty to Warn: No Access to Firearms a concern: No  Assessment of progress:  progressing  Diagnosis:   ICD-10-CM   1. Recurrent major depressive disorder, in partial remission (HCC)  F33.41     2. Orthorexia nervosa  F50.89     3. History of posttraumatic stress disorder (PTSD)  Z86.59     4. 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 Caregiver stress  Z63.6     6. 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 Mycobacterium avium complex (HCC)  A31.0      Plan:  Regrets/trauma hx OK to continue reminiscing, helpful to reach the good with the bad Remember, contextualize, forgive ad lib -- [name], parents, and herself Always may, if needed, confront [name] again, if just with the fact that it's abrasive to hear him complain at her about anything when this is the history Fear of foodborne illness Reframe the moment she feels like avoiding food as her golden opportunity to say "yes" and to be in control every bit a much as saying "no" Continue exposures to perceived risk of contamination she knows is minuscule or well-controlled Reduce arbitrary wait time for food, increase bites taken as a "test" experiment with broader and deeper intake Use imaginary [place] as "coach" when facing anxiety  Take food-related dreams as a sign of restoring normal appetite Continue self-help through [employer] ad lib Physical health Evaluate as needed hearing loss Stretch liberally as able for orthopedic/spinal issues Observe reasonable standards for airborne infection control given her lung condition For sleep, continue to establish electronics curfew and willingness to turn in For weight maintenance, ensure good calorie density in food choices and sufficient quantity For health, ensure good variety, especially adequate  vegetable and fruit nutrients, wheat OK Caregiver stress Continue reframing [name]'s behavior sometimes as second childhood -- not the same as re-raising [name] or revisiting her put-upon childhood, but it does mean an imposed parenting role until it transitions to professional care Pursue reasonable collaborations with his treatment team for cognitive health Seek adequate time away from home and perpetual care -- may be opportunities safe enough, where [name] is capable of managing some time alone.  Look into respite care and facility and in-home care alternatives for [name] to get ahead on eventual developments. If interested, connect with dementia support organization(s) As long as his history of infidelity is not worth addressing, then see forgiveness as merely a way to unburden herself of resentment Social support Work assertiveness with friends to get adequate time listened to  Continuing discretion disclosing to friends and asking support and her turn  Manage work/service and limits with [name], other organizations.   Assert as needed with friend [name], OK to be direct, keep it about behavior and its better/worse effects Family strife Continue willing to assert with [name] about behaviors that get in his own way  Continue stoic approach to [name] resentment/rejection.  Refrain from drastic moves to block, jut trust strong enough to  non-reply if aggressive. Other recommendations/advice as may be noted above Continue to utilize previously learned skills ad lib Maintain medication as prescribed and work faithfully with relevant prescriber(s) if any changes are desired or seem indicated Call the clinic on-call service, 988/hotline, 911, or present to [facility] or ER if any life-threatening psychiatric crisis No follow-ups on file. Already scheduled visit in this office 01/06/2023.  [name] Fries, PhD [name], PhD LP Clinical Psychologist, [facility] [address], [address] [place], [place] [address] [phone]

## 2023-01-06 ENCOUNTER — Ambulatory Visit (INDEPENDENT_AMBULATORY_CARE_PROVIDER_SITE_OTHER): Payer: Medicare HMO | Admitting: Psychiatry

## 2023-01-06 DIAGNOSIS — Z636 Dependent relative needing care at home: Secondary | ICD-10-CM

## 2023-01-06 DIAGNOSIS — F411 Generalized anxiety disorder: Secondary | ICD-10-CM

## 2023-01-06 DIAGNOSIS — F5089 Other specified eating disorder: Secondary | ICD-10-CM

## 2023-01-06 DIAGNOSIS — A310 Pulmonary mycobacterial infection: Secondary | ICD-10-CM

## 2023-01-06 DIAGNOSIS — Z8659 Personal history of other mental and behavioral disorders: Secondary | ICD-10-CM

## 2023-01-06 DIAGNOSIS — Z889 Allergy status to unspecified drugs, medicaments and biological substances status: Secondary | ICD-10-CM

## 2023-01-06 DIAGNOSIS — F3341 Major depressive disorder, recurrent, in partial remission: Secondary | ICD-10-CM

## 2023-01-06 NOTE — Progress Notes (Signed)
Psychotherapy Progress Note [facility] Sandra [name], PhD LP  [name])    MRN: [id number] Therapy format: Individual psychotherapy Date: 01/06/2023      Start: 4:19p     Stop: 5:10p     Time Spent: 51 min Location: Telehealth visit -- I connected with this patient by an approved telecommunication method (video), with her informed consent, and verifying identity and patient privacy.  I was located at my office and patient at her home.  As needed, we discussed the limitations, risks, and security and privacy concerns associated with telehealth service, including the availability and conditions which currently govern in-person appointments and the possibility that 3rd-party payment may not be fully guaranteed and she may be responsible for charges.  After she indicated understanding, we proceeded with the session.  Also discussed treatment planning, as needed, including ongoing verbal agreement with the plan, the opportunity to ask and answer all questions, her demonstrated understanding of instructions, and her readiness to call the office should symptoms worsen or she feels she is in a crisis state and needs more immediate and tangible assistance.   Session narrative (presenting needs, interim history, self-report of stressors and symptoms, applications of prior therapy, status changes, and interventions made in session) Even though she doesn't get out that often, allergies still act up with pollen that blows in opening the door for deliveries.  Annoyed with the storm door configuration, seems to create a wind tunnel problem, but expensive to change, and tied up with HOA requirements.  Been bothered lately also with probably well-meaning friends who try to make suggestions what she could do about her health restrictions (prominently, Sandra Burns), one issue with [name] and getting signed up to go to a conference when she only said she'd "like to" go.  The  masking she would need for protecting herself (in 700-person meetings, in tight quarters, for long times) would be pretty obtrusive and stand out in an ugly way.  Now figuring her MAC lung disease requires a lot of calorie burning, and therefore extra calories in, est 3000 cal/D and it's why she has such a hard time regaining weight despite sedentary lifestyle.  In explaining her aversion to exposure, included medical hx of severe scoliosis and extensive work on severe scoliosis, 14 months in a body cast in middle childhood, and difficulty fitting clothes, all in ways that meant uncomfortable attention.  Dealt in part with how she responded to lobbying, and even a little guilting, from Sandra Burns and her daughter, affirming how she explained to Sandra Burns how a half-thought idea of hers to mask would not be enough.    Validation provided for declining what friends want her to do, reinforced the notion that they are well-meaningly shortsighted, and that it is OK to be both more careful and less gregarious.  Notable worry for Sandra Burns, who is likely to return to Sandra Burns.  Discussed schedule -- OK with current scheduling.    Therapeutic modalities: Cognitive Behavioral Therapy, Solution-Oriented/Positive Psychology, and Ego-Supportive  Mental Status/Observations:  Appearance:   Casual     Behavior:  Appropriate  Motor:  Normal  Speech/Language:   Clear and Coherent  Affect:  Appropriate  Mood:  dysthymic  Thought process:  normal  Thought content:    WNL  Sensory/Perceptual disturbances:    WNL  Orientation:  Fully oriented  Attention:  Good    Concentration:  Fair  Memory:  WNL  Insight:    Good  Judgment:  Good  Impulse Control:  Good   Risk Assessment: Danger to Self: No Self-injurious Behavior: No Danger to Others: No Physical Aggression / Violence: No Duty to Warn: No Access to Firearms a concern: No  Assessment of progress:  progressing  Diagnosis:   ICD-10-CM   1. Recurrent major  depressive disorder, in partial remission (HCC)  F33.41     2. 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 History of posttraumatic stress disorder (PTSD)  Z86.59     4. Orthorexia nervosa  F50.89     5. Multiple allergies, with complex chronic pulmonary disease, and multiple immune illnesses  Z88.9     6. Mycobacterium avium complex (HCC)  A31.0     7. Caregiver stress  Z63.6      Plan:  Regrets/trauma hx OK to continue reminiscing, helpful to reach the good with the bad Remember, contextualize, forgive ad lib -- Sandra Burns, parents, and herself Always may, if needed, confront Sandra Burns again, if just with the fact that it's abrasive to hear him complain at her about anything when this is the history Fear of foodborne illness Reframe the moment she feels like avoiding food as her golden opportunity to say "yes" and to be in control every bit a much as saying "no" Continue exposures to perceived risk of contamination she knows is minuscule or well-controlled Reduce arbitrary wait time for food, increase bites taken as a "test" experiment with broader and deeper intake Use imaginary [place] as "coach" when facing anxiety  Take food-related dreams as a sign of restoring normal appetite Continue self-help through [employer] ad lib Physical health Evaluate as needed hearing loss Stretch liberally as able for orthopedic/spinal issues Observe reasonable standards for airborne infection control given her lung condition For sleep, continue to establish electronics curfew and willingness to turn in For weight maintenance, ensure good calorie density in food choices and sufficient quantity For health, ensure good variety, especially adequate vegetable and fruit nutrients, wheat OK Caregiver stress Continue reframing Sandra Burns's behavior sometimes as second childhood -- not the same as re-raising Sandra Burns or revisiting her put-upon childhood, but it does mean an imposed  parenting role until it transitions to professional care Pursue reasonable collaborations with his treatment team for cognitive health Seek adequate time away from home and perpetual care -- may be opportunities safe enough, where [name] is capable of managing some time alone.  Look into respite care and facility and in-home care alternatives for [name] to get ahead on eventual developments. If interested, connect with dementia support organization(s) As long as his history of infidelity is not worth addressing, then see forgiveness as merely a way to unburden herself of resentment Social support Work assertiveness with friends to get adequate time listened to  Continuing discretion disclosing to friends and asking support and her turn  Manage work/service and limits with [name], other organizations.   Assert as needed with friend Sandra Burns, OK to be direct, keep it about behavior and its better/worse effects Family strife Continue willing to assert with Sandra Burns about behaviors that get in his own way  Continue stoic approach to Sandra Burns's resentment/rejection.  Refrain from drastic moves to block, jut trust strong enough to non-reply if aggressive. Other recommendations/advice as may be noted above Continue to utilize previously learned skills ad lib Maintain medication as prescribed and work faithfully with relevant prescriber(s) if any changes are desired or seem indicated Call the clinic on-call service, 988/hotline, 911, or present to  [facility] or ER if any life-threatening psychiatric crisis No follow-ups on file. Already scheduled visit in this office 02/27/2023.  [name] Fries, PhD Sandra [name], PhD LP Clinical Psychologist, [facility] [address] [place], [place] [address] [phone]

## 2023-01-25 ENCOUNTER — Other Ambulatory Visit: Payer: Self-pay | Admitting: Internal Medicine

## 2023-01-25 DIAGNOSIS — Z1231 Encounter for screening mammogram for malignant neoplasm of breast: Secondary | ICD-10-CM

## 2023-02-21 ENCOUNTER — Ambulatory Visit
Admission: RE | Admit: 2023-02-21 | Discharge: 2023-02-21 | Disposition: A | Discharge status: Home / Self Care | Payer: Medicare HMO | Source: Ambulatory Visit | Attending: Internal Medicine | Admitting: Internal Medicine

## 2023-02-21 DIAGNOSIS — Z1231 Encounter for screening mammogram for malignant neoplasm of breast: Secondary | ICD-10-CM | POA: Diagnosis present

## 2023-02-27 ENCOUNTER — Ambulatory Visit (INDEPENDENT_AMBULATORY_CARE_PROVIDER_SITE_OTHER): Payer: Medicare HMO | Admitting: Psychiatry

## 2023-02-27 DIAGNOSIS — F411 Generalized anxiety disorder: Secondary | ICD-10-CM

## 2023-02-27 DIAGNOSIS — F5089 Other specified eating disorder: Secondary | ICD-10-CM

## 2023-02-27 DIAGNOSIS — Z8659 Personal history of other mental and behavioral disorders: Secondary | ICD-10-CM

## 2023-02-27 DIAGNOSIS — F422 Mixed obsessional thoughts and acts: Secondary | ICD-10-CM | POA: Diagnosis not present

## 2023-02-27 DIAGNOSIS — F3341 Major depressive disorder, recurrent, in partial remission: Secondary | ICD-10-CM

## 2023-02-27 DIAGNOSIS — Z636 Dependent relative needing care at home: Secondary | ICD-10-CM

## 2023-02-27 DIAGNOSIS — Z889 Allergy status to unspecified drugs, medicaments and biological substances status: Secondary | ICD-10-CM

## 2023-02-27 DIAGNOSIS — A310 Pulmonary mycobacterial infection: Secondary | ICD-10-CM
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
## 2023-03-21 ENCOUNTER — Ambulatory Visit (INDEPENDENT_AMBULATORY_CARE_PROVIDER_SITE_OTHER): Payer: Medicare HMO | Admitting: Psychiatry

## 2023-03-21 DIAGNOSIS — F422 Mixed obsessional thoughts and acts: Secondary | ICD-10-CM | POA: Diagnosis not present

## 2023-03-21 DIAGNOSIS — F5089 Other specified eating disorder: Secondary | ICD-10-CM

## 2023-03-21 DIAGNOSIS — F3341 Major depressive disorder, recurrent, in partial remission: Secondary | ICD-10-CM

## 2023-03-21 DIAGNOSIS — Z8659 Personal history of other mental and behavioral disorders: Secondary | ICD-10-CM

## 2023-03-21 DIAGNOSIS — Z889 Allergy status to unspecified drugs, medicaments and biological substances status: Secondary | ICD-10-CM

## 2023-03-21 DIAGNOSIS — F411 Generalized anxiety disorder: Secondary | ICD-10-CM | POA: Diagnosis not present

## 2023-03-21 DIAGNOSIS — Z636 Dependent relative needing care at home: Secondary | ICD-10-CM

## 2023-03-21 DIAGNOSIS — Z630 Problems in relationship with spouse or partner: Secondary | ICD-10-CM

## 2023-03-21 DIAGNOSIS — Z62820 Parent-biological child conflict: Secondary | ICD-10-CM

## 2023-03-21 DIAGNOSIS — A310 Pulmonary mycobacterial infection: Secondary | ICD-10-CM
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
## 2023-03-28 ENCOUNTER — Ambulatory Visit: Payer: Medicare HMO | Admitting: Physician Assistant

## 2023-03-28 ENCOUNTER — Encounter: Payer: Self-pay | Admitting: Physician Assistant

## 2023-03-28 DIAGNOSIS — F411 Generalized anxiety disorder: Secondary | ICD-10-CM

## 2023-03-28 DIAGNOSIS — Z636 Dependent relative needing care at home: Secondary | ICD-10-CM

## 2023-03-28 DIAGNOSIS — F422 Mixed obsessional thoughts and acts: Secondary | ICD-10-CM | POA: Diagnosis not present

## 2023-03-28 MED ORDER — ALPRAZOLAM 0.5 MG PO TABS: ORAL_TABLET | ORAL | 1 refills | Status: DC

## 2023-03-28 NOTE — Progress Notes (Signed)
[facility]  [name],  MRN: [id number]  PCP: Barbette Reichmann, MD  Date of Evaluation: 03/28/2023 Time spent: 26 minutes  Chief Complaint:  Chief Complaint   Anxiety; Follow-up    Virtual Visit via Telehealth  I connected with patient by a video enabled telemedicine application with their informed consent, and verified patient privacy and that I am speaking with the correct person using two identifiers.  I am private, in my office and the patient is at home.  I discussed the limitations, risks, security and privacy concerns of performing an evaluation and management service by telephone video and the availability of in person appointments. I also discussed with the patient that there may be a patient responsible charge related to this service. The patient expressed understanding and agreed to proceed.   I discussed the assessment and treatment plan with the patient. The patient was provided an opportunity to ask questions and all were answered. The patient agreed with the plan and demonstrated an understanding of the instructions.   The patient was advised to call back or seek an in-person evaluation if the symptoms worsen or if the condition fails to improve as anticipated.  I provided 26  minutes of non-face-to-face time during this encounter.  HISTORY/CURRENT STATUS: For routine 6 month med check.  Her husband is getting worse w/ dementia. Under more stress there, 'but it is what it is.' She's eating better and more often. Since she had her GB out 17 years ago, she hasn't felt hungry. She has learned tricks to remind her to eat, which helps. Hasn't gone under 100# this whole year and is happy about that. She feels better physically.   Patient is able to enjoy things.  Energy and motivation are good, she is active in [employer], and gets together with a couple of girlfriends some mornings before it gets too hot.  She is very careful and wears her mask  in public but is able to sit far enough away from them while outside so does not need to wear a mask.  With her lung disease she still has to be very careful to not get sick.  No extreme sadness, tearfulness, or feelings of hopelessness.  Sleeps well most of the time. ADLs and personal hygiene are normal.   Denies any changes in concentration, making decisions, or remembering things.  Denies suicidal or homicidal thoughts.  As far as the anxiety goes, it is well-controlled.  She still wants to be able to decrease the dose or even get off if at all possible, it is just not a good time with her life circumstances to do that.  There have been occasions where she cut the 1/2 pill at lunch and to 1/4 pill and tolerated that fine.  She asks if that would be okay to do if she tolerates it well.  She does not have panic attacks but just gets overwhelmed feeling that something bad may happen if she does not take the Xanax.  Still deals with obsessive thoughts that come and go, more so about germs and cleanliness.  But she understands that some of the issues are from the OCD and she does not get bent out of shape b/c of the thoughts.  They're not paralyzing.  She has been unable to tolerate any medication to prevent the anxiety.  See medication allergy list.  Patient denies increased energy with decreased need for sleep, increased talkativeness, racing thoughts, impulsivity or risky behaviors, increased spending, increased  libido, grandiosity, increased irritability or anger, paranoia, or hallucinations.  Denies dizziness, syncope, seizures, numbness, tingling, tremor, tics, unsteady gait, slurred speech, confusion. Denies muscle or joint pain, stiffness, or dystonia.  Individual Medical History/ Review of Systems: Changes? :No      Past medications for mental health diagnoses include: Zoloft, caused psychosis w/ hallucinations,  BuSpar, Ativan, Effexor (became psychotic) She was told to never take any kind of  SSRI/SNRI again. Wellbutrin caused agitation.  Allergies: Albuterol, Cephalosporins, Flagyl [metronidazole], Macrobid [nitrofurantoin macrocrystal], Neomy-bacit-polymyx-pramoxine, Penicillins, Sulfa antibiotics, Vancomycin, Vicodin [hydrocodone-acetaminophen], Amoxicillin, Ciprofloxacin, Latex, Bacitracin, Bacitracin-neomycin-polymyxin, Barium-containing compounds, Nitrofurantoin, Tobramycin, Decongest-aid [pseudoephedrine], Eggshell membrane (chicken) [egg shells], Hydrocodone-acetaminophen, Levofloxacin, Other, Prednisone, and Zithromax [azithromycin]  Current Medications:  Current Outpatient Medications:    acetaminophen (TYLENOL) 500 MG tablet, Take 500 mg by mouth every 6 (six) hours as needed., Disp: , Rfl:    Artificial Tear Ointment (REFRESH P.M. OP), Place 1 application into both eyes at bedtime. Apply eye gel to both eyelids, Disp: , Rfl:    atenolol (TENORMIN) 25 MG tablet, Take 12.5 mg by mouth 2 (two) times daily. , Disp: , Rfl:    budesonide (PULMICORT) 180 MCG/ACT inhaler, Inhale 2 puffs into the lungs 2 (two) times daily. , Disp: , Rfl:    dicyclomine (BENTYL) 20 MG tablet, Take 1 tablet (20 mg total) by mouth 2 (two) times daily., Disp: 20 tablet, Rfl: 0   fluticasone (FLONASE) 50 MCG/ACT nasal spray, Place 1 spray into both nostrils daily as needed for allergies., Disp: , Rfl: 6   ipratropium (ATROVENT HFA) 17 MCG/ACT inhaler, Inhale 2 puffs into the lungs 4 (four) times daily., Disp: , Rfl:    levalbuterol (XOPENEX HFA) 45 MCG/ACT inhaler, Inhale 2 puffs into the lungs every 4 (four) hours as needed for wheezing., Disp: , Rfl:    Multiple Vitamin (MULTIVITAMIN WITH MINERALS) TABS, Take 1 tablet by mouth daily., Disp: , Rfl:    mupirocin ointment (BACTROBAN) 2 %, APPLY TO AFFECTED AREA 3 TIMES A DAY, Disp: , Rfl:    pantoprazole (PROTONIX) 40 MG tablet, Take 40 mg by mouth daily as needed (GERD). For stomach, Disp: , Rfl:    Polyvinyl Alcohol-Povidone (REFRESH OP), Place 1 drop  into both eyes daily as needed (dry eyes)., Disp: , Rfl:    ZIRGAN 0.15 % GEL, APPLY 1 A SMALL AMOUNT IN AFFECTED EYE 5 TIMES A DAY UNTIL DIRECTED, Disp: , Rfl:    ALPRAZolam (XANAX) 0.5 MG tablet, TAKE 1.5 PILLS IN MORNING, 1.5 A PILLS AT LUNCH, 1 AT DINNER, AND 1.5 PILLS AT BEDTIME, ALL AS NEEDED, Disp: 165 tablet, Rfl: 1   aspirin 81 MG chewable tablet, Chew 81 mg by mouth daily. (Patient not taking: Reported on 04/14/2022), Disp: , Rfl:    Cholecalciferol (VITAMIN D3) 10 MCG (400 UNIT) tablet, Take by mouth. (Patient not taking: Reported on 04/14/2022), Disp: , Rfl:    COVID-19 mRNA bivalent vaccine, Pfizer, (PFIZER COVID-19 VAC BIVALENT) injection, Inject into the muscle. (Patient not taking: Reported on 04/26/2022), Disp: 0.3 mL, Rfl: 0   COVID-19 mRNA vaccine 2023-2024 (COMIRNATY) syringe, Inject into the muscle. (Patient not taking: Reported on 03/28/2023), Disp: 0.3 mL, Rfl: 0   gabapentin (NEURONTIN) 100 MG capsule, Take 100 mg by mouth at bedtime as needed. (Patient not taking: Reported on 04/14/2022), Disp: , Rfl:  Medication Side Effects: none  Family Medical/ Social History: Changes? No  MENTAL HEALTH EXAM:  There were no vitals taken for this visit.There is no height or weight  on file to calculate BMI.  General Appearance: Casual and Well Groomed  Eye Contact:  Good  Speech:  Clear and Coherent, Normal Rate, and Talkative  Volume:  Normal  Mood:  Euthymic  Affect:  Congruent  Thought Process:  Goal Directed and Descriptions of Associations: Intact  Orientation:  Full (Time, Place, and Person)  Thought Content: Logical   Suicidal Thoughts:  No  Homicidal Thoughts:  No  Memory:  WNL  Judgement:  Good   Insight:  Good  Psychomotor Activity:  Normal  Concentration:  Concentration: Good and Attention Span: Good  Recall:  Good  Fund of Knowledge: Good  Language: Good  Assets:  Communication Skills Desire for Improvement Financial Resources/Insurance Housing Transportation   ADL's:  Intact  Cognition: WNL  Prognosis:  Good   DIAGNOSES:    ICD-10-CM   1. 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 OCD - contamination/illness type  F42.2     3. Caregiver stress  Z63.6      Receiving Psychotherapy: Yes w/ Dr. [name]  RECOMMENDATIONS:  PDMP reviewed.  Last Xanax filled 03/04/2023. I provided 26 minutes of non-face-to-face time during this encounter, including time spent before and after the visit in records review, medical decision making, counseling pertinent to today's visit, and charting.   We discussed the anxiety and decreasing the Xanax a little bit at a time.  It is fine for her to decrease the dose, (see below) but if she is unable to tolerate it, she can stick with the dosing as it is on the prescription bottle. She has been unable to tolerate numerous meds that would prevent anxiety.  Continue Xanax 0.5 mg, 1.5 pills every morning, 1.5 pill at lunch (OR 1.25 pills for 2 weeks, then ok to decrease to 1 pill until the next OV), 1 pill at dinner, 1.5 at bedtime as needed.   Continue all supplements. Continue psychotherapy with Dr. [name].  Return in 6 weeks, in person due to Medicare rules.  Melony Overly, PA-C

## 2023-04-10 ENCOUNTER — Ambulatory Visit: Payer: Medicare HMO | Admitting: Psychiatry

## 2023-04-10 DIAGNOSIS — F411 Generalized anxiety disorder: Secondary | ICD-10-CM | POA: Diagnosis not present

## 2023-04-10 DIAGNOSIS — Z636 Dependent relative needing care at home: Secondary | ICD-10-CM

## 2023-04-10 DIAGNOSIS — F422 Mixed obsessional thoughts and acts: Secondary | ICD-10-CM

## 2023-04-10 DIAGNOSIS — Z639 Problem related to primary support group, unspecified: Secondary | ICD-10-CM

## 2023-04-10 DIAGNOSIS — F3341 Major depressive disorder, recurrent, in partial remission: Secondary | ICD-10-CM | POA: Diagnosis not present

## 2023-04-10 NOTE — Progress Notes (Signed)
Psychotherapy Progress Note [facility] [name], PhD LP  [name])    MRN: [id number] Therapy format: Individual psychotherapy Date: 04/10/2023      Start: 11:12a     Stop: 12:29p     Time Spent: 77 min Location: Telehealth visit -- I connected with this patient by an approved telecommunication method (video), with her informed consent, and verifying identity and patient privacy.  I was located at my office and patient at her home.  As needed, we discussed the limitations, risks, and security and privacy concerns associated with telehealth service, including the availability and conditions which currently govern in-person appointments and the possibility that 3rd-party payment may not be fully guaranteed and she may be responsible for charges.  After she indicated understanding, we proceeded with the session.  Also discussed treatment planning, as needed, including ongoing verbal agreement with the plan, the opportunity to ask and answer all questions, her demonstrated understanding of instructions, and her readiness to call the office should symptoms worsen or she feels she is in a crisis state and needs more immediate and tangible assistance.   Session narrative (presenting needs, interim history, self-report of stressors and symptoms, applications of prior therapy, status changes, and interventions made in session) Trying week last week, involving protracted [name] politics around [employer] on behalf of Native [employer], long-awaited approvals to establish a free library at the [name], and apparent pouting on [name]'s part about turf and recognition for the project.  Had a 5-hour phone call with her last Wed night till 3am or so in which [name] rambled on a fantasy of going to the state office to protest their chapter regent in a ginned-up rage about being invalidated and wound up writing a middle-of-the-night diatribe email to  the regent during the conversation.  Been concerned about [name]'s sanity and self-control, enough at the time to consider calling 911.  By this point, has had a followup handling [name]'s reaction to the regent's professional reply last Thursday morning.  Seeing how deep [name]'s resentment runs, and mistrust of authority, and had to field the accusation of backstabbing [name], when the whole project was Gerardine's, in reality, to extend [employer] idea to the tribe.  Became the occasion to yell a bit herself, challenging [name] about what it takes to trust her and countering the idea that Christina (regent) has become the new nemesis.  Finding [name] still nursing resentment and making displays, cold shouldering the regent and making griping calls to others, and emails are presenting challenges to communicate professionally.  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  Personally, upset at [facility] taking the joy out of something she's put in 2 years on, now fighting to keep from being too triggered to compare her to the sister and son Deriona has had estranging experiences with before.  Extended story, and extended effort to settle Shaunika's approach, between the impulse to write her off altogether and the ongoing struggle to suppress giving her feedback.  Admittedly going through a lupus flareup, too, suspected under the stress and drama.  Support/empathy provided.   In good news, she has been titrating Xanax, about to eliminate 1/2 tab at lunch, despite all the drama mentioned.  Affirmed and encouraged.  Therapeutic modalities: Cognitive Behavioral Therapy, Solution-Oriented/Positive Psychology, and Ego-Supportive  Mental Status/Observations:  Appearance:   Casual  Behavior:  Appropriate  Motor:  Normal  Speech/Language:   Clear and Coherent  Affect:  Appropriate  Mood:  dysthymic  Thought  process:  normal  Thought content:    WNL  Sensory/Perceptual disturbances:    WNL  Orientation:  Fully oriented  Attention:  Good    Concentration:  Good  Memory:  WNL  Insight:    Good  Judgment:   Good  Impulse Control:  Good   Risk Assessment: Danger to Self: No Self-injurious Behavior: No Danger to Others: No Physical Aggression / Violence: No Duty to Warn: No Access to Firearms a concern: No  Assessment of progress:  progressing  Diagnosis:   ICD-10-CM   1. 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 Recurrent major depressive disorder, in partial remission (HCC)  F33.41     3. OCD - contamination/illness type  F42.2     4. Caregiver stress  Z63.6     5. Relationship problem with friend  Z63.9      Plan:  Regrets/trauma hx OK to continue reminiscing, helpful to reach the good with the bad Remember, contextualize, forgive ad lib -- [name], parents, and herself Always may, if needed, confront [name] again, if just with the fact that it's abrasive to hear him complain at her about anything when this is the history Phobias -- foodborne and airborne illness Reframe the moment she feels like avoiding food as her golden opportunity to say "yes" and to be in control every bit a much as saying "no" Continue exposures to perceived risk of contamination she knows is minuscule or well-controlled Reduce arbitrary wait time for food, increase bites taken as a "test" experiment with broader and deeper intake Use imaginary [place] as "coach" when facing anxiety  Take food-related dreams as a sign of restoring normal appetite OK to use anorexic videos as inspiration to eat more normally instead  Similarly, assess and relax restrictions and protocols on airborne illness, within reason Continue self-help through [name] website ad lib Physical health Evaluate as needed hearing loss Stretch liberally as able for orthopedic/spinal issues Observe reasonable  standards for airborne infection control given her lung condition For sleep, continue to establish electronics curfew and willingness to turn in For weight maintenance, ensure good calorie density in food choices and sufficient quantity For health, ensure good variety, especially adequate vegetable and fruit nutrients, antioxidant content, and wheat OK Caregiver stress Continue reframing [name]'s behavior sometimes as second childhood -- not the same as re-raising [name] or revisiting her put-upon childhood, but it does mean an imposed parenting role until it transitions to professional care Pursue reasonable collaborations with his treatment team for cognitive health Seek adequate time away from home and perpetual care -- may be opportunities safe enough, where [name] is capable of managing some time alone.  Look into respite care and facility and in-home care alternatives for [name] to get ahead on eventual developments. 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   Assert as needed with friend [name].  OK to be direct, keep it about behavior and its  better/worse effects Family strife Continue willing to assert with B [name] about behaviors that get in his own way  Continue stoic approach to son [name] resentment/rejection.  Refrain from drastic moves to block, just trust she's strong enough to non-reply if what she gets is actually unfair or aggressive. Self-affirm she can legitimately love the child and fear/guard against the adult he became without it being  sinful Other recommendations/advice -- As may be noted above.  Continue to utilize previously learned skills ad lib. Medication compliance -- Maintain medication as prescribed and work faithfully with relevant prescriber(s) if any changes are desired or seem indicated. Crisis service -- Aware of call list and work-in appts.  Call the clinic on-call service, 988/hotline, 911, or present to [facility] or ER if any life-threatening psychiatric crisis. Followup -- Return for time as already scheduled (next in-person).  Next scheduled visit with me 05/19/2023.  Next scheduled in this office 05/08/2023.  [name] Fries, PhD [name], PhD LP Clinical Psychologist, [facility] [address], [address] [place], [place] [address] [phone]

## 2023-05-08 ENCOUNTER — Encounter: Payer: Self-pay | Admitting: Physician Assistant

## 2023-05-08 ENCOUNTER — Ambulatory Visit: Payer: Medicare HMO | Admitting: Physician Assistant

## 2023-05-08 DIAGNOSIS — F411 Generalized anxiety disorder: Secondary | ICD-10-CM | POA: Diagnosis not present

## 2023-05-08 DIAGNOSIS — F4310 Post-traumatic stress disorder, unspecified: Secondary | ICD-10-CM | POA: Diagnosis not present

## 2023-05-08 DIAGNOSIS — F422 Mixed obsessional thoughts and acts: Secondary | ICD-10-CM | POA: Diagnosis not present

## 2023-05-08 DIAGNOSIS — Z636 Dependent relative needing care at home: Secondary | ICD-10-CM

## 2023-05-08 DIAGNOSIS — R002 Palpitations: Secondary | ICD-10-CM

## 2023-05-08 DIAGNOSIS — Z889 Allergy status to unspecified drugs, medicaments and biological substances status: Secondary | ICD-10-CM

## 2023-05-08 MED ORDER — ALPRAZOLAM 0.5 MG PO TABS
ORAL_TABLET | ORAL | 5 refills | Status: DC
Start: 2023-05-08 — End: 2023-11-08

## 2023-05-08 NOTE — Progress Notes (Signed)
[facility]  [name],  MRN: [id number]  PCP: Barbette Reichmann, MD  Date of Evaluation: 05/08/2023 Time spent:30 minutes  Chief Complaint:  Chief Complaint   Anxiety; Follow-up    HISTORY/CURRENT STATUS: For routine med check.  We decreased the Xanax by 0.125 mg at lunch. She's tolerated it well and still wants to gradually decrease even more. Has been on it for decades. Is still under a lot of stress, husband with dementia, has friendship issue, her autoimmune problems that are worsening, PTSD and other health problems. No PA but gets overwhelmed a lot.  Since our last visit 6 weeks ago she has had palpitations and tachycardia on 2 separate occasions.  That has happened to her in the past so she is not sure if it is related to the decreased Xanax or not.  The tachycardia has lasted 3 hours 1 of those times, her pulse got up 250 but gradually decreased back to normal.  The other time it did not go that high.  She had no chest pain associated with it.  Patient is able to enjoy things.  Energy and motivation are good.  No extreme sadness, tearfulness, or feelings of hopelessness.  Sleeps well most of the time. ADLs and personal hygiene are normal.   Denies any changes in concentration, making decisions, or remembering things. Appetite is good, has purposely gaining wt, has gained about 7# this year and happy about that.  Denies suicidal or homicidal thoughts.  Patient denies increased energy with decreased need for sleep, increased talkativeness, racing thoughts, impulsivity or risky behaviors, increased spending, increased libido, grandiosity, increased irritability or anger, paranoia, or hallucinations.  Denies dizziness, syncope, seizures, numbness, tingling, tremor, tics, unsteady gait, slurred speech, confusion. Denies muscle or joint pain, stiffness, or dystonia.  Individual Medical History/ Review of Systems: Changes? :No      Past medications  for mental health diagnoses include: Zoloft, caused psychosis w/ hallucinations,  BuSpar, Ativan, Effexor (became psychotic) She was told to never take any kind of SSRI/SNRI again. Wellbutrin caused agitation.  Allergies: Albuterol, Cephalosporins, Flagyl [metronidazole], Macrobid [nitrofurantoin macrocrystal], Neomy-bacit-polymyx-pramoxine, Penicillins, Sulfa antibiotics, Vancomycin, Vicodin [hydrocodone-acetaminophen], Amoxicillin, Ciprofloxacin, Latex, Bacitracin, Bacitracin-neomycin-polymyxin, Barium-containing compounds, Nitrofurantoin, Tobramycin, Decongest-aid [pseudoephedrine], Eggshell membrane (chicken) [egg shells], Hydrocodone-acetaminophen, Levofloxacin, Other, Prednisone, and Zithromax [azithromycin]  Current Medications:  Current Outpatient Medications:    acetaminophen (TYLENOL) 500 MG tablet, Take 500 mg by mouth every 6 (six) hours as needed., Disp: , Rfl:    Artificial Tear Ointment (REFRESH P.M. OP), Place 1 application into both eyes at bedtime. Apply eye gel to both eyelids, Disp: , Rfl:    atenolol (TENORMIN) 25 MG tablet, Take 12.5 mg by mouth 2 (two) times daily. , Disp: , Rfl:    budesonide (PULMICORT) 180 MCG/ACT inhaler, Inhale 2 puffs into the lungs 2 (two) times daily. , Disp: , Rfl:    dicyclomine (BENTYL) 20 MG tablet, Take 1 tablet (20 mg total) by mouth 2 (two) times daily., Disp: 20 tablet, Rfl: 0   fluticasone (FLONASE) 50 MCG/ACT nasal spray, Place 1 spray into both nostrils daily as needed for allergies., Disp: , Rfl: 6   ipratropium (ATROVENT HFA) 17 MCG/ACT inhaler, Inhale 2 puffs into the lungs 4 (four) times daily., Disp: , Rfl:    levalbuterol (XOPENEX HFA) 45 MCG/ACT inhaler, Inhale 2 puffs into the lungs every 4 (four) hours as needed for wheezing., Disp: , Rfl:    Multiple Vitamin (MULTIVITAMIN WITH MINERALS) TABS, Take 1 tablet by  mouth daily., Disp: , Rfl:    mupirocin ointment (BACTROBAN) 2 %, APPLY TO AFFECTED AREA 3 TIMES A DAY, Disp: , Rfl:     pantoprazole (PROTONIX) 40 MG tablet, Take 40 mg by mouth daily as needed (GERD). For stomach, Disp: , Rfl:    Polyvinyl Alcohol-Povidone (REFRESH OP), Place 1 drop into both eyes daily as needed (dry eyes)., Disp: , Rfl:    ZIRGAN 0.15 % GEL, APPLY 1 A SMALL AMOUNT IN AFFECTED EYE 5 TIMES A DAY UNTIL DIRECTED, Disp: , Rfl:    ALPRAZolam (XANAX) 0.5 MG tablet, TAKE 1.5 PILLS IN MORNING, 1.5 A PILLS AT LUNCH, 1 AT DINNER, AND 1.5 PILLS AT BEDTIME, ALL AS NEEDED, Disp: 165 tablet, Rfl: 5   aspirin 81 MG chewable tablet, Chew 81 mg by mouth daily. (Patient not taking: Reported on 04/14/2022), Disp: , Rfl:    Cholecalciferol (VITAMIN D3) 10 MCG (400 UNIT) tablet, Take by mouth. (Patient not taking: Reported on 04/14/2022), Disp: , Rfl:    COVID-19 mRNA bivalent vaccine, Pfizer, (PFIZER COVID-19 VAC BIVALENT) injection, Inject into the muscle. (Patient not taking: Reported on 04/26/2022), Disp: 0.3 mL, Rfl: 0   COVID-19 mRNA vaccine 2023-2024 (COMIRNATY) syringe, Inject into the muscle. (Patient not taking: Reported on 03/28/2023), Disp: 0.3 mL, Rfl: 0   gabapentin (NEURONTIN) 100 MG capsule, Take 100 mg by mouth at bedtime as needed. (Patient not taking: Reported on 04/14/2022), Disp: , Rfl:  Medication Side Effects: none  Family Medical/ Social History: Changes? No  MENTAL HEALTH EXAM:  There were no vitals taken for this visit.There is no height or weight on file to calculate BMI.  General Appearance: Casual and Well Groomed  Eye Contact:  Good  Speech:  Clear and Coherent and Normal Rate  Volume:  Normal  Mood:  Euthymic  Affect:  Congruent  Thought Process:  Goal Directed and Descriptions of Associations: Intact  Orientation:  Full (Time, Place, and Person)  Thought Content: Logical   Suicidal Thoughts:  No  Homicidal Thoughts:  No  Memory:  WNL  Judgement:  Good   Insight:  Good  Psychomotor Activity:  Normal  Concentration:  Concentration: Good and Attention Span: Good  Recall:  Good   Fund of Knowledge: Good  Language: Good  Assets:  Communication Skills Desire for Improvement Financial Resources/Insurance Housing Resilience Transportation  ADL's:  Intact  Cognition: WNL  Prognosis:  Good   DIAGNOSES:    ICD-10-CM   1. 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 OCD - contamination/illness type  F42.2     3. Caregiver stress  Z63.6     4. PTSD (post-traumatic stress disorder)  F43.10     5. Multiple allergies, with complex chronic pulmonary disease, and multiple immune illnesses  Z88.9     6. Palpitations  R00.2      Receiving Psychotherapy: Yes w/ Dr. [name]  RECOMMENDATIONS:  PDMP reviewed.  Last Xanax filled 05/02/2023. I provided 30 minutes of face to face time during this encounter, including time spent before and after the visit in records review, medical decision making, counseling pertinent to today's visit, and charting.   She has an appointment with rheumatology and her PCP within the next few weeks.  She will discuss the tachycardia and palpitations with her PCP but knows to go to the ER if they should recur or worsen.  If it does happen again, she should increase the Xanax back to 1.5 pills  at lunchtime as she did prior to our last visit.  She verbalizes understanding.   She's stable on Xanax, has been on it for many years and had reactions to many antidepressants and anxiety meds, so needs the BZ alone. We discussed the risks of benzodiazepines including sedation, increased risk of falling, dizziness, tolerance, and addictive potential. She understands and accepts.   Continue Xanax 0.5 mg, 1.5 pills every morning, 1.25 pills at lunch, 1 pill at dinner, 1.5 at bedtime as needed.  (I am leaving the prescription directions at the same so she will have enough meds if she needs to increase the dose. Continue all supplements. Continue psychotherapy with Dr. [name].  Return in 6 months.   Melony Overly, PA-C

## 2023-05-12 ENCOUNTER — Ambulatory Visit: Payer: Medicare HMO | Admitting: Psychiatry

## 2023-05-12 DIAGNOSIS — F411 Generalized anxiety disorder: Secondary | ICD-10-CM

## 2023-05-12 DIAGNOSIS — Z636 Dependent relative needing care at home: Secondary | ICD-10-CM

## 2023-05-12 DIAGNOSIS — Z8659 Personal history of other mental and behavioral disorders: Secondary | ICD-10-CM

## 2023-05-12 DIAGNOSIS — F422 Mixed obsessional thoughts and acts: Secondary | ICD-10-CM | POA: Diagnosis not present

## 2023-05-12 DIAGNOSIS — Z639 Problem related to primary support group, unspecified: Secondary | ICD-10-CM

## 2023-05-12 DIAGNOSIS — F5089 Other specified eating disorder: Secondary | ICD-10-CM

## 2023-05-12 DIAGNOSIS — F3341 Major depressive disorder, recurrent, in partial remission: Secondary | ICD-10-CM | POA: Diagnosis not present

## 2023-05-12 NOTE — Progress Notes (Signed)
Psychotherapy Progress Note [facility] [name], PhD LP  [name])    MRN: [id number] Therapy format: Individual psychotherapy Date: 05/12/2023      Start: 1:10p     Stop: 2:20p     Time Spent: 70 min Location: Telehealth visit -- I connected with this patient by an approved telecommunication method (video), with her informed consent, and verifying identity and patient privacy.  I was located at my office and patient at her home.  As needed, we discussed the limitations, risks, and security and privacy concerns associated with telehealth service, including the availability and conditions which currently govern in-person appointments and the possibility that 3rd-party payment may not be fully guaranteed and she may be responsible for charges.  After she indicated understanding, we proceeded with the session.  Also discussed treatment planning, as needed, including ongoing verbal agreement with the plan, the opportunity to ask and answer all questions, her demonstrated understanding of instructions, and her readiness to call the office should symptoms worsen or she feels she is in a crisis state and needs more immediate and tangible assistance.   Session narrative (presenting needs, interim history, self-report of stressors and symptoms, applications of prior therapy, status changes, and interventions made in session) Suffering ragweed exposure today.  Admittedly up late (5am, last night) with a preference to not have to deal with uncomfortable people.  Alleges friend Sandra Burns has been posting social media messages that seem to be veiled whining about what "real" friends do.  Admittedly prone to mindreading, but feels she's on target, and notes some things she's taken as catty from Sandra Burns.  Admits some passive-aggressive communication on her own part, and knowledge of her trying to do a couple of underhanded things in the [name], but stuck on her veiled  messages.  Attempted to challenge Sandra Burns about how much energy she wants to put into analyzing and decoding it all, and clarifying what she actually wants, but focused on other things, and held forth a long time with details .  Insight how this reminds her of unhealthy dynamics with her sisters, and having to urgently represent herself until she woke up to it.  It is all also pushing Sandra Burns buttons, for having to deal with a deeply resentful loved one.  Discussed at some length attributions and options for responding to issues noted, emphasizing benevolent interpretation of Sandra Burns's behavior and making sure she can tell the difference between a friend overtalking an issue and possibly stewing but keeping it to herself and the son she regards as sociopathic.  Therapeutic modalities: Cognitive Behavioral Therapy, Solution-Oriented/Positive Psychology, and Ego-Supportive  Mental Status/Observations:  Appearance:   Casual     Behavior:  Monopolizing  Motor:  Normal  Speech/Language:   Clear and Coherent  Affect:  Appropriate  Mood:  dysthymic  Thought process:  normal and inclusive  Thought content:    WNL  Sensory/Perceptual disturbances:    WNL  Orientation:  Fully oriented  Attention:  Good    Concentration:  Good  Memory:  WNL  Insight:    Good  Judgment:   Good  Impulse Control:  Good   Risk Assessment: Danger to Self: No Self-injurious Behavior: No Danger to Others: No Physical Aggression / Violence: No Duty to Warn: No Access to Firearms a concern: No  Assessment of progress:  stabilized  Diagnosis:   ICD-10-CM   1. Generalized anxiety disorder with hx panic & agoraphobia  F41.1  2. OCD - contamination/illness type  F42.2     3. Caregiver stress  Z63.6     4. Recurrent major depressive disorder, in partial remission (HCC)  F33.41     5. Relationship problem with friend  Z63.9     6. History of posttraumatic stress disorder (PTSD)  Z86.59     7. Orthorexia nervosa   F50.89      Plan:  Regrets/trauma hx OK to continue reminiscing, helpful to reach the good with the bad Remember, contextualize, forgive ad lib -- Sandra Burns, parents, and herself Always may, if needed, confront Sandra Burns again, if just with the fact that it's abrasive to hear him complain at her about anything when this is the history Phobias -- foodborne and airborne illness Reframe the moment she feels like avoiding food as her golden opportunity to say "yes" and to be in control every bit a much as saying "no" Continue exposures to perceived risk of contamination she knows is minuscule or well-controlled Reduce arbitrary wait time for food, increase bites taken as a "test" experiment with broader and deeper intake Use imaginary TX as "coach" when facing anxiety  Take food-related dreams as a sign of restoring normal appetite OK to use anorexic videos as inspiration to eat more normally instead  Similarly, assess and relax restrictions and protocols on airborne illness, within reason Continue self-help through Sandra Burns website ad lib Physical health Evaluate as needed hearing loss Stretch liberally as able for orthopedic/spinal issues Observe reasonable standards for airborne infection control given her lung condition For sleep, continue to establish electronics curfew and willingness to turn in For weight maintenance, ensure good calorie density in food choices and sufficient quantity For health, ensure good variety, especially adequate vegetable and fruit nutrients, antioxidant content, and wheat OK Caregiver stress Continue reframing Sandra Burns's behavior sometimes as second childhood -- not the same as re-raising Sandra Burns or revisiting her put-upon childhood, but it does mean an imposed parenting role until it transitions to professional care Pursue reasonable collaborations with his treatment team for cognitive health Seek adequate time away from home and perpetual care -- may be  opportunities safe enough, where Sandra Burns is capable of managing some time alone.  Look into respite care and facility and in-home care alternatives for Sandra Burns to get ahead on eventual developments. 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   Assert as needed with friend Sandra Burns.  OK to be direct, keep it about behavior and its better/worse effects.  Try to maintain benevolent interpretation, resist equating her with Sandra Burns or other treacherous personalities. Family strife Continue willing to assert with B Sandra Burns about behaviors that get in his own way  Continue stoic approach to son Sandra Burns's resentment/rejection.  Refrain from drastic moves to block, just trust she's strong enough to non-reply if what she gets is actually unfair or aggressive. Self-affirm she can legitimately love the child and fear/guard against the adult he became without it being sinful Other recommendations/advice -- As may be noted above.  Continue to utilize previously learned skills ad lib. Medication compliance -- Maintain medication as prescribed and work faithfully with relevant  prescriber(s) if any changes are desired or seem indicated. Crisis service -- Aware of call list and work-in appts.  Call the clinic on-call service, 988/hotline, 911, or present to [facility] or ER if any life-threatening psychiatric crisis. Followup -- No follow-ups on file.  Next scheduled visit with me 05/19/2023.  Next scheduled in this office 05/19/2023.  [name] Fries, PhD [name], PhD LP Clinical Psychologist, [facility] [address] [place], [place] [address] [phone]

## 2023-05-19 ENCOUNTER — Ambulatory Visit (INDEPENDENT_AMBULATORY_CARE_PROVIDER_SITE_OTHER): Payer: Medicare HMO | Admitting: Psychiatry

## 2023-05-19 DIAGNOSIS — Z639 Problem related to primary support group, unspecified: Secondary | ICD-10-CM

## 2023-05-19 DIAGNOSIS — F3341 Major depressive disorder, recurrent, in partial remission: Secondary | ICD-10-CM | POA: Diagnosis not present

## 2023-05-19 DIAGNOSIS — F5089 Other specified eating disorder: Secondary | ICD-10-CM | POA: Diagnosis not present

## 2023-05-19 DIAGNOSIS — F411 Generalized anxiety disorder: Secondary | ICD-10-CM | POA: Diagnosis not present

## 2023-05-19 DIAGNOSIS — A310 Pulmonary mycobacterial infection: Secondary | ICD-10-CM | POA: Diagnosis not present

## 2023-05-19 DIAGNOSIS — Z8659 Personal history of other mental and behavioral disorders: Secondary | ICD-10-CM

## 2023-05-19 DIAGNOSIS — Z636 Dependent relative needing care at home: Secondary | ICD-10-CM

## 2023-05-19 NOTE — Progress Notes (Signed)
Psychotherapy Progress Note [facility] [name], PhD LP  Patient ID: Marysa Hirayama)    MRN: [id number] Therapy format: Individual psychotherapy Date: 05/19/2023      Start: 3:23p     Stop: 4:28p     Time Spent: 65 min Location: Telehealth visit -- I connected with this patient by an approved telecommunication method (video), with her informed consent, and verifying identity and patient privacy.  I was located at my office and patient at her home.  As needed, we discussed the limitations, risks, and security and privacy concerns associated with telehealth service, including the availability and conditions which currently govern in-person appointments and the possibility that 3rd-party payment may not be fully guaranteed and she may be responsible for charges.  After she indicated understanding, we proceeded with the session.  Also discussed treatment planning, as needed, including ongoing verbal agreement with the plan, the opportunity to ask and answer all questions, her demonstrated understanding of instructions, and her readiness to call the office should symptoms worsen or she feels she is in a crisis state and needs more immediate and tangible assistance.   Session narrative (presenting needs, interim history, self-report of stressors and symptoms, applications of prior therapy, status changes, and interventions made in session) No issue with video cutoff last week -- it was her internet going out.  Issue today with a fellow [name] (treasurer) overtalking, getting negative, and stubbornly overreaching rules and roles.  Rough week this week with feeling sick, too, needing testing for COVID 2x, and dealing with a phlebotomist ignorantly cross-contaminating a syringe she dropped on the floor and getting enough flak for asking her to observe careful procedures that she called Patient Relations afterwards (very graciously, as is her way).  Allowed to vent.  Back to  relations with [name], had cause to talk yesterday about some [name] business and eerily seemed like there was no tension or issue whatsoever for her, told she loves her.  Doesn't jive with 6 wks of little contact, and having been tense about [employer] situation, suddenly too warm for what has transpired.  Can only figure either it's reaction formation or temporary giddiness about the [employer] coming off.  Allowed to vent, encouraged not to rad too much in, that it is entirely possible [name] has thought better of things but really is not aware that her silence would come across as chilly.  Reminded her she was stating the need to get away from her herself not long ago.    Acknowledged abandonment issues and hard-triggered "wall" that comes up within her once she's been wronged.  Admittedly resentful -- and guilty about it -- for getting stuck with the cost of [employer] project since [place] failed to fulfill her pledge to split the underwriting.  Supportively confronted personalized thinking and unrealistic expectations what her friend understands.  Helped to get psychiatric feedback that to her tormented friend, it's still her reality.  Also has given thought to [place]x feedback that she was not "stupid" before but got let down, and "stupid" is the feeling that comes with losing illusions or innocence.  Found recently that she could take [name]'s call and listen empathetically for an hour about a situation.  Affirmed and encouraged.  Also helped to read about Langston Masker and his OCD.  Likens it to her son, in part, where she has come to more acceptance that they will just be estranged, and this demoted friendship with [name] will just have to  be.  Encouraged to leave room for [name] to learn and be sure she actually has asked what she wants, because most people are not as intuitive as Maribeth, and her friend may well be still on for caring, and regretful about the spat, but blind as yet about how it sits with  Kimmy and unprepared to figure it out on her own.  Therapeutic modalities: Cognitive Behavioral Therapy, Solution-Oriented/Positive Psychology, Ego-Supportive, Assertiveness/Communication, and Interpersonal  Mental Status/Observations:  Appearance:   Casual     Behavior:  Appropriate  Motor:  Normal  Speech/Language:   Clear and Coherent  Affect:  Appropriate  Mood:  dysthymic  Thought process:  normal  Thought content:    WNL  Sensory/Perceptual disturbances:    WNL  Orientation:  Fully oriented  Attention:  Good    Concentration:  Good  Memory:  WNL  Insight:    Good  Judgment:   Good  Impulse Control:  Good   Risk Assessment: Danger to Self: No Self-injurious Behavior: No Danger to Others: No Physical Aggression / Violence: No Duty to Warn: No Access to Firearms a concern: No  Assessment of progress:  stabilized  Diagnosis:   ICD-10-CM   1. 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 Relationship problem with friend  Z63.9     3. Caregiver stress  Z63.6     4. Orthorexia nervosa  F50.89     5. Recurrent major depressive disorder, in partial remission (HCC)  F33.41     6. History of posttraumatic stress disorder (PTSD)  Z86.59     7. Mycobacterium avium complex (HCC)  A31.0      Plan:  Regrets/trauma hx OK to continue reminiscing, helpful to reach the good with the bad Remember, contextualize, forgive ad lib -- [name], parents, and herself Always may, if needed, confront [name] again, if just with the fact that it's abrasive to hear him complain at her about anything when this is the history Phobias -- foodborne and airborne illness Reframe the moment she feels like avoiding food as her golden opportunity to say "yes" and to be in control every bit a much as saying "no" Continue exposures to perceived risk of contamination she knows is minuscule or well-controlled Reduce arbitrary wait time for food, increase bites taken as a  "test" experiment with broader and deeper intake Use imaginary [place] as "coach" when facing anxiety  Take food-related dreams as a sign of restoring normal appetite OK to use anorexic videos as inspiration to eat more normally instead  Similarly, assess and relax restrictions and protocols on airborne illness, within reason Continue self-help through [name] website ad lib Physical health Evaluate as needed hearing loss Stretch liberally as able for orthopedic/spinal issues Observe reasonable standards for airborne infection control given her lung condition For sleep, continue to establish electronics curfew and willingness to turn in For weight maintenance, ensure good calorie density in food choices and sufficient quantity For health, ensure good variety, especially adequate vegetable and fruit nutrients, antioxidant content, and wheat OK Caregiver stress Continue reframing [name]'s behavior sometimes as second childhood -- not the same as re-raising [name] or revisiting her put-upon childhood, but it does mean an imposed parenting role until it transitions to professional care Pursue reasonable collaborations with his treatment team for cognitive health Seek adequate time away from home and perpetual care -- may be opportunities safe enough, where [name] is capable of managing some time alone.  Look  into respite care and facility and in-home care alternatives for [name] to get ahead on eventual developments. 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   Assert as  needed with friend [name].  OK to be direct, keep it about behavior and its better/worse effects.  Try to maintain benevolent interpretation, resist equating her with [name] or other treacherous personalities.  Assume not knowing before not caring. Family strife Continue willing to assert with B [name] about behaviors that get in his own way  Continue stoic approach to son [name] resentment/rejection.  Refrain from drastic moves to block, just trust she's strong enough to non-reply if what she gets is actually unfair or aggressive. Self-affirm she can legitimately love the child and fear/guard against the adult he became without it being sinful Other recommendations/advice -- As may be noted above.  Continue to utilize previously learned skills ad lib. Medication compliance -- Maintain medication as prescribed and work faithfully with relevant prescriber(s) if any changes are desired or seem indicated. Crisis service -- Aware of call list and work-in appts.  Call the clinic on-call service, 988/hotline, 911, or present to [facility] or ER if any life-threatening psychiatric crisis. Followup -- No follow-ups on file.  Next scheduled visit with me 06/05/2023.  Next scheduled in this office 06/05/2023.  [name] Fries, PhD [name], PhD LP Clinical Psychologist, [facility] [address] [place], [place] [address] [phone]

## 2023-06-05 ENCOUNTER — Ambulatory Visit (INDEPENDENT_AMBULATORY_CARE_PROVIDER_SITE_OTHER): Payer: Medicare HMO | Admitting: Psychiatry

## 2023-06-05 ENCOUNTER — Telehealth: Payer: Self-pay

## 2023-06-05 DIAGNOSIS — F411 Generalized anxiety disorder: Secondary | ICD-10-CM

## 2023-06-05 DIAGNOSIS — Z639 Problem related to primary support group, unspecified: Secondary | ICD-10-CM

## 2023-06-05 DIAGNOSIS — Z8659 Personal history of other mental and behavioral disorders: Secondary | ICD-10-CM

## 2023-06-05 DIAGNOSIS — Z9189 Other specified personal risk factors, not elsewhere classified: Secondary | ICD-10-CM

## 2023-06-05 DIAGNOSIS — A310 Pulmonary mycobacterial infection: Secondary | ICD-10-CM | POA: Diagnosis not present

## 2023-06-05 DIAGNOSIS — F330 Major depressive disorder, recurrent, mild: Secondary | ICD-10-CM | POA: Diagnosis not present

## 2023-06-05 DIAGNOSIS — Z636 Dependent relative needing care at home: Secondary | ICD-10-CM

## 2023-06-05 DIAGNOSIS — F422 Mixed obsessional thoughts and acts: Secondary | ICD-10-CM

## 2023-06-05 NOTE — Telephone Encounter (Signed)
Patient called stating that she seen her PCP and discussed possibly getting the children Covid vaccine due to her weight and size. Patient developed a rash at injection site and felt bad for nearly 3 days after getting COVID vaccine in January. PCP stated that it could be because of her weight and that she is getting the adult COVID vaccine each time. Patient wanted to know Dr.Van Dam thoughts and if you think she would do better getting the children's Covid vaccine or stick with the adult vaccine. If you agree that she should receive the children's vaccine she asked if you could write a letter stating that its appropriate for her to take to her pharmacy to get the child COVID vaccine.    Zaryia Markel Lesli Albee, CMA

## 2023-06-05 NOTE — Progress Notes (Signed)
Psychotherapy Progress Note [facility] Sandra [name], PhD LP  [name])    MRN: [id number] Therapy format: Individual psychotherapy Date: 06/05/2023      Start: 2:11p     Stop: 3:30p     Time Spent: 79 min Location: Telehealth visit -- I connected with this patient by an approved telecommunication method (video), with her informed consent, and verifying identity and patient privacy.  I was located at my office and patient at her home.  As needed, we discussed the limitations, risks, and security and privacy concerns associated with telehealth service, including the availability and conditions which currently govern in-person appointments and the possibility that 3rd-party payment may not be fully guaranteed and she may be responsible for charges.  After she indicated understanding, we proceeded with the session.  Also discussed treatment planning, as needed, including ongoing verbal agreement with the plan, the opportunity to ask and answer all questions, her demonstrated understanding of instructions, and her readiness to call the office should symptoms worsen or she feels she is in a crisis state and needs more immediate and tangible assistance.   Session narrative (presenting needs, interim history, self-report of stressors and symptoms, applications of prior therapy, status changes, and interventions made in session) Feeling a little more overwhelmed.  Lots of [name] projects.  Wants to get a mountain trip with Sandra Burns this month (didn't get one last year).  Detailed about considerations and tactics for exposure to COVID and other viruses, apparently Sandra Burns remains more highly sensitized than she now.  Understandably tired of worrying about protective interventions, too.  Plans to stay close to the local hospital in [place], owing to M's story of heart attack 20 min away from care up there, and her later death by stomach bleeding and sepsis caused by  medical error.  Side story of how that went down.  Notes how most everything they mean to do is outdoors, so she may just skip the vaccine.  Affirmed and encouraged in scouting possibilities and letting themselves gt a good break from being so typically shut in at home.    Concern around getting an update COVID vaccination, as she has a hx of localized reactions, sensitizing over time.  From description, it sounds like hr reactions are to various immunizations, not just COVID, and SE are a deterrent.  Offered that she could look into getting a pediatric dose, given her small size, and solicit physician's order if needed.    With the full session time spent, briefly followed up on the friend situation.  Says it's no better.  Sandra Burns gets in touch by email, tends to keep it breezy then dump about problems, blows hot and cold in Sandra Burns's opinion.  Trying to mostly just keep from over-responding to her, but inevitably she's tempted to analyze her motives and take some things personally.  Still finds it reminiscent of her sister's manipulation and historical breakdown, and it's hard to separate feeling taken for granted.  Supportively confronted susceptibility to ruminating and encouraged to note the differences as much as possible, still consider that Sandra Burns is not nearly as intuitive as she, and be willing to either ask for a listen or consider it enough learned making her read into new ones.  Therapeutic modalities: Cognitive Behavioral Therapy, Solution-Oriented/Positive Psychology, Ego-Supportive, and Interpersonal  Mental Status/Observations:  Appearance:   Casual     Behavior:  Appropriate  Motor:  Normal  Speech/Language:   Clear and Coherent  Affect:  Appropriate  Mood:  dysthymic, varied with subject  Thought process:  normal  Thought content:    Some rumination  Sensory/Perceptual disturbances:    WNL  Orientation:  Fully oriented  Attention:  Good    Concentration:  Good  Memory:  WNL   Insight:    Variable  Judgment:   Good  Impulse Control:  Good   Risk Assessment: Danger to Self: No Self-injurious Behavior: No Danger to Others: No Physical Aggression / Violence: No Duty to Warn: No Access to Firearms a concern: No  Assessment of progress:  stabilized  Diagnosis:   ICD-10-CM   1. Major depressive disorder, recurrent episode, mild with melancholic features (HCC)  F33.0     2. History of posttraumatic stress disorder (PTSD)  Z86.59     3. Generalized anxiety disorder with hx panic & agoraphobia  F41.1     4. OCD - contamination/illness type  F42.2     5. Relationship problem with friend  Z63.9     6. Caregiver stress  Z63.6     7. Mycobacterium avium complex (HCC)  A31.0     8. At risk for imbalanced nutrition  Z91.89      Plan:  Regrets/trauma hx OK to continue reminiscing, helpful to reach the good with the bad Remember, contextualize, forgive ad lib -- Sandra Burns, parents, and herself Always may, if needed, confront Sandra Burns again, if just with the fact that it's abrasive to hear him complain at her about anything when this is the history Phobias -- foodborne and airborne illness Reframe the moment she feels like avoiding food as her golden opportunity to say "yes" and to be in control every bit a much as saying "no" Continue exposures to perceived risk of contamination she knows is minuscule or well-controlled Reduce arbitrary wait time for food, increase bites taken as a "test" experiment with broader and deeper intake Use imaginary TX as "coach" when facing anxiety  Take food-related dreams as a sign of restoring normal appetite OK to use anorexic videos as inspiration to eat more normally instead  Similarly, assess and relax restrictions and protocols on airborne illness, within reason Continue self-help through [name] website ad lib Physical health Evaluate as needed hearing loss Stretch liberally as able for orthopedic/spinal issues Observe  reasonable standards for airborne infection control given her lung condition For sleep, continue to establish electronics curfew and willingness to turn in For weight maintenance, ensure good calorie density in food choices and sufficient quantity For health, ensure good variety, especially adequate vegetable and fruit nutrients, antioxidant content, and wheat OK Caregiver stress Continue reframing [name]'s behavior sometimes as second childhood -- not the same as re-raising [name] or revisiting her put-upon childhood, but it does mean an imposed parenting role until it transitions to professional care Pursue reasonable collaborations with his treatment team for cognitive health Seek adequate time away from home and perpetual care -- may be opportunities safe enough, where Sandra Burns is capable of managing some time alone.  Look into respite care and facility and in-home care alternatives for Sandra Burns to get ahead on eventual developments. If interested, connect with dementia support organization(s) As long as his history of infidelity is not worth addressing, then see forgiveness as merely a way to unburden herself of resentment Social support Work assertiveness with friends to get adequate time listened to, not just falling into the automatic listener role Continuing discretion disclosing to friends and asking support and her turn  Manage her work/service and her  limits with [name] and other organizations.   Assert as needed with friend Sandra Burns.  OK to be direct, keep it about behavior and its better/worse effects.  Try to maintain benevolent interpretation, resist equating her with [name] or other treacherous personalities.  Assume not knowing before not caring. Family strife Continue willing to assert with B Sandra Burns about behaviors that get in his own way  Continue stoic approach to son [name] resentment/rejection.  Refrain from drastic moves to block, just trust she's strong enough to non-reply if  what she gets is actually unfair or aggressive. Self-affirm she can legitimately love the child and fear/guard against the adult he became without it being sinful Other recommendations/advice -- As may be noted above.  Continue to utilize previously learned skills ad lib. Medication compliance -- Maintain medication as prescribed and work faithfully with relevant prescriber(s) if any changes are desired or seem indicated. Crisis service -- Aware of call list and work-in appts.  Call the clinic on-call service, 988/hotline, 911, or present to [facility] or ER if any life-threatening psychiatric crisis. Followup -- Return for time as available.  Next scheduled visit with me Visit date not found.  Next scheduled in this office 11/08/2023.  [name] Fries, PhD Sandra [name], PhD LP Clinical Psychologist, [facility] [address] [place], [place] [address] [phone]

## 2023-06-15 ENCOUNTER — Other Ambulatory Visit: Payer: Self-pay

## 2023-06-15 MED ORDER — COMIRNATY 30 MCG/0.3ML IM SUSY
0.3000 mL | PREFILLED_SYRINGE | INTRAMUSCULAR | 0 refills | Status: AC
  Filled 2023-06-15: qty 0.3, 1d supply, fill #0

## 2023-06-26 ENCOUNTER — Ambulatory Visit (INDEPENDENT_AMBULATORY_CARE_PROVIDER_SITE_OTHER): Payer: Medicare HMO | Admitting: Psychiatry

## 2023-06-26 DIAGNOSIS — F3341 Major depressive disorder, recurrent, in partial remission: Secondary | ICD-10-CM | POA: Diagnosis not present

## 2023-06-26 DIAGNOSIS — F411 Generalized anxiety disorder: Secondary | ICD-10-CM

## 2023-06-26 DIAGNOSIS — Z639 Problem related to primary support group, unspecified: Secondary | ICD-10-CM | POA: Diagnosis not present

## 2023-06-26 DIAGNOSIS — Z636 Dependent relative needing care at home: Secondary | ICD-10-CM

## 2023-06-26 DIAGNOSIS — Z8659 Personal history of other mental and behavioral disorders: Secondary | ICD-10-CM
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
## 2023-07-06 ENCOUNTER — Other Ambulatory Visit (INDEPENDENT_AMBULATORY_CARE_PROVIDER_SITE_OTHER): Payer: Self-pay | Admitting: Vascular Surgery

## 2023-07-06 DIAGNOSIS — I700 Atherosclerosis of aorta: Secondary | ICD-10-CM

## 2023-07-10 ENCOUNTER — Telehealth (INDEPENDENT_AMBULATORY_CARE_PROVIDER_SITE_OTHER): Payer: Self-pay

## 2023-07-10 NOTE — Telephone Encounter (Signed)
Patient left a message stating for past couple months when her legs are exposed to a heat she begins having a red spots. Currently she has rash from being out in the sun. The patient informed that red spots can get to quarter to half dollar size after taking a shower. She notice that cold compresses do help with relief. Patient thinks that's this could be related to lupus. I did recommend patient to contact her PCP or rheumatologist. Patient did request for vascular advice. Please Advise

## 2023-07-10 NOTE — Telephone Encounter (Signed)
Patient verbalized understanding to medical recommendations

## 2023-07-11 ENCOUNTER — Encounter (INDEPENDENT_AMBULATORY_CARE_PROVIDER_SITE_OTHER): Payer: Medicare HMO

## 2023-07-11 ENCOUNTER — Ambulatory Visit (INDEPENDENT_AMBULATORY_CARE_PROVIDER_SITE_OTHER): Payer: Medicare HMO | Admitting: Vascular Surgery

## 2023-07-19 ENCOUNTER — Ambulatory Visit: Payer: Medicare HMO | Admitting: Psychiatry

## 2023-07-19 DIAGNOSIS — F5089 Other specified eating disorder: Secondary | ICD-10-CM

## 2023-07-19 DIAGNOSIS — F411 Generalized anxiety disorder: Secondary | ICD-10-CM | POA: Diagnosis not present

## 2023-07-19 DIAGNOSIS — F3341 Major depressive disorder, recurrent, in partial remission: Secondary | ICD-10-CM

## 2023-07-19 DIAGNOSIS — Z636 Dependent relative needing care at home: Secondary | ICD-10-CM

## 2023-07-19 DIAGNOSIS — Z8659 Personal history of other mental and behavioral disorders: Secondary | ICD-10-CM

## 2023-07-19 DIAGNOSIS — Z634 Disappearance and death of family member: Secondary | ICD-10-CM
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
## 2023-07-28 ENCOUNTER — Ambulatory Visit (INDEPENDENT_AMBULATORY_CARE_PROVIDER_SITE_OTHER): Payer: Medicare HMO | Admitting: Psychiatry

## 2023-07-28 DIAGNOSIS — Z8659 Personal history of other mental and behavioral disorders: Secondary | ICD-10-CM | POA: Diagnosis not present

## 2023-07-28 DIAGNOSIS — F3341 Major depressive disorder, recurrent, in partial remission: Secondary | ICD-10-CM

## 2023-07-28 DIAGNOSIS — Z634 Disappearance and death of family member: Secondary | ICD-10-CM

## 2023-07-28 DIAGNOSIS — F411 Generalized anxiety disorder: Secondary | ICD-10-CM | POA: Diagnosis not present

## 2023-07-28 DIAGNOSIS — Z639 Problem related to primary support group, unspecified: Secondary | ICD-10-CM
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
## 2023-08-17 ENCOUNTER — Ambulatory Visit (INDEPENDENT_AMBULATORY_CARE_PROVIDER_SITE_OTHER): Payer: Medicare HMO | Admitting: Psychiatry

## 2023-08-17 DIAGNOSIS — Z634 Disappearance and death of family member: Secondary | ICD-10-CM

## 2023-08-17 DIAGNOSIS — F3341 Major depressive disorder, recurrent, in partial remission: Secondary | ICD-10-CM

## 2023-08-17 DIAGNOSIS — F411 Generalized anxiety disorder: Secondary | ICD-10-CM | POA: Diagnosis not present

## 2023-08-17 DIAGNOSIS — Z636 Dependent relative needing care at home: Secondary | ICD-10-CM

## 2023-08-17 DIAGNOSIS — Z639 Problem related to primary support group, unspecified: Secondary | ICD-10-CM

## 2023-08-17 DIAGNOSIS — Z8659 Personal history of other mental and behavioral disorders: Secondary | ICD-10-CM | POA: Diagnosis not present
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
## 2023-09-01 ENCOUNTER — Ambulatory Visit: Payer: Medicare HMO | Admitting: Psychiatry

## 2023-09-01 DIAGNOSIS — F411 Generalized anxiety disorder: Secondary | ICD-10-CM | POA: Diagnosis not present

## 2023-09-01 DIAGNOSIS — Z636 Dependent relative needing care at home: Secondary | ICD-10-CM

## 2023-09-01 DIAGNOSIS — Z639 Problem related to primary support group, unspecified: Secondary | ICD-10-CM

## 2023-09-01 DIAGNOSIS — F3341 Major depressive disorder, recurrent, in partial remission: Secondary | ICD-10-CM | POA: Diagnosis not present

## 2023-09-01 DIAGNOSIS — Z634 Disappearance and death of family member: Secondary | ICD-10-CM

## 2023-09-01 DIAGNOSIS — Z8659 Personal history of other mental and behavioral disorders: Secondary | ICD-10-CM | POA: Diagnosis not present
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
## 2023-09-12 ENCOUNTER — Ambulatory Visit: Payer: Medicare HMO | Admitting: Psychiatry

## 2023-09-12 DIAGNOSIS — Z639 Problem related to primary support group, unspecified: Secondary | ICD-10-CM

## 2023-09-12 DIAGNOSIS — Z634 Disappearance and death of family member: Secondary | ICD-10-CM

## 2023-09-12 DIAGNOSIS — F3341 Major depressive disorder, recurrent, in partial remission: Secondary | ICD-10-CM

## 2023-09-12 DIAGNOSIS — Z8659 Personal history of other mental and behavioral disorders: Secondary | ICD-10-CM

## 2023-09-12 DIAGNOSIS — F5089 Other specified eating disorder: Secondary | ICD-10-CM

## 2023-09-12 DIAGNOSIS — A310 Pulmonary mycobacterial infection: Secondary | ICD-10-CM | POA: Diagnosis not present
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
## 2023-09-28 ENCOUNTER — Ambulatory Visit: Payer: Medicare Other | Admitting: Psychiatry

## 2023-09-28 DIAGNOSIS — A310 Pulmonary mycobacterial infection: Secondary | ICD-10-CM | POA: Diagnosis not present

## 2023-09-28 DIAGNOSIS — Z636 Dependent relative needing care at home: Secondary | ICD-10-CM

## 2023-09-28 DIAGNOSIS — Z634 Disappearance and death of family member: Secondary | ICD-10-CM | POA: Diagnosis not present

## 2023-09-28 DIAGNOSIS — F5089 Other specified eating disorder: Secondary | ICD-10-CM | POA: Diagnosis not present

## 2023-09-28 DIAGNOSIS — Z8659 Personal history of other mental and behavioral disorders: Secondary | ICD-10-CM

## 2023-09-28 DIAGNOSIS — F3341 Major depressive disorder, recurrent, in partial remission: Secondary | ICD-10-CM

## 2023-09-28 NOTE — Progress Notes (Signed)
 Psychotherapy Progress Note [facility] [name], PhD LP  Patient ID: Asiyah Pineau)    MRN: [id number] Therapy format: Individual psychotherapy Date: 09/28/2023      Start: 2:13p     Stop: 3:16p     Time Spent: 63 min Location: Telehealth visit -- I connected with this patient by an approved telecommunication method (video), with her informed consent, and verifying identity and patient privacy.  I was located at my office and patient at her home.  As needed, we discussed the limitations, risks, and security and privacy concerns associated with telehealth service, including the availability and conditions which currently govern in-person appointments and the possibility that 3rd-party payment may not be fully guaranteed and she may be responsible for charges.  After she indicated understanding, we proceeded with the session.  Also discussed treatment planning, as needed, including ongoing verbal agreement with the plan, the opportunity to ask and answer all questions, her demonstrated understanding of instructions, and her readiness to call the office should symptoms worsen or she feels she is in a crisis state and needs more immediate and tangible assistance.   Session narrative (presenting needs, interim history, self-report of stressors and symptoms, applications of prior therapy, status changes, and interventions made in session) Managed to catch a sinus infection of some kind, plus searing eye pain she suspects is HSV or blepharitis.  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  [name] went through ear infx himself and a low-dose steroid treatment that affected blood sugar.  All told, exacerbated his cognitive state, as well as his limited hearing, which becomes super frustrating for her as he gets close quarters and she still has to yell.  Annoyed with him being obsessed with his insulin, amid dose and  insurance changes, and disrupted supply.  Alltold, he is getting irritable and scared, and acting more like he has dementia.  Prednisone -- and insulin interruption -- seems to be enough to break his usual, Effexor-supported happy-go-lucky way, and all the phone calls he makes are annoyingly loud.  Figures to get a whiteboard to communicate, instead of yellling and him only hearing half.  Recommended she use earplugs or headphones PRN to spare her nerves, and certainly endorsed using written communication PRN.  Validated how [name]'s behavior resembles [name] when he was an intensely ADHD child, and how it is depressing to see him be so inert other times.  Xmas sucked for all the illness and stuck in side together, plus feeling more acutely how she has no uncomplicated family left, with sisters and son estranged and [name] in his condition.  Does have an agreement to go look in on her [name]'s property this afternoon.  New Year's travel pictures from [place]  are encouraging, both for how she is doing widowed, and for how they are doing continuing as family.  Credit to niece Burnard for how she is supporting her mom, especially in contrast to her 2 siblings who estranged themselves from their father and staged an incident at his funeral.  Has expressed her pride in her and gratitude.    Side note, irked about staff here not letting her pay copays she knows are in process.  Allowed to vent, reminded how she could persuade a staff member to take a payment or just send a check if she wants to avoid the conversation.  Notable that she ate a bite of yogurt recently -- significant because she had  food poisoning in 2008 with yogurt (Danimals), hospitalized 4 days, and has never been able to stomach it since.  Only has it in the house for [place], post-antibiotic, and she has control over whether [place] contaminates it, so she took the chance with a spoonful (Oikos, Greek, unsweetened).  Affirmed the courage  used.  Otherwise, found herself having something of an outburst with her friend [name], normalized as the limit of   Therapeutic modalities: Cognitive Behavioral Therapy, Solution-Oriented/Positive Psychology, Ego-Supportive, and Assertiveness/Communication  Mental Status/Observations:  Appearance:   Casual     Behavior:  Appropriate  Motor:  Normal  Speech/Language:   Clear and Coherent  Affect:  Appropriate  Mood:  dysthymic  Thought process:  normal  Thought content:    WNL  Sensory/Perceptual disturbances:    WNL  Orientation:  Fully oriented  Attention:  Good    Concentration:  Good  Memory:  WNL  Insight:    Good  Judgment:   Good  Impulse Control:  Good   Risk Assessment: Danger to Self: No Self-injurious Behavior: No Danger to Others: No Physical Aggression / Violence: No Duty to Warn: No Access to Firearms a concern: No  Assessment of progress:  situational setback(s)  Diagnosis:   ICD-10-CM   1. Recurrent major depressive disorder, in partial remission (HCC)  F33.41     2. Bereavement  Z63.4     3. History of posttraumatic stress disorder (PTSD)  Z86.59     4. Mycobacterium avium complex (HCC)  A31.0     5. Orthorexia nervosa  F50.89     6. Caregiver stress  Z63.6      Plan:  Regrets/trauma hx OK to continue reminiscing, helpful to reach the good with the bad Remember, contextualize, forgive ad lib -- [name], parents, and herself Re bereavement and unfinished business with [name] self-affirm bereavement, good work done to reconcile, and constructive beliefs about [name] fate and God's care/concern, and continue tasks and needful communication with bereaved family.  Discretion about revealing to SIL that [name] in fear and probably misguided concern that he needed to spare her.  Options to talk or write to [facility] in absentia, pray, memorialize as she sees fit, reaffirm forgiveness posthumously. Phobias -- foodborne and airborne  illness Reframe the moment she feels like avoiding food as her golden opportunity to say yes and to be in control every bit a much as saying no Continue exposures to perceived risk of contamination she knows is minuscule or well-controlled Reduce arbitrary wait time for food, increase bites taken as a test experiment with broader and deeper intake Use imaginary [place] as coach when facing anxiety  Take food-related dreams as a sign of restoring normal appetite OK to use anorexic videos as inspiration to eat more normally instead  Try smelling spices and/or commitment to reopen a forgotten taste per week as ways to revive appetite rather than rely on moralizing with herself to do as she should Similarly, assess and relax restrictions and protocols on airborne illness, within reason Continue self-help through [name] website ad lib Physical health Evaluate as needed hearing loss Stretch liberally as able for orthopedic/spinal issues Observe reasonable standards for airborne infection control given her lung condition For sleep, continue to establish electronics curfew and willingness to turn in For weight maintenance, ensure good calorie density in food choices and sufficient quantity For health, ensure good variety, especially adequate vegetable and fruit nutrients, antioxidant content, and wheat OK Caregiver stress Continue  reframing [name]'s behavior sometimes as second childhood -- not the same as re-raising [name] or revisiting her put-upon childhood, but it does mean an imposed parenting role until it transitions to professional care Pursue reasonable collaborations with his treatment team for cognitive health Seek adequate time away from home and perpetual care -- may be opportunities safe enough, where [name] is capable of managing some time alone.  Look into respite care and facility and in-home care alternatives for [name] to get ahead on eventual developments. 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   Assert as needed with friend [name].  OK to be direct, keep it about behavior and its better/worse effects.  Try to maintain benevolent interpretation, resist equating her with [name] or other treacherous personalities.  Assume not knowing before not caring.  Continue willing to ask as needed if anything is amiss, to demonstrate safety to talk, actually. Family strife Tasks of bereavement as indicated Continue stoic approach to son [name] resentment/rejection.  Refrain from drastic moves to block, just trust she's strong enough to non-reply if what she gets is actually unfair or aggressive. Self-affirm she can legitimately love the child and fear/guard against the adult he became without it being sinful Other recommendations/advice -- As may be noted above.  Continue to utilize previously learned skills ad lib. Medication compliance -- Maintain medication as prescribed and work faithfully with relevant prescriber(s) if any changes are desired or seem indicated. Crisis service -- Aware of call list and work-in appts.  Call the clinic on-call service, 988/hotline, 911, or present to [facility] or ER if any life-threatening psychiatric crisis. Followup -- Return for time as already scheduled.  Next scheduled visit with me 10/20/2023.  Next scheduled in this office 10/20/2023.  Lamar Kendall, PhD [name], PhD LP Clinical Psychologist, [facility] [address], [address] [place], [place]  [address] [phone]

## 2023-10-04 ENCOUNTER — Telehealth: Payer: Self-pay

## 2023-10-04 NOTE — Telephone Encounter (Signed)
 Prior Authorization Alprazolam 0.5 mg [employer]  Approved Effective thru 09/27/24

## 2023-10-12 ENCOUNTER — Telehealth: Payer: Self-pay

## 2023-10-12 NOTE — Telephone Encounter (Signed)
Faxed documentation and completed form for [employer] per request for a Tier Exception on Alprazolam 0.5 mg.

## 2023-10-18 ENCOUNTER — Telehealth: Payer: Self-pay

## 2023-10-20 ENCOUNTER — Ambulatory Visit (INDEPENDENT_AMBULATORY_CARE_PROVIDER_SITE_OTHER): Payer: Medicare Other | Admitting: Psychiatry

## 2023-10-20 DIAGNOSIS — Z8659 Personal history of other mental and behavioral disorders: Secondary | ICD-10-CM

## 2023-10-20 DIAGNOSIS — Z634 Disappearance and death of family member: Secondary | ICD-10-CM

## 2023-10-20 DIAGNOSIS — Z639 Problem related to primary support group, unspecified: Secondary | ICD-10-CM

## 2023-10-20 DIAGNOSIS — F422 Mixed obsessional thoughts and acts: Secondary | ICD-10-CM | POA: Diagnosis not present

## 2023-10-20 DIAGNOSIS — Z636 Dependent relative needing care at home: Secondary | ICD-10-CM

## 2023-10-20 DIAGNOSIS — F3341 Major depressive disorder, recurrent, in partial remission: Secondary | ICD-10-CM

## 2023-10-20 DIAGNOSIS — A310 Pulmonary mycobacterial infection: Secondary | ICD-10-CM

## 2023-10-20 DIAGNOSIS — Z9189 Other specified personal risk factors, not elsewhere classified: Secondary | ICD-10-CM

## 2023-10-20 DIAGNOSIS — Z630 Problems in relationship with spouse or partner: Secondary | ICD-10-CM

## 2023-10-20 DIAGNOSIS — F5089 Other specified eating disorder: Secondary | ICD-10-CM | POA: Diagnosis not present

## 2023-10-20 NOTE — Progress Notes (Unsigned)
Psychotherapy Progress Note [facility] [name], PhD LP  Patient ID: Nica Friske)    MRN: [id number] Therapy format: Individual psychotherapy Date: 10/20/2023      Start: 4:13p     Stop: 5:25p     Time Spent: 72 min Location: Telehealth visit -- I connected with this patient by an approved telecommunication method (video), with her informed consent, and verifying identity and patient privacy.  I was located at my office and patient at her home.  As needed, we discussed the limitations, risks, and security and privacy concerns associated with telehealth service, including the availability and conditions which currently govern in-person appointments and the possibility that 3rd-party payment may not be fully guaranteed and she may be responsible for charges.  After she indicated understanding, we proceeded with the session.  Also discussed treatment planning, as needed, including ongoing verbal agreement with the plan, the opportunity to ask and answer all questions, her demonstrated understanding of instructions, and her readiness to call the office should symptoms worsen or she feels she is in a crisis state and needs more immediate and tangible assistance.   Session narrative (presenting needs, interim history, self-report of stressors and symptoms, applications of prior therapy, status changes, and interventions made in session) Aggravations with her email facility, which moves read messages out of sight.  [name] had been more irritable in recent months, est 3.  Best understanding it is progression of dementia, and his natural defensiveness taking guidance and corrections.  Hard enough with history of passive-aggressive behavior, e.g., mockingly repeating every word she says when she wanted him to check instructions.  Admittedly loses her patience sometimes and snaps at him.  He's also become more impatient for her time and help.  Wednesday, on her birthday, was  tempted to give herself a hotel room for the night.  The dementia support program through [facility] is restructuring, meaning she won't have the same access to her own support.  Discussed possibilities for respite care, and what measures she would see needed Psychiatrist, fend for himself, facility).  Would be most concerned for his judgment with money.  Contending with paranoia that she and the [facility] counselor were plotting against him.  Suggested she can for moments like that ask him if he knows her better than that.  Difficult to take that up, with a variety of irritating adolescent behaviors going on.  Tends to see passive-aggressive motives on his part, which are plausible, and being semi-exhausted from all of her roles taking care of [place] and home.  Came to a conversation about whether to live together or not, actually.  By her reasoning, it would actually be better to live in a duplex.    Simultaneously frustrated with [name] duties and difficulty getting responses and straight answers from people she needs to hear from to get her duties fulfilled.  Endorsed, if needed, to just do her job to the best circumstances allow, and let it come back to her if things need to be repaired, rerouted, or reedited.  Can also let herself be taken up for several hours at a clip supporting fellow workers in the organization and still figure people should get the hint if she's spent or on the burner for something else.  Encouraged be willing to repeat herself or be explicit when she doesn't actually have the time or energy.  Apprehension, too, about a very public speaking role in August.  And affected ongoing by [name] death.  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  Might see some withdrawing by SIL, too, but able to figure it is temporary, her natural solitary reaction to when things hurt more.  Acknowledged it is lonely, on an abiding basis.  One idea to go to see a  pen pal in [place] this summer.  Endorsed the possibility she could make the trip on her own if [name] cannot abide it, or handle it, or involve a friend.    Bottom line encouraged to allow herself to set limits on draining behaviors and arrange for respite with [name], particularly since she is admittedly coming to resent him.  Psoriasis flaring as well.    Therapeutic modalities: {AM:23362::"Cognitive Behavioral Therapy","Solution-Oriented/Positive Psychology"}  Mental Status/Observations:  Appearance:   {PSY:22683}     Behavior:  {PSY:21022743}  Motor:  {PSY:22302}  Speech/Language:   {PSY:22685}  Affect:  {PSY:22687}  Mood:  {PSY:31886}  Thought process:  {PSY:31888}  Thought content:    {PSY:[phone]}  Sensory/Perceptual disturbances:    {[phone]}  Orientation:  {Psych Orientation:23301::"Fully oriented"}  Attention:  {Good-Fair-Poor ratings:23770::"Good"}    Concentration:  {Good-Fair-Poor ratings:23770::"Good"}  Memory:  {PSY:[phone]}  Insight:    {Good-Fair-Poor ratings:23770::"Good"}  Judgment:   {Good-Fair-Poor ratings:23770::"Good"}  Impulse Control:  {Good-Fair-Poor ratings:23770::"Good"}   Risk Assessment: Danger to Self: {Risk:22599::"No"} Self-injurious Behavior: {Risk:22599::"No"} Danger to Others: {Risk:22599::"No"} Physical Aggression / Violence: {Risk:22599::"No"} Duty to Warn: {AMYesNo:22526::"No"} Access to Firearms a concern: {AMYesNo:22526::"No"}  Assessment of progress:  {Progress:22147::"progressing"}  Diagnosis: No diagnosis found. Plan:  *** Other recommendations/advice -- As may be noted above.  Continue to utilize previously learned skills ad lib. Medication compliance -- Maintain medication as prescribed and work faithfully with relevant prescriber(s) if any changes are desired or seem indicated. Crisis service -- Aware of call list and work-in appts.  Call the clinic on-call service, 988/hotline, 911, or present to [facility] or ER if any  life-threatening psychiatric crisis. Followup -- No follow-ups on file.  Next scheduled visit with me 11/13/2023.  Next scheduled in this office 11/08/2023.  [name] Fries, PhD [name], PhD LP Clinical Psychologist, [facility] [address], [address] [place], [place] [address] [phone]

## 2023-11-08 ENCOUNTER — Encounter: Payer: Self-pay | Admitting: Physician Assistant

## 2023-11-08 ENCOUNTER — Telehealth: Payer: Medicare Other | Admitting: Physician Assistant

## 2023-11-08 DIAGNOSIS — F411 Generalized anxiety disorder: Secondary | ICD-10-CM

## 2023-11-08 DIAGNOSIS — Z634 Disappearance and death of family member: Secondary | ICD-10-CM | POA: Diagnosis not present

## 2023-11-08 DIAGNOSIS — Z636 Dependent relative needing care at home: Secondary | ICD-10-CM | POA: Diagnosis not present

## 2023-11-08 DIAGNOSIS — F3341 Major depressive disorder, recurrent, in partial remission: Secondary | ICD-10-CM | POA: Diagnosis not present

## 2023-11-08 MED ORDER — ALPRAZOLAM 0.5 MG PO TABS
ORAL_TABLET | ORAL | 5 refills | Status: DC
Start: 2023-11-08 — End: 2024-04-26

## 2023-11-08 NOTE — Progress Notes (Unsigned)
[facility] Med Check  [name],  MRN: [id number]  PCP: Barbette Reichmann, MD  Date of Evaluation: 11/08/2023 Time spent:30 minutes  Chief Complaint:   HISTORY/CURRENT STATUS: For routine med check.  Her brother died in 2024/07/23. They were very close so it's been hard. He had MDS,      Patient denies increased energy with decreased need for sleep, increased talkativeness, racing thoughts, impulsivity or risky behaviors, increased spending, increased libido, grandiosity, increased irritability or anger, paranoia, or hallucinations.  Denies dizziness, syncope, seizures, numbness, tingling, tremor, tics, unsteady gait, slurred speech, confusion. Denies muscle or joint pain, stiffness, or dystonia.  Individual Medical History/ Review of Systems: Changes? :No      Past medications for mental health diagnoses include: Zoloft, caused psychosis w/ hallucinations,  BuSpar, Ativan, Effexor (became psychotic) She was told to never take any kind of SSRI/SNRI again. Wellbutrin caused agitation.  Allergies: Albuterol, Cephalosporins, Flagyl [metronidazole], Macrobid [nitrofurantoin macrocrystal], Neomy-bacit-polymyx-pramoxine, Penicillins, Sulfa antibiotics, Vancomycin, Vicodin [hydrocodone-acetaminophen], Amoxicillin, Ciprofloxacin, Latex, Bacitracin, Bacitracin-neomycin-polymyxin, Barium-containing compounds, Nitrofurantoin, Tobramycin, Decongest-aid [pseudoephedrine], Eggshell membrane (chicken) [egg shells], Hydrocodone-acetaminophen, Levofloxacin, Other, Prednisone, and Zithromax [azithromycin]  Current Medications:  Current Outpatient Medications:    acetaminophen (TYLENOL) 500 MG tablet, Take 500 mg by mouth every 6 (six) hours as needed., Disp: , Rfl:    ALPRAZolam (XANAX) 0.5 MG tablet, TAKE 1.5 PILLS IN MORNING, 1.5 A PILLS AT LUNCH, 1 AT DINNER, AND 1.5 PILLS AT BEDTIME, ALL AS NEEDED, Disp: 165 tablet, Rfl: 5   Artificial Tear Ointment (REFRESH P.M. OP),  Place 1 application into both eyes at bedtime. Apply eye gel to both eyelids, Disp: , Rfl:    aspirin 81 MG chewable tablet, Chew 81 mg by mouth daily. (Patient not taking: Reported on 04/14/2022), Disp: , Rfl:    atenolol (TENORMIN) 25 MG tablet, Take 12.5 mg by mouth 2 (two) times daily. , Disp: , Rfl:    budesonide (PULMICORT) 180 MCG/ACT inhaler, Inhale 2 puffs into the lungs 2 (two) times daily. , Disp: , Rfl:    Cholecalciferol (VITAMIN D3) 10 MCG (400 UNIT) tablet, Take by mouth. (Patient not taking: Reported on 04/14/2022), Disp: , Rfl:    COVID-19 mRNA bivalent vaccine, Pfizer, (PFIZER COVID-19 VAC BIVALENT) injection, Inject into the muscle. (Patient not taking: Reported on 04/26/2022), Disp: 0.3 mL, Rfl: 0   COVID-19 mRNA vaccine 2023-2024 (COMIRNATY) syringe, Inject into the muscle. (Patient not taking: Reported on 03/28/2023), Disp: 0.3 mL, Rfl: 0   COVID-19 mRNA vaccine, Pfizer, (COMIRNATY) syringe, Inject 0.3 mLs into the muscle., Disp: 0.3 mL, Rfl: 0   dicyclomine (BENTYL) 20 MG tablet, Take 1 tablet (20 mg total) by mouth 2 (two) times daily., Disp: 20 tablet, Rfl: 0   fluticasone (FLONASE) 50 MCG/ACT nasal spray, Place 1 spray into both nostrils daily as needed for allergies., Disp: , Rfl: 6   gabapentin (NEURONTIN) 100 MG capsule, Take 100 mg by mouth at bedtime as needed. (Patient not taking: Reported on 04/14/2022), Disp: , Rfl:    ipratropium (ATROVENT HFA) 17 MCG/ACT inhaler, Inhale 2 puffs into the lungs 4 (four) times daily., Disp: , Rfl:    levalbuterol (XOPENEX HFA) 45 MCG/ACT inhaler, Inhale 2 puffs into the lungs every 4 (four) hours as needed for wheezing., Disp: , Rfl:    Multiple Vitamin (MULTIVITAMIN WITH MINERALS) TABS, Take 1 tablet by mouth daily., Disp: , Rfl:    mupirocin ointment (BACTROBAN) 2 %, APPLY TO AFFECTED AREA 3 TIMES A DAY, Disp: , Rfl:  pantoprazole (PROTONIX) 40 MG tablet, Take 40 mg by mouth daily as needed (GERD). For stomach, Disp: , Rfl:    Polyvinyl  Alcohol-Povidone (REFRESH OP), Place 1 drop into both eyes daily as needed (dry eyes)., Disp: , Rfl:    ZIRGAN 0.15 % GEL, APPLY 1 A SMALL AMOUNT IN AFFECTED EYE 5 TIMES A DAY UNTIL DIRECTED, Disp: , Rfl:  Medication Side Effects: none  Family Medical/ Social History: Changes? No  MENTAL HEALTH EXAM:  There were no vitals taken for this visit.There is no height or weight on file to calculate BMI.  General Appearance: Casual and Well Groomed  Eye Contact:  Good  Speech:  Clear and Coherent and Normal Rate  Volume:  Normal  Mood:  Euthymic  Affect:  Congruent  Thought Process:  Goal Directed and Descriptions of Associations: Intact  Orientation:  Full (Time, Place, and Person)  Thought Content: Logical   Suicidal Thoughts:  No  Homicidal Thoughts:  No  Memory:  WNL  Judgement:  Good   Insight:  Good  Psychomotor Activity:  Normal  Concentration:  Concentration: Good and Attention Span: Good  Recall:  Good  Fund of Knowledge: Good  Language: Good  Assets:  Communication Skills Desire for Improvement Financial Resources/Insurance Housing Resilience Transportation  ADL's:  Intact  Cognition: WNL  Prognosis:  Good   DIAGNOSES:  No diagnosis found.  Receiving Psychotherapy: Yes w/ Dr. [name]  RECOMMENDATIONS:  PDMP reviewed.  Last Xanax filled 11/01/2023.   Continue Xanax 0.5 mg, 1.5 pills every morning, 1.25 pills at lunch, 1 pill at dinner, 1.5 at bedtime as needed.  (I am leaving the prescription directions at the same so she will have enough meds if she needs to increase the dose. Continue all supplements. Continue psychotherapy with Dr. [name].  Return in 6 months.   Melony Overly, PA-C

## 2023-11-09 ENCOUNTER — Encounter: Payer: Self-pay | Admitting: Physician Assistant

## 2023-11-13 ENCOUNTER — Ambulatory Visit: Payer: Medicare Other | Admitting: Psychiatry

## 2023-11-13 DIAGNOSIS — Z8659 Personal history of other mental and behavioral disorders: Secondary | ICD-10-CM | POA: Diagnosis not present

## 2023-11-13 DIAGNOSIS — F5089 Other specified eating disorder: Secondary | ICD-10-CM

## 2023-11-13 DIAGNOSIS — Z634 Disappearance and death of family member: Secondary | ICD-10-CM

## 2023-11-13 DIAGNOSIS — Z636 Dependent relative needing care at home: Secondary | ICD-10-CM

## 2023-11-13 DIAGNOSIS — Z639 Problem related to primary support group, unspecified: Secondary | ICD-10-CM

## 2023-11-13 DIAGNOSIS — F3341 Major depressive disorder, recurrent, in partial remission: Secondary | ICD-10-CM

## 2023-11-13 DIAGNOSIS — A310 Pulmonary mycobacterial infection: Secondary | ICD-10-CM | POA: Diagnosis not present
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
## 2023-12-06 ENCOUNTER — Ambulatory Visit: Payer: Medicare Other | Admitting: Psychiatry

## 2023-12-06 DIAGNOSIS — Z634 Disappearance and death of family member: Secondary | ICD-10-CM

## 2023-12-06 DIAGNOSIS — A310 Pulmonary mycobacterial infection: Secondary | ICD-10-CM | POA: Diagnosis not present

## 2023-12-06 DIAGNOSIS — F330 Major depressive disorder, recurrent, mild: Secondary | ICD-10-CM | POA: Diagnosis not present

## 2023-12-06 DIAGNOSIS — Z639 Problem related to primary support group, unspecified: Secondary | ICD-10-CM

## 2023-12-06 DIAGNOSIS — Z636 Dependent relative needing care at home: Secondary | ICD-10-CM

## 2023-12-06 DIAGNOSIS — F5089 Other specified eating disorder: Secondary | ICD-10-CM

## 2023-12-06 DIAGNOSIS — F411 Generalized anxiety disorder: Secondary | ICD-10-CM | POA: Diagnosis not present

## 2023-12-06 DIAGNOSIS — Z889 Allergy status to unspecified drugs, medicaments and biological substances status: Secondary | ICD-10-CM

## 2023-12-06 NOTE — Progress Notes (Signed)
 Psychotherapy Progress Note [facility] Sandra [name], PhD LP  [name])    MRN: [id number] Therapy format: Individual psychotherapy Date: 12/06/2023      Start: 5:05p     Stop: 6:15p     Time Spent: 70 min Location: Telehealth visit -- I connected with this patient by an approved telecommunication method (video), with her informed consent, and verifying identity and patient privacy.  I was located at my office and patient at her home.  As needed, we discussed the limitations, risks, and security and privacy concerns associated with telehealth service, including the availability and conditions which currently govern in-person appointments and the possibility that 3rd-party payment may not be fully guaranteed and she may be responsible for charges.  After she indicated understanding, we proceeded with the session.  Also discussed treatment planning, as needed, including ongoing verbal agreement with the plan, the opportunity to ask and answer all questions, her demonstrated understanding of instructions, and her readiness to call the office should symptoms worsen or she feels she is in a crisis state and needs more immediate and tangible assistance.   Session narrative (presenting needs, interim history, self-report of stressors and symptoms, applications of prior therapy, status changes, and interventions made in session) Eyes flaring, possibly allergic reaction to pollen coming in in addition to standing autoimmune issues including eyelash mites.  Qualified for a grant to underwrite the very expensive drops she gets to treat.  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  Resisting going back on a biologic.  Defer to physician's judgment.  Frustrating with Sandra Burns' memory getting spottier, and him still defensive, more argumentative of late, as well  as becoming suddenly urgent to get some things done, like clean the A/C drain.  Irrationally generated rules for many things he has to do.  Not clear he is steady on his insulin since it changed, hitting some low blood sugars and undereating, as well as neglecting to balance calories.  More apathetic, too, and suspects he is failing to take medicine fully reliably.  Known Lewy body disease in addition to bona fide diabetes and identified atrophy.  Support/validation provided.   She is admittedly lonely herself with so much difficulty relating, and frequent cause to focus on working or coping with frustrations instead of just be able to hang out.  As friends go, repeated experience that none of her friends are much good at listening or giving room for her concerns.  SIL Sandra Burns pretty welcome exception to one-way interactions, continues to support her during estate process.  Probed whether the one-way nature of friendships may be an outgrowth of how easily and automatically she listens to others, perhaps subtly reinforcing others taking their turn.  By the same token, possible that she naturally attracts the needy, and that her settings for listening and empathizing are simply high on the bell curve, so it will seem like nobody else does "normally".  In any event, OK to ask for her turn, and persist.  Notes increasingly anxious dreaming of late, themes of high water (easily interpreted -- a lot of responsibilities to handle, threat of being overwhelmed) and fog (possibly the murkiness she has dealing with situations, possibly news of the world and government changes destabilizing economy and government  Re her own weight management and recovery from orthorexia weight loss, she has made it  back to 106#, 10 above her lowest point.  Sandra Burns a successful recovery since December 2023 low point.  Making vitamins regularly, and has become better able to eat regular portions.    Re. Sandra Burns, been more in touch lately, and  finding she's been more respectful and nonurgent.  Just still wary of her flipping without warning.  Suggested notice Sandra Burns's energy and urgency as a sign of her potential to get hasty and "flip".  If not there, practice trusting it's more Ok than that and see where it leads.  Again ran on with time, covering subsequent subjects in stress management and her response to issues in her living.  Agreed to collaborate more consciously to rein in time usage going forward.  Therapeutic modalities: Cognitive Behavioral Therapy, Solution-Oriented/Positive Psychology, and Ego-Supportive  Mental Status/Observations:  Appearance:   Casual     Behavior:  Appropriate  Motor:  Normal  Speech/Language:   Clear and Coherent  Affect:  Appropriate  Mood:  dysthymic  Thought process:  normal  Thought content:    WNL  Sensory/Perceptual disturbances:    WNL  Orientation:  Fully oriented  Attention:  Good    Concentration:  Good  Memory:  WNL  Insight:    Good  Judgment:   Good  Impulse Control:  Good   Risk Assessment: Danger to Self: No Self-injurious Behavior: No Danger to Others: No Physical Aggression / Violence: No Duty to Warn: No Access to Firearms a concern: No  Assessment of progress:  progressing  Diagnosis:   ICD-10-CM   1. Major depressive disorder, recurrent episode, mild with melancholic features (HCC)  F33.0     2. Caregiver stress  Z63.6     3. Bereavement  Z63.4     4. Relationship problem with friend  Z63.9     5. Generalized anxiety disorder (with hx panic, agoraphobia, and PTSD)  F41.1     6. Orthorexia nervosa  F50.89     7. Mycobacterium avium complex (HCC)  A31.0     8. Multiple allergies, with complex chronic pulmonary disease, and multiple immune illnesses  Z88.9      Plan:  Regrets/trauma hx OK to continue reminiscing, helpful to reach the good with the bad Remember, contextualize, forgive ad lib -- Sandra Burns, parents, and herself Re bereavement and  unfinished business with Sandra Burns -- self-affirm bereavement, good work done to reconcile, and constructive beliefs about Sandra Burns's fate and God's care/concern, and continue tasks and needful communication with bereaved family.  Discretion about revealing to SIL that Sandra Burns had been consulting her in fear and probably misguided concern that he needed to spare her.  Options to talk or write to [facility] in absentia, pray, memorialize as she sees fit, reaffirm forgiveness posthumously. Phobias -- foodborne and airborne illness Reframe the moment she feels like avoiding food as her golden opportunity to say "yes" and to be in control every bit a much as saying "no" Continue exposures to perceived risk of contamination she knows is minuscule or well-controlled Reduce arbitrary wait time for food, increase bites taken as a "test" experiment with broader and deeper intake Use imaginary [place] as "coach" when facing anxiety  Take food-related dreams as a sign of restoring normal appetite OK to use anorexic videos as inspiration to eat more normally instead  Try smelling spices and/or commitment to reopen a forgotten taste per week as ways to revive appetite rather than rely on moralizing with herself to do as she "should" Similarly, assess and relax restrictions  and protocols on airborne illness, within reason Continue self-help through [name] website ad lib Physical health Evaluate as needed hearing loss Stretch liberally as able for orthopedic/spinal issues Observe reasonable standards for airborne infection control given her lung condition For sleep, continue to establish electronics curfew and willingness to turn in For weight maintenance, ensure good calorie density in food choices and sufficient quantity For health, ensure good variety, especially adequate vegetable and fruit nutrients, antioxidant content, and wheat OK Caregiver stress Continue reframing [name]'s behavior sometimes as second childhood  -- not the same as re-raising [name] or revisiting her put-upon childhood, but it does mean an imposed parenting role until it transitions to professional care Pursue reasonable collaborations with his treatment team for cognitive health Seek adequate time away from home and perpetual care -- may be opportunities safe enough, where Sandra Burns is capable of managing some time alone.  Look into respite care and facility and in-home care alternatives for Sandra Burns to get ahead on eventual developments. 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   Assert as needed with friend Sandra Burns.  OK to be direct, keep it about behavior and its better/worse effects.  Try to maintain benevolent interpretation, resist equating her with [name] or other treacherous personalities.  Assume not knowing before not caring.  Continue willing to ask as needed if anything is amiss, to demonstrate safety to talk, actually. Family strife Tasks of bereavement as indicated Continue stoic approach to son [name] resentment/rejection.  Refrain from drastic moves to block, just trust she's strong enough to non-reply if what she gets is actually unfair or aggressive. Self-affirm she can legitimately love the child and fear/guard against the adult he became without it being sinful Other recommendations/advice -- As may be noted above.  Continue to utilize previously learned skills ad lib. Medication compliance -- Maintain medication as prescribed and work faithfully with relevant prescriber(s) if any changes are  desired or seem indicated. Crisis service -- Aware of call list and work-in appts.  Call the clinic on-call service, 988/hotline, 911, or present to [facility] or ER if any life-threatening psychiatric crisis. Followup -- Return for time as already scheduled.  Next scheduled visit with me 12/19/2023.  Next scheduled in this office 12/19/2023.  [name] Fries, PhD Sandra [name], PhD LP Clinical Psychologist, [facility] [address], [address] [place], [place] [address] [phone]

## 2023-12-19 ENCOUNTER — Ambulatory Visit: Payer: Medicare Other | Admitting: Psychiatry

## 2023-12-19 DIAGNOSIS — F330 Major depressive disorder, recurrent, mild: Secondary | ICD-10-CM

## 2023-12-19 DIAGNOSIS — A310 Pulmonary mycobacterial infection: Secondary | ICD-10-CM

## 2023-12-19 DIAGNOSIS — Z889 Allergy status to unspecified drugs, medicaments and biological substances status: Secondary | ICD-10-CM

## 2023-12-19 DIAGNOSIS — Z639 Problem related to primary support group, unspecified: Secondary | ICD-10-CM

## 2023-12-19 DIAGNOSIS — F411 Generalized anxiety disorder: Secondary | ICD-10-CM

## 2023-12-19 NOTE — Progress Notes (Signed)
 Psychotherapy Progress Note [facility] [name], PhD LP  Patient ID: Donesha Wallander)    MRN: [id number] Therapy format: Individual psychotherapy Date: 12/19/2023      Start: 3:12p     Stop: 4:16p     Time Spent: 64 min Location: Telehealth visit -- I connected with this patient by an approved telecommunication method (video), with her informed consent, and verifying identity and patient privacy.  I was located at my office and patient at her home.  As needed, we discussed the limitations, risks, and security and privacy concerns associated with telehealth service, including the availability and conditions which currently govern in-person appointments and the possibility that 3rd-party payment may not be fully guaranteed and she may be responsible for charges.  After she indicated understanding, we proceeded with the session.  Also discussed treatment planning, as needed, including ongoing verbal agreement with the plan, the opportunity to ask and answer all questions, her demonstrated understanding of instructions, and her readiness to call the office should symptoms worsen or she feels she is in a crisis state and needs more immediate and tangible assistance.   Session narrative (presenting needs, interim history, self-report of stressors and symptoms, applications of prior therapy, status changes, and interventions made in session) Busy typing when signed on, unable to hear me.  Text-prompted her phone but ignored.  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  Frustrated with people not doing the job, to the point where nonprofit status got revoked.  Working through understanding [employer] and trying to beat the clock for rebooting the organization from scratch.  Confirmed she does have the heart for the task, does not feel imposed on.    Says [name] has  been calling a lot lately, unsure why, feels ill at ease.  Says she has been irrationally energetic, even self-interrupting lately, seems somewhat manic, which automatically feels untrustworthy.  Noticed her committing air time to judgmental comments about others, and uncharacteristically posting about Native American issues and tagging Eyvette.  Aware she is on alert for another round of going outside of her lane and usurping Danessa's role.  Insight that the state conference is coming up in several weeks, figures [name] is trying to secure the friendship ahead of that time, basically to assuage her own insecurity.  Knows she is hyped up about what she perceives as ignorant lack of preparation for the nation's 250th birthday, and that she is generally trying too hard, including rambling, rhapsodic texts going from asking a question about [employer] to her fond wish to visit [facility] "and walk hand in hand..."  Knows she harbors animosity and mistrust for their regent, Trula Ore, and that she had another personality conflict with a [name] colleague, Bonita Quin, she allegedly tried to buttonhole into opposing [place].  Forecast a confrontation to come when [name] eventually hears that Julieana spoke with Bonita Quin.  Strategized assertive response.    Anxiety today about seeing pulmonologist tomorrow.  Having 3 months of increased difficulty with her lungs, needs to go, but uncertain about the public influenza risk right now.  Concluded it's time to "suck it up and go", since she has a nodule to monitor, and it's been 5 years since last in-person, and she is due for a new scan.  Commended to go despite the worry, use her mask, social distance, option to wait in her car  and be paged, and accept the remainder as opportunity to confront anxiety, in addition to treatment options  Therapeutic modalities: Cognitive Behavioral Therapy, Solution-Oriented/Positive Psychology, Ego-Supportive, Humanistic/Existential, and  Interpersonal  Mental Status/Observations:  Appearance:   Casual     Behavior:  Talkative/detailed  Motor:  Normal  Speech/Language:   Clear and Coherent  Affect:  Appropriate  Mood:  Less wearied  Thought process:  normal  Thought content:    WNL  Sensory/Perceptual disturbances:    WNL  Orientation:  Fully oriented  Attention:  Good    Concentration:  Good  Memory:  WNL  Insight:    Good  Judgment:   Good  Impulse Control:  Good   Risk Assessment: Danger to Self: No Self-injurious Behavior: No Danger to Others: No Physical Aggression / Violence: No Duty to Warn: No Access to Firearms a concern: No  Assessment of progress:  progressing  Diagnosis:   ICD-10-CM   1. Major depressive disorder, recurrent episode, mild with melancholic features (HCC)  F33.0     2. Generalized anxiety disorder (with hx panic, agoraphobia, and PTSD)  F41.1     3. Relationship problem with friend  Z63.9     4. Mycobacterium avium complex (HCC)  A31.0     5. Multiple allergies, with complex chronic pulmonary disease, and multiple immune illnesses and high risk of disabling infection  Z88.9      Plan:  Regrets/trauma hx OK to continue reminiscing, helpful to reach the good with the bad Remember, contextualize, forgive ad lib -- [name], parents, and herself Re bereavement and unfinished business with [name] -- self-affirm bereavement, good work done to reconcile, and constructive beliefs about [name] fate and God's care/concern, and continue tasks and needful communication with bereaved family.  Discretion about revealing to SIL that [name] had been consulting her in fear and probably misguided concern that he needed to spare her.  Options to talk or write to [facility] in absentia, pray, memorialize as she sees fit, reaffirm forgiveness posthumously. Phobias -- foodborne and airborne illness Reframe the moment she feels like avoiding food as her golden opportunity to say "yes" and to be in  control every bit a much as saying "no" Continue exposures to perceived risk of contamination she knows is minuscule or well-controlled Reduce arbitrary wait time for food, increase bites taken as a "test" experiment with broader and deeper intake Use imaginary [place] as "coach" when facing anxiety  Take food-related dreams as a sign of restoring normal appetite OK to use anorexic videos as inspiration to eat more normally instead  Try smelling spices and/or commitment to reopen a forgotten taste per week as ways to revive appetite rather than rely on moralizing with herself to do as she "should" Similarly, assess and relax restrictions and protocols on airborne illness, within reason Continue self-help through [name] website ad lib Physical health Evaluate as needed hearing loss Stretch liberally as able for orthopedic/spinal issues Observe reasonable standards for airborne infection control given her lung condition For sleep, continue to establish electronics curfew and willingness to turn in For weight maintenance, ensure good calorie density in food choices and sufficient quantity For health, ensure good variety, especially adequate vegetable and fruit nutrients, antioxidant content, and wheat OK Caregiver stress Continue reframing [name]'s behavior sometimes as second childhood -- not the same as re-raising [name] or revisiting her put-upon childhood, but it does mean an imposed parenting role until it transitions to professional care Pursue reasonable collaborations with his treatment team for  cognitive health Seek adequate time away from home and perpetual care -- may be opportunities safe enough, where [name] is capable of managing some time alone.  Look into respite care and facility and in-home care alternatives for [name] to get ahead on eventual developments. If interested, connect with dementia support organization(s) As long as his history of infidelity is not worth addressing,  then see forgiveness as merely a way to unburden herself of resentment Social support Work assertiveness with friends to get adequate time listened to, not just falling into the automatic listener role Continuing discretion disclosing to friends and asking support and her turn  Manage her work/service and her limits with [name] and other organizations.   Assert as needed with friend [name].  OK to be direct, keep it about behavior and its better/worse effects.  Try to maintain benevolent interpretation, resist equating her with [name] or other treacherous personalities.  Assume not knowing before not caring.  Continue willing to ask as needed if anything is amiss, to demonstrate safety to talk, actually. Family strife Tasks of bereavement as indicated Continue stoic approach to son [name] resentment/rejection.  Refrain from drastic moves to block, just trust she's strong enough to non-reply if what she gets is actually unfair or aggressive. Self-affirm she can legitimately love the child and fear/guard against the adult he became without it being sinful Other recommendations/advice -- As may be noted above.  Continue to utilize previously learned skills ad lib. Medication compliance -- Maintain medication as prescribed and work faithfully with relevant prescriber(s) if any changes are desired or seem indicated. Crisis service -- Aware of call list and work-in appts.  Call the clinic on-call service, 988/hotline, 911, or present to [facility] or ER if any life-threatening psychiatric crisis. Followup -- Return for time as already scheduled.  Next scheduled visit with me 01/03/2024.  Next scheduled in this office 01/03/2024.  [name] Fries, PhD [name], PhD LP Clinical Psychologist, [facility] [address] [place], [place] [address] [phone]

## 2023-12-20 ENCOUNTER — Ambulatory Visit: Payer: Medicare Other | Admitting: Psychiatry

## 2023-12-29 ENCOUNTER — Other Ambulatory Visit: Payer: Self-pay | Admitting: Internal Medicine

## 2023-12-29 DIAGNOSIS — R911 Solitary pulmonary nodule: Secondary | ICD-10-CM

## 2024-01-03 ENCOUNTER — Ambulatory Visit (INDEPENDENT_AMBULATORY_CARE_PROVIDER_SITE_OTHER): Payer: Medicare Other | Admitting: Psychiatry

## 2024-01-03 ENCOUNTER — Other Ambulatory Visit: Payer: Self-pay | Admitting: Internal Medicine

## 2024-01-03 DIAGNOSIS — R0789 Other chest pain: Secondary | ICD-10-CM

## 2024-01-03 DIAGNOSIS — F330 Major depressive disorder, recurrent, mild: Secondary | ICD-10-CM | POA: Diagnosis not present

## 2024-01-03 DIAGNOSIS — Z8659 Personal history of other mental and behavioral disorders: Secondary | ICD-10-CM

## 2024-01-03 DIAGNOSIS — A310 Pulmonary mycobacterial infection: Secondary | ICD-10-CM

## 2024-01-03 DIAGNOSIS — F411 Generalized anxiety disorder: Secondary | ICD-10-CM | POA: Diagnosis not present

## 2024-01-03 DIAGNOSIS — Z636 Dependent relative needing care at home: Secondary | ICD-10-CM | POA: Diagnosis not present

## 2024-01-03 DIAGNOSIS — I341 Nonrheumatic mitral (valve) prolapse: Secondary | ICD-10-CM

## 2024-01-03 DIAGNOSIS — F5089 Other specified eating disorder: Secondary | ICD-10-CM

## 2024-01-03 NOTE — Progress Notes (Signed)
 Psychotherapy Progress Note [facility] Delora Ferry, PhD LP  [name])    MRN: [id number] Therapy format: Individual psychotherapy Date: 01/03/2024      Start: 2:14p     Stop: 3:19p     Time Spent: 65 min Location: Telehealth visit -- I connected with this patient by an approved telecommunication method (video), with her informed consent, and verifying identity and patient privacy.  I was located at my office and patient at her home.  As needed, we discussed the limitations, risks, and security and privacy concerns associated with telehealth service, including the availability and conditions which currently govern in-person appointments and the possibility that 3rd-party payment may not be fully guaranteed and she may be responsible for charges.  After she indicated understanding, we proceeded with the session.  Also discussed treatment planning, as needed, including ongoing verbal agreement with the plan, the opportunity to ask and answer all questions, her demonstrated understanding of instructions, and her readiness to call the office should symptoms worsen or she feels she is in a crisis state and needs more immediate and tangible assistance.   Session narrative (presenting needs, interim history, self-report of stressors and symptoms, applications of prior therapy, status changes, and interventions made in session) Just off a call with Sandra Burns, allowed to expound at some length.  Apparently some intrigue afoot about the [facility] treasurer stepping down.  Clear in Sandra Burns's understanding that it's a combination of lacking competence and pressure from home to reduce her commitment, not able to share with Sandra Burns, but able to allay fears of Sandra Burns's about leader politics and her own tendency to come up with a mistrust narrative.  Some sign of hope in how Sandra Burns has changed her tone, and speaks more freely of her own therapy sessions here.  Opportunity taken to  validate her for what a strong job she is doing providing for her grandson.  Affirmed and encouraged in helping her off her self-doubt, taking her calls, and fostering a recovery in relationship, even if her mistrust brain has been triggered as it has.  Encouraged in continuing to rehabilitate the relationship and deescalate from her former mistrust and cynicism.    Medically complicated right now.  Been dealing with an infected eyelid of late, got very swollen and oozing.  Complication of dry eye from immune damage blocking glands and poorly controlled staph.  Making the rounds of several doctors of late, including tomorrow CT and eval whether she will need lung surgery.  Will be getting a calcium CT as well, looking forward to getting it clearer whether she is safer from the family curse of atherosclerosis.  Very supportive listening from cardiologist, who inquired about her stress and heard out about Sandra Burns' dementia, Sandra Burns's estrangement, 2 sisters' estrangement, and Sandra Burns's demise.  Known aortic atherosclerosis, and will be having major arteries ultrasounded.  OK with the possibility of finding need for a stent, and will be valuable to have a clear picture of cardiopulmonary conditions if she needs surgery.  Confident she is in good hands.  And good to have made the turn from deferring complex care to getting on with it.  Mammogram deferred 6 yrs, between pandemic and Sandra Burns' downturn.  Notes she has DDD in addition to the other known conditions.  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  Maintaining 2500-3000 cal a day now.  Gratified to gain weight like she has needed to, surprised to have to buy new pants.  Offered to consider Remeron as a treatment for inhibited appetite and stress/depression effects.  After a lot of memory jogging, recalled trying it under care of Sandra Eagle, MD, whom she  mistrusted, but no clear problem with the med, only noted in EHR 3 years ago that it was tried remotely w/o success.  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  Agreed to approach the idea again, trusting that whatever her reaction before was, it was reaction to the provider, not the medication.    Re. Sandra Burns, had felt it would be tragic and alarming if she died, then realized God -- and her friend -- will take care of him if it comes to it.  Affirmed and encouraged.  Therapeutic modalities: Cognitive Behavioral Therapy, Solution-Oriented/Positive Psychology, and Ego-Supportive  Mental Status/Observations:  Appearance:   Casual     Behavior:  Appropriate  Motor:  Normal  Speech/Language:   Clear and Coherent  Affect:  Appropriate  Mood:  Some fatigue  Thought process:  normal  Thought content:    WNL  Sensory/Perceptual disturbances:    WNL  Orientation:  Fully oriented  Attention:  Good    Concentration:  Fair  Memory:  WNL  Insight:    Good  Judgment:   Good  Impulse Control:  Good   Risk Assessment: Danger to Self: No Self-injurious Behavior: No Danger to Others: No Physical Aggression / Violence: No Duty to Warn: No Access to Firearms a concern: No  Assessment of progress:  progressing  Diagnosis:   ICD-10-CM   1. Major depressive disorder, recurrent episode, mild with melancholic features (HCC)  F33.0     2. Generalized anxiety disorder (with hx panic, agoraphobia, and PTSD)  F41.1     3. Caregiver stress  Z63.6     4. Mycobacterium avium complex (HCC)  A31.0     5. Orthorexia nervosa  F50.89    greatly improved    6. History of posttraumatic stress disorder (PTSD)  Z86.59      Plan:  Regrets/trauma hx OK to continue reminiscing, helpful to reach the good with the bad Remember, contextualize, forgive ad lib -- Sandra Burns, parents, and herself Re bereavement and unfinished  business with Sandra Burns -- self-affirm bereavement, good work done to reconcile, and constructive beliefs about Sandra Burns's fate and God's care/concern, and continue tasks and needful communication with bereaved family.  Discretion about revealing to SIL that Sandra Burns had been consulting her in fear and probably misguided concern that he needed to spare her.  Options to talk or write to [facility] in absentia, pray, memorialize as she sees fit, reaffirm forgiveness posthumously. Phobias -- foodborne and airborne illness Reframe the moment she feels like avoiding food as her golden opportunity to say "yes" and to be in control every bit a much as saying "no" Continue exposures to perceived risk of contamination she knows is minuscule or well-controlled Reduce arbitrary wait time for food, increase bites taken as a "test" experiment with broader and deeper intake Use imaginary [place] as "coach" when facing anxiety  Take food-related dreams as a sign of restoring normal appetite OK to use anorexic videos as inspiration to eat more normally instead  Try smelling spices and/or commitment to reopen a forgotten taste per week as ways to revive appetite rather than rely on moralizing with herself to  do as she "should" Similarly, assess and relax restrictions and protocols on airborne illness, within reason Continue self-help through [name] website ad lib Physical health Evaluate as needed hearing loss Stretch liberally as able for orthopedic/spinal issues Observe reasonable standards for airborne infection control given her lung condition For sleep, continue to establish electronics curfew and willingness to turn in For weight maintenance, ensure good calorie density in food choices and sufficient quantity For health, ensure good variety, especially adequate vegetable and fruit nutrients, antioxidant content, and wheat OK Caregiver stress Continue reframing [name]'s behavior sometimes as second childhood -- not the  same as re-raising Sandra Burns or revisiting her put-upon childhood, but it does mean an imposed parenting role until it transitions to professional care Pursue reasonable collaborations with his treatment team for cognitive health Seek adequate time away from home and perpetual care -- may be opportunities safe enough, where Sandra Burns is capable of managing some time alone.  Look into respite care and facility and in-home care alternatives for Sandra Burns to get ahead on eventual developments. 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   Assert as needed with friend Sandra Burns.  OK to be direct, keep it about behavior and its better/worse effects.  Try to maintain benevolent interpretation, resist equating her with Sandra Burns or other treacherous personalities.  Assume not knowing before not caring.  Continue willing to ask as needed if anything is amiss, to demonstrate safety to talk, actually. Family strife Tasks of bereavement as indicated Continue stoic approach to son Sandra Burns's resentment/rejection.  Refrain from drastic moves to block, just trust she's strong enough to non-reply if what she gets is actually unfair or aggressive. Self-affirm she can legitimately love the child and fear/guard against the adult he became without it being sinful Other recommendations/advice -- As may be noted above.  Continue to utilize previously learned skills ad lib. Medication compliance -- Maintain medication as prescribed and work faithfully with relevant prescriber(s) if any changes are desired or seem  indicated. Crisis service -- Aware of call list and work-in appts.  Call the clinic on-call service, 988/hotline, 911, or present to [facility] or ER if any life-threatening psychiatric crisis. Followup -- No follow-ups on file.  Next scheduled visit with me 01/18/2024.  Next scheduled in this office 01/18/2024.  Maretta Shaper, PhD Delora Ferry, PhD LP Clinical Psychologist, [facility] [address] [place], [place] [address] [phone]

## 2024-01-04 ENCOUNTER — Ambulatory Visit
Admission: RE | Admit: 2024-01-04 | Discharge: 2024-01-04 | Disposition: A | Discharge status: Home / Self Care | Payer: Self-pay | Source: Ambulatory Visit | Attending: Internal Medicine | Admitting: Internal Medicine

## 2024-01-04 ENCOUNTER — Other Ambulatory Visit

## 2024-01-04 DIAGNOSIS — R911 Solitary pulmonary nodule: Secondary | ICD-10-CM | POA: Insufficient documentation

## 2024-01-04 DIAGNOSIS — I341 Nonrheumatic mitral (valve) prolapse: Secondary | ICD-10-CM | POA: Insufficient documentation

## 2024-01-04 DIAGNOSIS — R0789 Other chest pain: Secondary | ICD-10-CM | POA: Insufficient documentation

## 2024-01-17 ENCOUNTER — Other Ambulatory Visit: Payer: Self-pay | Admitting: Internal Medicine

## 2024-01-17 DIAGNOSIS — Z1231 Encounter for screening mammogram for malignant neoplasm of breast: Secondary | ICD-10-CM

## 2024-01-18 ENCOUNTER — Other Ambulatory Visit

## 2024-01-18 ENCOUNTER — Ambulatory Visit (INDEPENDENT_AMBULATORY_CARE_PROVIDER_SITE_OTHER): Payer: Medicare Other | Admitting: Psychiatry

## 2024-01-18 DIAGNOSIS — A310 Pulmonary mycobacterial infection: Secondary | ICD-10-CM

## 2024-01-18 DIAGNOSIS — F411 Generalized anxiety disorder: Secondary | ICD-10-CM | POA: Diagnosis not present

## 2024-01-18 DIAGNOSIS — F5089 Other specified eating disorder: Secondary | ICD-10-CM

## 2024-01-18 DIAGNOSIS — F330 Major depressive disorder, recurrent, mild: Secondary | ICD-10-CM | POA: Diagnosis not present

## 2024-01-18 DIAGNOSIS — Z8659 Personal history of other mental and behavioral disorders: Secondary | ICD-10-CM | POA: Diagnosis not present

## 2024-01-18 DIAGNOSIS — Z634 Disappearance and death of family member: Secondary | ICD-10-CM

## 2024-01-18 DIAGNOSIS — Z636 Dependent relative needing care at home: Secondary | ICD-10-CM
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
## 2024-01-23 ENCOUNTER — Telehealth: Payer: Self-pay

## 2024-01-23 NOTE — Telephone Encounter (Signed)
 Laronica LVM requesting an appointment with Dr. Ernie Heal. I LVM requesting a call back. Based on the last visit date, patient will need a new referral; per referral coordinator.

## 2024-01-24 NOTE — Telephone Encounter (Signed)
 No further review needed

## 2024-01-30 ENCOUNTER — Other Ambulatory Visit

## 2024-02-13 ENCOUNTER — Encounter (INDEPENDENT_AMBULATORY_CARE_PROVIDER_SITE_OTHER): Payer: Self-pay

## 2024-02-13 ENCOUNTER — Ambulatory Visit (INDEPENDENT_AMBULATORY_CARE_PROVIDER_SITE_OTHER): Admitting: Psychiatry

## 2024-02-13 DIAGNOSIS — Z8659 Personal history of other mental and behavioral disorders: Secondary | ICD-10-CM

## 2024-02-13 DIAGNOSIS — Z636 Dependent relative needing care at home: Secondary | ICD-10-CM

## 2024-02-13 DIAGNOSIS — F411 Generalized anxiety disorder: Secondary | ICD-10-CM

## 2024-02-13 DIAGNOSIS — A310 Pulmonary mycobacterial infection: Secondary | ICD-10-CM

## 2024-02-13 DIAGNOSIS — F330 Major depressive disorder, recurrent, mild: Secondary | ICD-10-CM
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
## 2024-02-20 ENCOUNTER — Ambulatory Visit: Admitting: Infectious Disease

## 2024-02-21 ENCOUNTER — Other Ambulatory Visit: Payer: Self-pay

## 2024-02-21 ENCOUNTER — Ambulatory Visit: Admitting: Infectious Disease

## 2024-02-21 ENCOUNTER — Encounter: Payer: Self-pay | Admitting: Infectious Disease

## 2024-02-21 VITALS — BP 133/79 | HR 64 | Wt 110.0 lb

## 2024-02-21 DIAGNOSIS — L4050 Arthropathic psoriasis, unspecified: Secondary | ICD-10-CM

## 2024-02-21 DIAGNOSIS — B4481 Allergic bronchopulmonary aspergillosis: Secondary | ICD-10-CM | POA: Diagnosis not present

## 2024-02-21 DIAGNOSIS — Z2239 Carrier of other specified bacterial diseases: Secondary | ICD-10-CM | POA: Diagnosis not present

## 2024-02-21 DIAGNOSIS — J479 Bronchiectasis, uncomplicated: Secondary | ICD-10-CM

## 2024-02-21 DIAGNOSIS — Z7185 Encounter for immunization safety counseling: Secondary | ICD-10-CM

## 2024-02-21 HISTORY — DX: Carrier of other specified bacterial diseases: Z22.39

## 2024-02-21 NOTE — Progress Notes (Addendum)
 Subjective:  Chief complaint: follow-up for MYCOBACTERIUM Avium   [name], female    DOB: July 12, 1956, 68 y.o.   MRN: [id number]  HPI  Discussed the use of AI scribe software for clinical note transcription with the patient, who gave verbal consent to proceed.  History of Present Illness   Sandra Burns is a 68 year old female with bronchiectasis and chronic MAC infection who presents with worsening shortness of breath and cough. She was referred by Dr. Murray for evaluation of her chronic MAC infection and recent CT scan findings.  She has experienced worsening shortness of breath over the past nine months, particularly during light activities, along with increased coughing, night sweats, and chills. A recent CT scan showed concerning infiltrates compared to previous scans.   Formal read from April 10th, 2025:   MPRESSION: *Ill-defined reticular and nodular infiltrates of the right upper lobe with a distinct nodule of 3.5 mm. *Infiltrates with bronchial dilatation involving the anterior segment of the right upper lobe with associated atelectasis. *Similar infiltrates and bronchial dilatation bronchiectatic changes of the lingula noted to doses in the anterior segment of the right upper lobe. *2.5 mm nodule of the left lower lobe posteromedial segment. *4 mm subpleural nodule lateral segment of the right middle lobe. *Multiple pulmonary nodules. Most significant: Right solid pulmonary nodule within the upper lobe measuring 4 mm. Lung-RADS 2, benign appearance or behavior. Continue annual screening with low-dose chest CT without contrast in 12 months.     She uses airway clearance techniques, including an Aerobika device and 3% hypertonic saline. She has experienced gastrointestinal side effects with azithromycin and thinks she is allergic to ethambutol due to a related drug she is allergic to, but I know of know drug related to [facility]. . She is  cautious about rifampin due to potential drug interactions.  Her oxygen saturation levels occasionally drop to 93% but typically remain above 90%, with no consistent need for supplemental oxygen.  She is coughing more but not a terrible amount.       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   Anxiety    Arrhythmia    Arthritis    Asthma    Back pain    Bronchiectasis (HCC) 09/04/2019   Chronic obstructive airway disease (HCC)    Depression    Diverticulosis    Fibromyalgia    Fibromyalgia    GERD (gastroesophageal reflux disease)    Heart murmur    Irritable bowel syndrome    Kidney stone    Lupus    suspected   Migraine    Mitral valve prolapse    Multiple allergies 09/04/2019   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   Mycobacterium avium complex colonization 02/21/2024   Osteoporosis    Paroxysmal A-fib (HCC)    Psoriatic arthritis (HCC)    Raynaud's disease    Reactive airway disease    S/P chemotherapy, time since greater than 12 weeks 1984   cervical ca   Seizure (HCC)    Sjogren's syndrome (HCC)    Vaccine counseling 12/09/2020   Weight loss 11/10/2021    Past Surgical History:  Procedure Laterality Date   ABDOMINAL HYSTERECTOMY     BACK SURGERY     BREAST BIOPSY Left 20+ YRS AGO   EXCISIONAL - NEG   CHOLECYSTECTOMY     ESOPHAGOGASTRODUODENOSCOPY N/A 11/11/2021   Procedure: ESOPHAGOGASTRODUODENOSCOPY (EGD);  Surgeon: Onita,  Elspeth Sharper, DO;  Location: [facility] ENDOSCOPY;  Service: Gastroenterology;  Laterality: N/A;   OOPHORECTOMY     TUBAL LIGATION      Family History  Problem Relation Age of Onset   Lupus Mother    Breast cancer Neg Hx       Social History   Socioeconomic History   Marital status: Married    Spouse name: Not on file   Number of children: Not on file   Years of education: Not on file   Highest education level: Not on file   Occupational History   Occupation: RN    Comment: Disabled since '09 Cardiopulmonary reasons  Tobacco Use   Smoking status: Former    Current packs/day: 0.00    Types: Cigarettes    Quit date: 07/13/1981    Years since quitting: 42.6    Passive exposure: Past   Smokeless tobacco: Never  Vaping Use   Vaping status: Never Used  Substance and Sexual Activity   Alcohol use: Not Currently    Comment: drank as a teen.  Not since 1990   Drug use: Not Currently    Comment: pot as a teen   Sexual activity: Yes  Other Topics Concern   Not on file  Social History Narrative   Not on file   Social Drivers of Health   Financial Resource Strain: Low Risk  (02/13/2024)   Received from [facility]   Overall Financial Resource Strain (CARDIA)    Difficulty of Paying Living Expenses: Not hard at all  Food Insecurity: No Food Insecurity (02/13/2024)   Received from [facility]   Hunger Vital Sign    Worried About Running Out of Food in the Last Year: Never true    Ran Out of Food in the Last Year: Never true  Transportation Needs: No Transportation Needs (02/13/2024)   Received from [facility] - Transportation    In the past 12 months, has lack of transportation kept you from medical appointments or from getting medications?: No    Lack of Transportation (Non-Medical): No  Physical Activity: Insufficiently Active (09/29/2021)   Received from [facility]   Exercise Vital Sign    Days of Exercise per Week: 1 day    Minutes of Exercise per Session: 10 min  Stress: No Stress Concern Present (08/09/2023)   Received from [facility] - Occupational Stress Questionnaire    Feeling of Stress : Not at all  Social Connections: Unknown (02/06/2022)   Received from [facility]   Social Network    Social Network: Not on file    Allergies   Allergen Reactions   Albuterol Other (See Comments) and Palpitations    Atrial fibrillation Atrial fibrillation    Cephalosporins Itching and Rash    Rash and itching   Flagyl [Metronidazole] Dermatitis and Other (See Comments)    Other Reaction: SKIN SLOUGHING   Macrobid [Nitrofurantoin Macrocrystal] Itching   Neomy-Bacit-Polymyx-Pramoxine Itching and Swelling    Pt got the issue for the eye lids after instill the eyes drops.    Penicillins Anaphylaxis   Sulfa Antibiotics Itching   Vancomycin Shortness Of Breath    Other reaction(s): Unknown   Vicodin [Hydrocodone-Acetaminophen] Shortness Of Breath   Amoxicillin Itching and Swelling    She had intense itching and a rash see updated answers to questions below  Has patient had a  PCN reaction causing immediate rash, facial/tongue/throat swelling, SOB or lightheadedness with hypotension: No Has patient had a PCN reaction causing severe rash involving mucus membranes or skin necrosis: No Has patient had a PCN reaction that required hospitalization: No Has patient had a PCN reaction occurring within the last 10 years: No If all of the above answers are NO, then may proceed with Ceph   Ciprofloxacin Swelling    Arm swelled up bright red , rash, itching   Latex Rash   Bacitracin    Bacitracin-Neomycin-Polymyxin Itching and Swelling    Pt got the issue for the eye lids after instill the eyes drops.    Barium-Containing Compounds    Nitrofurantoin     Other reaction(s): Unknown   Tobramycin Itching and Swelling    Pt got the issue after instill the eyes drops   Decongest-Aid [Pseudoephedrine] Palpitations   Eggshell Membrane (Chicken) [Egg Shells] Rash   Hydrocodone-Acetaminophen Itching   Levofloxacin Rash    Itchy, swelling   Other Palpitations    BARIUM CONTRAST   Prednisone Palpitations   Zithromax [Azithromycin] Other (See Comments)    GI upset, C-diff     Current Outpatient Medications:    acetaminophen (TYLENOL)  500 MG tablet, Take 500 mg by mouth every 6 (six) hours as needed., Disp: , Rfl:    ALPRAZolam  (XANAX ) 0.5 MG tablet, TAKE 1.5 PILLS IN MORNING, 1.5 A PILLS AT LUNCH, 1 AT DINNER, AND 1.5 PILLS AT BEDTIME, ALL AS NEEDED, Disp: 165 tablet, Rfl: 5   Artificial Tear Ointment (REFRESH P.M. OP), Place 1 application into both eyes at bedtime. Apply eye gel to both eyelids, Disp: , Rfl:    aspirin 81 MG chewable tablet, Chew 81 mg by mouth daily. (Patient not taking: Reported on 04/14/2022), Disp: , Rfl:    atenolol (TENORMIN) 25 MG tablet, Take 12.5 mg by mouth 2 (two) times daily. , Disp: , Rfl:    budesonide (PULMICORT) 180 MCG/ACT inhaler, Inhale 2 puffs into the lungs 2 (two) times daily. , Disp: , Rfl:    Cholecalciferol (VITAMIN D3) 10 MCG (400 UNIT) tablet, Take by mouth. (Patient not taking: Reported on 04/14/2022), Disp: , Rfl:    COVID-19 mRNA bivalent vaccine, Pfizer, (PFIZER COVID-19 VAC BIVALENT) injection, Inject into the muscle. (Patient not taking: Reported on 04/26/2022), Disp: 0.3 mL, Rfl: 0   COVID-19 mRNA vaccine 2023-2024 (COMIRNATY ) syringe, Inject into the muscle. (Patient not taking: Reported on 03/28/2023), Disp: 0.3 mL, Rfl: 0   COVID-19 mRNA vaccine, Pfizer, (COMIRNATY ) syringe, Inject 0.3 mLs into the muscle., Disp: 0.3 mL, Rfl: 0   dicyclomine  (BENTYL ) 20 MG tablet, Take 1 tablet (20 mg total) by mouth 2 (two) times daily., Disp: 20 tablet, Rfl: 0   fluticasone (FLONASE) 50 MCG/ACT nasal spray, Place 1 spray into both nostrils daily as needed for allergies., Disp: , Rfl: 6   gabapentin (NEURONTIN) 100 MG capsule, Take 100 mg by mouth at bedtime as needed. (Patient not taking: Reported on 11/08/2023), Disp: , Rfl:    ipratropium (ATROVENT HFA) 17 MCG/ACT inhaler, Inhale 2 puffs into the lungs 4 (four) times daily., Disp: , Rfl:    levalbuterol (XOPENEX HFA) 45 MCG/ACT inhaler, Inhale 2 puffs into the lungs every 4 (four) hours as needed for wheezing., Disp: , Rfl:    Multiple Vitamin  (MULTIVITAMIN WITH MINERALS) TABS, Take 1 tablet by mouth daily., Disp: , Rfl:    mupirocin ointment (BACTROBAN) 2 %, APPLY TO AFFECTED AREA 3 TIMES A DAY, Disp: ,  Rfl:    pantoprazole (PROTONIX) 40 MG tablet, Take 40 mg by mouth daily as needed (GERD). For stomach, Disp: , Rfl:    Polyvinyl Alcohol-Povidone (REFRESH OP), Place 1 drop into both eyes daily as needed (dry eyes)., Disp: , Rfl:    ZIRGAN 0.15 % GEL, APPLY 1 A SMALL AMOUNT IN AFFECTED EYE 5 TIMES A DAY UNTIL DIRECTED, Disp: , Rfl:     Review of Systems  Constitutional:  Positive for diaphoresis and fatigue. Negative for activity change, appetite change, chills, fever and unexpected weight change.  HENT:  Negative for congestion, rhinorrhea, sinus pressure, sneezing, sore throat and trouble swallowing.   Eyes:  Negative for photophobia and visual disturbance.  Respiratory:  Positive for cough and shortness of breath. Negative for chest tightness, wheezing and stridor.   Cardiovascular:  Negative for chest pain, palpitations and leg swelling.  Gastrointestinal:  Negative for abdominal distention, abdominal pain, anal bleeding, blood in stool, constipation, diarrhea, nausea and vomiting.  Genitourinary:  Negative for difficulty urinating, dysuria, flank pain and hematuria.  Musculoskeletal:  Negative for arthralgias, back pain, gait problem, joint swelling and myalgias.  Skin:  Negative for color change, pallor, rash and wound.  Neurological:  Negative for dizziness, tremors, weakness and light-headedness.  Hematological:  Negative for adenopathy. Does not bruise/bleed easily.  Psychiatric/Behavioral:  Negative for agitation, behavioral problems, confusion, decreased concentration, dysphoric mood and sleep disturbance.        Objective:   Physical Exam Constitutional:      General: She is not in acute distress.    Appearance: Normal appearance. She is well-developed. She is not ill-appearing or diaphoretic.  HENT:     Head:  Normocephalic and atraumatic.     Right Ear: Hearing and external ear normal.     Left Ear: Hearing and external ear normal.     Nose: No nasal deformity or rhinorrhea.  Eyes:     General: No scleral icterus.    Conjunctiva/sclera: Conjunctivae normal.     Right eye: Right conjunctiva is not injected.     Left eye: Left conjunctiva is not injected.     Pupils: Pupils are equal, round, and reactive to light.  Neck:     Vascular: No JVD.  Cardiovascular:     Rate and Rhythm: Normal rate and regular rhythm.     Heart sounds: S1 normal and S2 normal.  Pulmonary:     Effort: Pulmonary effort is normal. Prolonged expiration present. No respiratory distress.     Breath sounds: No stridor. No wheezing or rhonchi.  Abdominal:     General: Bowel sounds are normal. There is no distension.     Palpations: Abdomen is soft.     Tenderness: There is no abdominal tenderness.  Musculoskeletal:        General: Normal range of motion.     Right shoulder: Normal.     Left shoulder: Normal.     Cervical back: Normal range of motion and neck supple.     Right hip: Normal.     Left hip: Normal.     Right knee: Normal.     Left knee: Normal.  Lymphadenopathy:     Head:     Right side of head: No submandibular, preauricular or posterior auricular adenopathy.     Left side of head: No submandibular, preauricular or posterior auricular adenopathy.     Cervical: No cervical adenopathy.     Right cervical: No superficial or deep cervical adenopathy.  Left cervical: No superficial or deep cervical adenopathy.  Skin:    General: Skin is warm and dry.     Coloration: Skin is not pale.     Findings: No abrasion, bruising, ecchymosis, erythema, lesion or rash.     Nails: There is no clubbing.  Neurological:     Mental Status: She is alert and oriented to person, place, and time.     Sensory: No sensory deficit.     Coordination: Coordination normal.     Gait: Gait normal.  Psychiatric:         Attention and Perception: She is attentive.        Speech: Speech normal.        Behavior: Behavior normal. Behavior is cooperative.        Thought Content: Thought content normal.        Judgment: Judgment normal.           Assessment & Plan:   Assessment and Plan    Bronchiectasis Chronic condition with exacerbation. CT scan shows infiltrates, indicating possible inflammation or infection. Differential includes MAC infection, other infections, or fluid accumulation. Aspergillosis contributes to lung scarring. - Order AFB cultures for sputum samples to assess for MAC infection. - Schedule follow-up in three months to review culture results and reassess symptoms. - Consult pharmacist regarding potential drug interactions and treatment options.  Mycobacterium avium complex (MAC) infection Chronic MAC infection with previous positive cultures. Symptoms include increased dyspnea, cough, CT scan shows multiple nodules.   - Order AFB cultures for sputum samples to assess for MAC infection and antibiotic sensitivities. - Schedule follow-up in three months to review culture results and reassess symptoms. - had pharmacy meet with the patient to go over her allergies the meds we use to treat MAI and  regarding potential drug interactions and treatment options.  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      I have personally spent 42 minutes involved in face-to-face and non-face-to-face activities for this patient on the day of the visit. Professional time spent includes the following activities: Preparing to see the patient review of cultures, CMP CBC , Obtaining and/or reviewing separately obtained history [facility] and [place] records,  Performing a medically appropriate examination and/or evaluation , Ordering AFB cultures, referring and  communicating with other health care professionals, Documenting clinical information in the EMR, Independently interpreting results (not separately reported), Communicating results to the patient and husband Counseling and educating the patient/family/caregiver and Care coordination (not separately reported).

## 2024-02-28 ENCOUNTER — Ambulatory Visit
Admission: EM | Admit: 2024-02-28 | Discharge: 2024-02-28 | Disposition: A | Discharge status: Home / Self Care | Attending: Emergency Medicine | Admitting: Emergency Medicine

## 2024-02-28 DIAGNOSIS — Z23 Encounter for immunization: Secondary | ICD-10-CM | POA: Diagnosis not present

## 2024-02-28 DIAGNOSIS — S61239A Puncture wound without foreign body of unspecified finger without damage to nail, initial encounter: Secondary | ICD-10-CM | POA: Diagnosis not present

## 2024-02-28 MED ORDER — TETANUS-DIPHTH-ACELL PERTUSSIS 5-2.5-18.5 LF-MCG/0.5 IM SUSY
0.5000 mL | PREFILLED_SYRINGE | Freq: Once | INTRAMUSCULAR | Status: AC
  Administered 2024-02-28: 0.5 mL via INTRAMUSCULAR

## 2024-02-28 NOTE — Discharge Instructions (Addendum)
 Your tetanus was updated today.    Keep your wound clean and dry.  Wash it gently twice a day with soap and water.  Then apply an antibiotic cream and bandage.    Follow up right away if you see signs of infection, such as redness, pus-like drainage, fever, or other concerning symptoms.

## 2024-02-28 NOTE — ED Triage Notes (Signed)
 Pt reports accidental small lac to R index finger. No active bleeding, states she already cleaned it very well.  No tetanus shot in 10+ years.

## 2024-02-28 NOTE — ED Provider Notes (Signed)
 Sandra Burns    CSN: [id number] Arrival date & time: 02/28/24  1903      History   Chief Complaint No chief complaint on file.   HPI Sandra Burns is a 68 y.o. female.  Patient presents with a puncture wound on her right index finger that occurred this evening when she accidentally punctured her finger with a staple remover.  Bleeding controlled with direct pressure.  No numbness or weakness.  Patient is not on anticoagulants.  Last tetanus 2015.    The history is provided by the patient and medical records.    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   Anxiety    Arrhythmia    Arthritis    Asthma    Back pain    Bronchiectasis (HCC) 09/04/2019   Chronic obstructive airway disease (HCC)    Depression    Diverticulosis    Fibromyalgia    Fibromyalgia    GERD (gastroesophageal reflux disease)    Heart murmur    Irritable bowel syndrome    Kidney stone    Lupus    "suspected"   Migraine    Mitral valve prolapse    Multiple allergies 09/04/2019   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   Mycobacterium avium complex colonization 02/21/2024   Osteoporosis    Paroxysmal A-fib (HCC)    Psoriatic arthritis (HCC)    Raynaud's disease    Reactive airway disease    S/P chemotherapy, time since greater than 12 weeks 1984   cervical ca   Seizure (HCC)    Sjogren's syndrome (HCC)    Vaccine counseling 12/09/2020   Weight loss 11/10/2021    Patient Active Problem List   Diagnosis Date Noted   Mycobacterium avium complex colonization 02/21/2024   Varicose veins with inflammation 04/26/2022   Aortic atherosclerosis (HCC) 04/26/2022   Weight loss 11/10/2021   Abdominal pain 11/10/2021   DDD (degenerative disc disease), lumbosacral 08/06/2021   Corneal dystrophy, 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   Diverticulosis 08/06/2021   Dry eye syndrome of both eyes due to  meibomian gland dysfunction 08/06/2021   Iritis 08/06/2021   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   Psoriasis (a type of skin inflammation) 08/06/2021   Spine pain, multilevel 03/04/2021   Vaccine counseling 12/09/2020   Bronchiectasis (HCC) 09/04/2019   Multiple allergies 09/04/2019   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   GAD (generalized anxiety disorder) 07/09/2018   Asthma 07/09/2018   Tricuspid valve prolapse 07/09/2018   Cardiac arrhythmia 07/09/2018   MVP (mitral valve prolapse) 07/09/2018   GERD (gastroesophageal reflux disease) 07/09/2018   Migraines 07/09/2018   PTSD (post-traumatic stress disorder) 07/09/2018   IBS (irritable bowel syndrome) 07/09/2018   Psoriatic arthritis (HCC) 07/09/2018   COPD, mild (HCC) 05/23/2018   Chronic midline low back pain with sciatica 05/17/2018   Corneal dystrophy 05/04/2017    Past Surgical History:  Procedure Laterality Date   ABDOMINAL HYSTERECTOMY     BACK SURGERY     BREAST BIOPSY Left 20+ YRS AGO   EXCISIONAL - NEG   CHOLECYSTECTOMY     ESOPHAGOGASTRODUODENOSCOPY N/A 11/11/2021   Procedure: ESOPHAGOGASTRODUODENOSCOPY (EGD);  Surgeon: Quintin Buckle, DO;  Location: [facility] ENDOSCOPY;  Service: Gastroenterology;  Laterality: N/A;   OOPHORECTOMY     TUBAL LIGATION      OB History   No obstetric history on file.      Home  Medications    Prior to Admission medications   Medication Sig Start Date End Date Taking? Authorizing Provider  acetaminophen (TYLENOL) 500 MG tablet Take 500 mg by mouth every 6 (six) hours as needed.   Yes [provider]  ALPRAZolam  (XANAX ) 0.5 MG tablet TAKE 1.5 PILLS IN MORNING, 1.5 A PILLS AT LUNCH, 1 AT DINNER, AND 1.5 PILLS AT BEDTIME, ALL AS NEEDED 11/08/23  Yes Hurst, Teresa T, PA-C  Artificial Tear Ointment (REFRESH P.M. OP) Place 1 application into both eyes at bedtime. Apply eye gel to both eyelids   Yes [provider]  aspirin 81 MG chewable tablet Chew 81 mg by mouth daily. Patient not taking: Reported on 04/14/2022    [provider]  atenolol (TENORMIN) 25 MG tablet Take 12.5 mg by mouth 2 (two) times daily.    Yes [provider]  budesonide (PULMICORT) 180 MCG/ACT inhaler Inhale 2 puffs into the lungs 2 (two) times daily.    Yes [provider]  Cholecalciferol (VITAMIN D3) 10 MCG (400 UNIT) tablet Take by mouth. Patient not taking: Reported on 04/14/2022    [provider]  COVID-19 mRNA bivalent vaccine, Pfizer, (PFIZER COVID-19 VAC BIVALENT) injection Inject into the muscle. Patient not taking: Reported on 04/26/2022 06/15/21   Liane Redman, MD  COVID-19 mRNA vaccine [phone] (COMIRNATY ) syringe Inject into the muscle. Patient not taking: Reported on 02/21/2024 07/06/22   Liane Redman, MD  COVID-19 mRNA vaccine, Pfizer, (COMIRNATY ) syringe Inject 0.3 mLs into the muscle. Patient not taking: Reported on 02/21/2024 06/15/23   Liane Redman, MD  dicyclomine  (BENTYL ) 20 MG tablet Take 1 tablet (20 mg total) by mouth 2 (two) times daily. 11/09/17  Yes Trish Furl, MD  fluticasone (FLONASE) 50 MCG/ACT nasal spray Place 1 spray into both nostrils daily as needed for allergies. 07/07/18  Yes [provider]  gabapentin (NEURONTIN) 100 MG capsule Take 100 mg by mouth at bedtime as needed. Patient not taking: Reported on 04/14/2022    [provider]  ipratropium (ATROVENT HFA) 17 MCG/ACT inhaler Inhale 2 puffs into the lungs 4 (four) times daily.   Yes [provider]  levalbuterol (XOPENEX HFA) 45 MCG/ACT inhaler Inhale 2 puffs into the lungs every 4 (four) hours as needed for wheezing.   Yes [provider]  Multiple Vitamin (MULTIVITAMIN WITH MINERALS) TABS Take 1 tablet by mouth daily.   Yes [provider]  mupirocin ointment (BACTROBAN) 2 % APPLY TO AFFECTED AREA 3 TIMES A DAY 08/23/19  Yes [provider]  pantoprazole (PROTONIX) 40 MG tablet Take 40 mg by mouth daily as needed (GERD). For stomach   Yes [provider]  Polyvinyl Alcohol-Povidone (REFRESH OP) Place 1 drop into both eyes daily as needed (dry eyes). Patient not taking: Reported on 02/21/2024    [provider]  ZIRGAN 0.15 % GEL APPLY 1 A SMALL AMOUNT IN AFFECTED EYE 5 TIMES A DAY UNTIL DIRECTED 06/10/19  Yes [provider]    Family History Family History  Problem Relation Age of Onset   Lupus Mother    Breast cancer Neg Hx     Social History Social History   Tobacco Use   Smoking status: Former    Current packs/day: 0.00    Types: Cigarettes    Quit date: 07/13/1981    Years since quitting: 42.6    Passive exposure: Past   Smokeless tobacco: Never  Vaping Use   Vaping status: Never Used  Substance  Use Topics   Alcohol use: Not Currently    Comment: drank as a teen.  Not since 1990   Drug use: Not Currently    Comment: pot as a teen     Allergies   Albuterol, Cephalosporins, Flagyl [metronidazole], Macrobid [nitrofurantoin macrocrystal], Neomy-bacit-polymyx-pramoxine, Penicillins, Sulfa antibiotics, Vancomycin, Vicodin [hydrocodone-acetaminophen], Amoxicillin, Ciprofloxacin, Latex, Bacitracin, Bacitracin-neomycin-polymyxin, Barium-containing compounds, Nitrofurantoin, Tobramycin, Decongest-aid [pseudoephedrine], Eggshell membrane (chicken) [egg shells], Hydrocodone-acetaminophen, Levofloxacin, Other, Prednisone, and Zithromax [azithromycin]   Review of Systems Review of Systems  Constitutional:  Negative for chills and fever.  Musculoskeletal:  Negative for arthralgias and joint swelling.  Skin:  Positive for wound. Negative for color change.  Neurological:  Negative for weakness and numbness.     Physical Exam Triage Vital Signs ED Triage Vitals  Encounter Vitals Group     BP      Systolic BP Percentile      Diastolic BP Percentile      Pulse      Resp      Temp       Temp src      SpO2      Weight      Height      Head Circumference      Peak Flow      Pain Score      Pain Loc      Pain Education      Exclude from Growth Chart    No data found.  Updated Vital Signs BP (!) 146/72   Pulse 85   Temp 98 F (36.7 C) (Oral)   Resp (!) 22   SpO2 98%   Visual Acuity Right Eye Distance:   Left Eye Distance:   Bilateral Distance:    Right Eye Near:   Left Eye Near:    Bilateral Near:     Physical Exam Constitutional:      General: She is not in acute distress. HENT:     Mouth/Throat:     Mouth: Mucous membranes are moist.  Cardiovascular:     Rate and Rhythm: Normal rate and regular rhythm.  Pulmonary:     Effort: Pulmonary effort is normal. No respiratory distress.  Musculoskeletal:        General: No swelling or deformity. Normal range of motion.  Skin:    General: Skin is warm and dry.     Capillary Refill: Capillary refill takes less than 2 seconds.     Findings: Lesion present. No erythema.     Comments: Small puncture wound on palmar side of right index finger.  No bleeding, erythema, drainage.  Neurological:     General: No focal deficit present.     Mental Status: She is alert.     Sensory: No sensory deficit.     Motor: No weakness.      UC Treatments / Results  Labs (all labs ordered are listed, but only abnormal results are displayed) Labs Reviewed - No data to display  EKG   Radiology No results found.  Procedures Procedures (including critical care time)  Medications Ordered in UC Medications  Tdap (BOOSTRIX ) injection 0.5 mL (has no administration in time range)    Initial Impression / Assessment and Plan / UC Course  I have reviewed the triage vital signs and the nursing notes.  Pertinent labs & imaging results that were available during my care of the patient were reviewed by me and considered in my medical decision making (see chart for details).    Puncture  wound of right index finger.   Tetanus updated today.  Wound care instructions and signs of infection discussed.  Education provided on puncture wound.  Instructed patient to follow-up right away if she notes signs of infection.  She agrees to plan of care.  Final Clinical Impressions(s) / UC Diagnoses   Final diagnoses:  Puncture wound of finger of right hand, initial encounter     Discharge Instructions      Your tetanus was updated today.    Keep your wound clean and dry.  Wash it gently twice a day with soap and water.  Then apply an antibiotic cream and bandage.    Follow up right away if you see signs of infection, such as redness, pus-like drainage, fever, or other concerning symptoms.      ED Prescriptions   None    PDMP not reviewed this encounter.   Wellington Half, NP 02/28/24 1945

## 2024-03-15 ENCOUNTER — Ambulatory Visit: Admitting: Psychiatry

## 2024-03-15 DIAGNOSIS — F411 Generalized anxiety disorder: Secondary | ICD-10-CM

## 2024-03-15 DIAGNOSIS — F330 Major depressive disorder, recurrent, mild: Secondary | ICD-10-CM | POA: Diagnosis not present

## 2024-03-15 DIAGNOSIS — Z636 Dependent relative needing care at home: Secondary | ICD-10-CM | POA: Diagnosis not present

## 2024-03-15 DIAGNOSIS — A310 Pulmonary mycobacterial infection: Secondary | ICD-10-CM | POA: Diagnosis not present

## 2024-03-15 DIAGNOSIS — Z639 Problem related to primary support group, unspecified: Secondary | ICD-10-CM

## 2024-03-15 DIAGNOSIS — Z8659 Personal history of other mental and behavioral disorders: Secondary | ICD-10-CM
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
## 2024-04-01 ENCOUNTER — Ambulatory Visit: Payer: Medicare Other | Admitting: Psychiatry

## 2024-04-01 DIAGNOSIS — Z8659 Personal history of other mental and behavioral disorders: Secondary | ICD-10-CM | POA: Diagnosis not present

## 2024-04-01 DIAGNOSIS — M5440 Lumbago with sciatica, unspecified side: Secondary | ICD-10-CM

## 2024-04-01 DIAGNOSIS — F411 Generalized anxiety disorder: Secondary | ICD-10-CM | POA: Diagnosis not present

## 2024-04-01 DIAGNOSIS — G8929 Other chronic pain: Secondary | ICD-10-CM

## 2024-04-01 DIAGNOSIS — F330 Major depressive disorder, recurrent, mild: Secondary | ICD-10-CM

## 2024-04-01 DIAGNOSIS — A310 Pulmonary mycobacterial infection: Secondary | ICD-10-CM

## 2024-04-01 DIAGNOSIS — Z636 Dependent relative needing care at home: Secondary | ICD-10-CM

## 2024-04-01 DIAGNOSIS — F5089 Other specified eating disorder: Secondary | ICD-10-CM
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
## 2024-04-24 ENCOUNTER — Ambulatory Visit: Admitting: Psychiatry

## 2024-04-24 DIAGNOSIS — F411 Generalized anxiety disorder: Secondary | ICD-10-CM | POA: Diagnosis not present

## 2024-04-24 DIAGNOSIS — M5440 Lumbago with sciatica, unspecified side: Secondary | ICD-10-CM

## 2024-04-24 DIAGNOSIS — G8929 Other chronic pain: Secondary | ICD-10-CM

## 2024-04-24 DIAGNOSIS — Z889 Allergy status to unspecified drugs, medicaments and biological substances status: Secondary | ICD-10-CM

## 2024-04-24 DIAGNOSIS — Z8659 Personal history of other mental and behavioral disorders: Secondary | ICD-10-CM | POA: Diagnosis not present

## 2024-04-24 DIAGNOSIS — Z636 Dependent relative needing care at home: Secondary | ICD-10-CM | POA: Diagnosis not present

## 2024-04-24 DIAGNOSIS — F330 Major depressive disorder, recurrent, mild: Secondary | ICD-10-CM | POA: Diagnosis not present

## 2024-04-24 NOTE — Progress Notes (Unsigned)
 Psychotherapy Progress Note [facility] [name], PhD LP  [name])    MRN: [id number] Therapy format: Individual psychotherapy Date: 04/24/2024      Start: 3:12p     Stop: 4:12p     Time Spent: 60 min Location: Telehealth visit -- I connected with this patient by an approved telecommunication method (video), with her informed consent, and verifying identity and patient privacy.  I was located at my office and patient at her home.  As needed, we discussed the limitations, risks, and security and privacy concerns associated with telehealth service, including the availability and conditions which currently govern in-person appointments and the possibility that 3rd-party payment may not be fully guaranteed and she may be responsible for charges.  After she indicated understanding, we proceeded with the session.  Also discussed treatment planning, as needed, including ongoing verbal agreement with the plan, the opportunity to ask and answer all questions, her demonstrated understanding of instructions, and her readiness to call the office should symptoms worsen or she feels she is in a crisis state and needs more immediate and tangible assistance.   Session narrative (presenting needs, interim history, self-report of stressors and symptoms, applications of prior therapy, status changes, and interventions made in session) Sinus infection, worn and congested.  Taking doxycycline  on her own, figures she dosed herself with paper mite allergens helping a friend move dusty files, then it got worse, so the inflammation of it enabled a bacterial infection, and doxycycline  has an antiinflammatory property anyway.  But still using half-dose, in order to be kinder to her stomach and encourage her immune system to do some of the work.  Taking extra vitamins and rest, liquid Mucinex, figures she's working it through.  Drainage and throat irritation also increasing  demand on her inhaler.  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  Held out through an urge to call 911, saw her rhythm recover.  Deferred to her knowledge of her health and treatment for further handling.  (Doxycycline  is on hand already for blepharitis if needed.)  Sleep is more disturbed lately with the heat, compounded by her need for weight on her.  Got the bed worked out by dosing herself with progressive exposures to the off-gassing bedding until now in the bed normally for 10 days.    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  Frustrated with friends as a whole changing the subject to themselves and saying I know what you mean... when they don't.  Often about Sandra Burns and LBD.  One friend in [place] refreshingly understanding and knowledgeable, turns out she is friends with [employer] manager on LBD, and also a new chapter corodinator in [place] with Native American background herself, quick intelligence, and command of her information -- all much like Sandra Burns herself, and refreshingly well-suited for friendship.  Overall, feels she only has 2 friends who are consistently good enough at conversational and empathic listening skills to suffice.  Knows she is an over listener herself,  trying to assert better than that when a friend runs on.  Support/validation provided, and encouraged in using her rights to ask friends to adjsut their ways  Sandra Burns spousal support program shut down, now only support group.  Sandra Burns will be getting a biopsy for folded proteins (P-SYN test).  Difficult to get across what she deals with with Sandra Burns, relates further concerns  about Sandra Burns' behavioral regulation and potential for LBD violence.  Has guns in a bag, figures to move it, even if it bothers Sandra Burns.  Has seen several times where he was sleepwalking, e.g., finding her slippers rearranged one morning in a way she never would do  herself, and typing on her back.  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  Endorsed moving the weapons, and if challenged, ask Sandra Burns to put himself in her shoes.    Would like to discuss her mom next session.  Therapeutic modalities: Cognitive Behavioral Therapy, Solution-Oriented/Positive Psychology, Ego-Supportive, and Assertiveness/Communication  Mental Status/Observations:  Appearance:   Casual     Behavior:  Appropriate  Motor:  Normal  Speech/Language:   Clear and Coherent  Affect:  Appropriate  Mood:  normal and tired  Thought process:  normal  Thought content:    WNL  Sensory/Perceptual disturbances:    WNL  Orientation:  Fully oriented  Attention:  Good    Concentration:  Good  Memory:  WNL  Insight:    Good  Judgment:   Good  Impulse Control:  Good   Risk Assessment: Danger to Self: No Self-injurious Behavior: No Danger to Others: No Physical Aggression / Violence: No Duty to Warn: No Access to Firearms a concern: No  Assessment of progress:  progressing  Diagnosis:   ICD-10-CM   1. Major depressive disorder, recurrent episode, mild with melancholic features (HCC)  F33.0     2. Generalized anxiety disorder (with hx panic, agoraphobia, and PTSD)  F41.1     3. Caregiver stress  Z63.6     4. History of posttraumatic stress disorder (PTSD)  Z86.59     5. Chronic midline low back pain with sciatica, sciatica laterality unspecified, with hx of extensive scoliosis surgery  M54.40    G89.29     6. Multiple allergies, with complex chronic pulmonary disease, and multiple immune illnesses and high risk of disabling infection  Z88.9      Plan:  Regrets/trauma hx OK to continue reminiscing, helpful to reach the good with the bad Remember, contextualize, forgive ad lib -- Sandra Burns, parents, and herself Re bereavement and unfinished business  with Sandra Burns -- self-affirm bereavement, good work done to reconcile, and constructive beliefs about Sandra Burns's fate and God's care/concern, and continue tasks and needful communication with bereaved family.  Discretion about revealing to SIL that Sandra Burns had been consulting her in fear and probably misguided concern that he needed to spare her.  Options to talk or write to [facility] in absentia, pray, memorialize as she sees fit, reaffirm forgiveness posthumously. Phobias -- foodborne and airborne illness Reframe the moment she feels like avoiding food as her golden opportunity to say yes and to be in control every bit a much as saying no Continue exposures to perceived risk of contamination she knows is minuscule or well-controlled Reduce arbitrary wait time for food, increase bites taken as a test experiment with broader and deeper intake Use imaginary [place] as coach when facing anxiety  Take food-related dreams as a sign of restoring normal appetite OK to use anorexic videos as inspiration to eat more normally instead  Try smelling spices and/or commitment to reopen a forgotten taste per week as ways to revive appetite rather than rely on moralizing with herself to do as she should Similarly, assess and relax restrictions and protocols on airborne illness,  within reason Continue self-help through [name] website ad lib Physical health Evaluate as needed hearing loss Stretch liberally as able for orthopedic/spinal issues Observe reasonable standards for airborne infection control given her lung condition For sleep, continue to establish electronics curfew and willingness to turn in For weight maintenance, ensure good calorie density in food choices and sufficient quantity For health, ensure good variety, especially adequate vegetable and fruit nutrients, antioxidant content, and wheat OK Caregiver stress Concur with 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  It does mean an imposed parenting role until he transitions to professional nursing care. Pursue reasonable collaborations with his treatment team for cognitive health Seek adequate time away from home and perpetual care -- may be opportunities safe enough, where Sandra Burns is capable of managing some time alone.  Look into respite care and facility and in-home care alternatives for Sandra Burns to get ahead on eventual developments. 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   Assert as needed with friend [name].  OK to be direct, keep it about behavior and its better/worse effects.  Try to maintain benevolent interpretation, resist equating her with [name] or other treacherous personalities.  Assume not knowing before not caring.  Continue willing to ask as needed if anything is amiss, to demonstrate safety to talk, actually. Family strife Tasks of bereavement as indicated Continue stoic approach to son [name] resentment/rejection.  Refrain from drastic moves to block, just trust she's strong enough to non-reply if what she gets is actually unfair or aggressive. Self-affirm she can legitimately love the child and fear/guard against the adult he became without it being sinful Other recommendations/advice -- As may be noted above.  Continue to utilize previously learned skills ad lib. Medication compliance -- Maintain medication as prescribed and  work faithfully with relevant prescriber(s) if any changes are desired or seem indicated. Crisis service -- Aware of call list and work-in appts.  Call the clinic on-call service, 988/hotline, 911, or present to [facility] or ER if any life-threatening psychiatric crisis. Followup -- Return for time as already scheduled.  Next scheduled visit with me 05/16/2024.  Next scheduled in this office 04/26/2024.  Lamar Kendall, PhD [name], PhD LP Clinical Psychologist, [facility] [address] [place], [place] [address] [phone]

## 2024-04-26 ENCOUNTER — Ambulatory Visit: Admitting: Physician Assistant

## 2024-04-26 ENCOUNTER — Ambulatory Visit: Payer: Medicare Other | Admitting: Physician Assistant

## 2024-04-26 ENCOUNTER — Encounter: Payer: Self-pay | Admitting: Physician Assistant

## 2024-04-26 DIAGNOSIS — Z636 Dependent relative needing care at home: Secondary | ICD-10-CM

## 2024-04-26 DIAGNOSIS — F411 Generalized anxiety disorder: Secondary | ICD-10-CM | POA: Diagnosis not present

## 2024-04-26 DIAGNOSIS — A310 Pulmonary mycobacterial infection: Secondary | ICD-10-CM

## 2024-04-26 MED ORDER — ALPRAZOLAM 0.5 MG PO TABS: ORAL_TABLET | ORAL | 5 refills | Status: DC

## 2024-04-26 NOTE — Progress Notes (Signed)
 [facility]  [name],  MRN: [id number]  PCP: Sadie Manna, MD  Date of Evaluation: 04/26/2024 Time spent:20 minutes  Chief Complaint:  Chief Complaint   Anxiety; Follow-up    HISTORY/CURRENT STATUS: For routine med check.  As far as the anxiety goes, she is still responding well to the Xanax . Feels generally anxious at times, her husband is getting worse w/ dementia so she's under a lot of stress in several different facets of life, with him and her own physical health.    Energy and motivation are fair, mostly d/t the physical issues.   Sleeps better now that she and husband sleep in different beds now.  With his dementia and nightmares, he was waking up unintentially fighting her. His dr told them they have to sleep in different beds.   ADLs and personal hygiene are normal.   Denies any changes in concentration, making decisions, or remembering things.  Appetite has not changed.  Weight is stable.  No mania, delirium, AH/VH.  No SI/HI.  Individual Medical History/ Review of Systems: Changes? :Yes     worsening of lung issues, infiltrates and nodule on CT in the past few months. Has seen her pulmonologist and Infectious disease providers in the past few months.  Added nebulizer treatments.  Past medications for mental health diagnoses include: Zoloft, caused psychosis w/ hallucinations,  BuSpar, Ativan, Effexor (became psychotic) She was told to never take any kind of SSRI/SNRI again. Wellbutrin caused agitation.  Allergies: Albuterol, Cephalosporins, Flagyl [metronidazole], Macrobid [nitrofurantoin macrocrystal], Neomy-bacit-polymyx-pramoxine, Penicillins, Sulfa antibiotics, Vancomycin, Vicodin [hydrocodone-acetaminophen], Amoxicillin, Ciprofloxacin, Latex, Bacitracin, Bacitracin-neomycin-polymyxin, Barium-containing compounds, Nitrofurantoin, Tobramycin, Decongest-aid [pseudoephedrine], Eggshell membrane (chicken) [egg shells],  Hydrocodone-acetaminophen, Levofloxacin, Other, Prednisone, and Zithromax [azithromycin]  Current Medications:  Current Outpatient Medications:    acetaminophen (TYLENOL) 500 MG tablet, Take 500 mg by mouth every 6 (six) hours as needed., Disp: , Rfl:    Artificial Tear Ointment (REFRESH P.M. OP), Place 1 application into both eyes at bedtime. Apply eye gel to both eyelids, Disp: , Rfl:    atenolol (TENORMIN) 25 MG tablet, Take 12.5 mg by mouth 2 (two) times daily. , Disp: , Rfl:    budesonide (PULMICORT) 180 MCG/ACT inhaler, Inhale 2 puffs into the lungs 2 (two) times daily. , Disp: , Rfl:    dicyclomine  (BENTYL ) 20 MG tablet, Take 1 tablet (20 mg total) by mouth 2 (two) times daily., Disp: 20 tablet, Rfl: 0   fluticasone (FLONASE) 50 MCG/ACT nasal spray, Place 1 spray into both nostrils daily as needed for allergies., Disp: , Rfl: 6   ipratropium (ATROVENT HFA) 17 MCG/ACT inhaler, Inhale 2 puffs into the lungs 4 (four) times daily., Disp: , Rfl:    levalbuterol (XOPENEX HFA) 45 MCG/ACT inhaler, Inhale 2 puffs into the lungs every 4 (four) hours as needed for wheezing., Disp: , Rfl:    Multiple Vitamin (MULTIVITAMIN WITH MINERALS) TABS, Take 1 tablet by mouth daily., Disp: , Rfl:    mupirocin ointment (BACTROBAN) 2 %, APPLY TO AFFECTED AREA 3 TIMES A DAY, Disp: , Rfl:    pantoprazole (PROTONIX) 40 MG tablet, Take 40 mg by mouth daily as needed (GERD). For stomach, Disp: , Rfl:    XDEMVY 0.25 % SOLN, , Disp: , Rfl:    ZIRGAN 0.15 % GEL, APPLY 1 A SMALL AMOUNT IN AFFECTED EYE 5 TIMES A DAY UNTIL DIRECTED, Disp: , Rfl:    ALPRAZolam  (XANAX ) 0.5 MG tablet, TAKE 1.5 PILLS IN MORNING, 1.5 A PILLS AT  LUNCH, 1 AT DINNER, AND 1.5 PILLS AT BEDTIME, ALL AS NEEDED, Disp: 165 tablet, Rfl: 5   aspirin 81 MG chewable tablet, Chew 81 mg by mouth daily. (Patient not taking: Reported on 04/14/2022), Disp: , Rfl:    Cholecalciferol (VITAMIN D3) 10 MCG (400 UNIT) tablet, Take by mouth. (Patient not taking: Reported  on 04/14/2022), Disp: , Rfl:    COVID-19 mRNA bivalent vaccine, Pfizer, (PFIZER COVID-19 VAC BIVALENT) injection, Inject into the muscle. (Patient not taking: Reported on 04/26/2022), Disp: 0.3 mL, Rfl: 0   COVID-19 mRNA vaccine 2023-2024 (COMIRNATY ) syringe, Inject into the muscle. (Patient not taking: Reported on 02/21/2024), Disp: 0.3 mL, Rfl: 0   COVID-19 mRNA vaccine, Pfizer, (COMIRNATY ) syringe, Inject 0.3 mLs into the muscle. (Patient not taking: Reported on 02/21/2024), Disp: 0.3 mL, Rfl: 0   gabapentin (NEURONTIN) 100 MG capsule, Take 100 mg by mouth at bedtime as needed. (Patient not taking: Reported on 04/14/2022), Disp: , Rfl:    Polyvinyl Alcohol-Povidone (REFRESH OP), Place 1 drop into both eyes daily as needed (dry eyes). (Patient not taking: Reported on 02/21/2024), Disp: , Rfl:  Medication Side Effects: none  Family Medical/ Social History: Changes? No  MENTAL HEALTH EXAM:  There were no vitals taken for this visit.There is no height or weight on file to calculate BMI.  General Appearance: Casual, Well Groomed, and very thin  Eye Contact:  Good  Speech:  Clear and Coherent and Normal Rate  Volume:  Normal  Mood:  Euthymic  Affect:  Congruent  Thought Process:  Goal Directed and Descriptions of Associations: Intact  Orientation:  Full (Time, Place, and Person)  Thought Content: Logical   Suicidal Thoughts:  No  Homicidal Thoughts:  No  Memory:  WNL  Judgement:  Good   Insight:  Good  Psychomotor Activity:  Normal  Concentration:  Concentration: Good and Attention Span: Good  Recall:  Good  Fund of Knowledge: Good  Language: Good  Assets:  Communication Skills Desire for Improvement Financial Resources/Insurance Housing Resilience Transportation  ADL's:  Intact  Cognition: WNL  Prognosis:  Good   DIAGNOSES:    ICD-10-CM   1. Generalized anxiety disorder (with hx panic, agoraphobia, and PTSD)  F41.1     2. Caregiver stress  Z63.6     3. Mycobacterium avium  complex (HCC)  A31.0       Receiving Psychotherapy: Yes w/ Dr. [name]  RECOMMENDATIONS:  PDMP reviewed.  Last Xanax  filled 04/02/2024. I provided approximately 20 minutes of face to face time during this encounter, including time spent before and after the visit in records review, medical decision making, counseling pertinent to today's visit, and charting.   The Xanax  is as effective as it can be.  She wants to decrease the dose eventually.  She was 'taught to be strong' and it makes her feel weak.  Unfortunately due to the stress she is under, this is not a good time to make a change like that.  She understands.  Continue Xanax  0.5 mg, 1.5 pills every morning, 1.25 pills at lunch, 1 pill at dinner, 1.5 at bedtime as needed.   Continue all supplements. Continue psychotherapy with Dr. [name].  Return in 6 months.   Verneita Cooks, PA-C

## 2024-04-30 ENCOUNTER — Ambulatory Visit (INDEPENDENT_AMBULATORY_CARE_PROVIDER_SITE_OTHER): Payer: Medicare HMO

## 2024-04-30 ENCOUNTER — Encounter (INDEPENDENT_AMBULATORY_CARE_PROVIDER_SITE_OTHER): Payer: Self-pay | Admitting: Vascular Surgery

## 2024-04-30 ENCOUNTER — Ambulatory Visit (INDEPENDENT_AMBULATORY_CARE_PROVIDER_SITE_OTHER): Payer: Medicare HMO | Admitting: Vascular Surgery

## 2024-04-30 VITALS — BP 137/75 | HR 65 | Wt 113.4 lb

## 2024-04-30 DIAGNOSIS — I700 Atherosclerosis of aorta: Secondary | ICD-10-CM | POA: Diagnosis not present

## 2024-04-30 NOTE — Progress Notes (Unsigned)
 MRN : [id number]  Sandra Burns is a 68 y.o. (June 12, 1956) female who presents with chief complaint of No chief complaint on file. SABRA  History of Present Illness: Patient returns today in follow up of ***  Current Outpatient Medications  Medication Sig Dispense Refill   acetaminophen (TYLENOL) 500 MG tablet Take 500 mg by mouth every 6 (six) hours as needed.     ALPRAZolam  (XANAX ) 0.5 MG tablet TAKE 1.5 PILLS IN MORNING, 1.5 A PILLS AT LUNCH, 1 AT DINNER, AND 1.5 PILLS AT BEDTIME, ALL AS NEEDED 165 tablet 5   Artificial Tear Ointment (REFRESH P.M. OP) Place 1 application into both eyes at bedtime. Apply eye gel to both eyelids     aspirin 81 MG chewable tablet Chew 81 mg by mouth daily. (Patient not taking: Reported on 04/14/2022)     atenolol (TENORMIN) 25 MG tablet Take 12.5 mg by mouth 2 (two) times daily.      budesonide (PULMICORT) 180 MCG/ACT inhaler Inhale 2 puffs into the lungs 2 (two) times daily.      Cholecalciferol (VITAMIN D3) 10 MCG (400 UNIT) tablet Take by mouth. (Patient not taking: Reported on 04/14/2022)     COVID-19 mRNA bivalent vaccine, Pfizer, (PFIZER COVID-19 VAC BIVALENT) injection Inject into the muscle. (Patient not taking: Reported on 04/26/2022) 0.3 mL 0   COVID-19 mRNA vaccine 2023-2024 (COMIRNATY ) syringe Inject into the muscle. (Patient not taking: Reported on 02/21/2024) 0.3 mL 0   COVID-19 mRNA vaccine, Pfizer, (COMIRNATY ) syringe Inject 0.3 mLs into the muscle. (Patient not taking: Reported on 02/21/2024) 0.3 mL 0   dicyclomine  (BENTYL ) 20 MG tablet Take 1 tablet (20 mg total) by mouth 2 (two) times daily. 20 tablet 0   fluticasone (FLONASE) 50 MCG/ACT nasal spray Place 1 spray into both nostrils daily as needed for allergies.  6   gabapentin (NEURONTIN) 100 MG capsule Take 100 mg by mouth at bedtime as needed. (Patient not taking: Reported on 04/14/2022)     ipratropium (ATROVENT HFA) 17 MCG/ACT inhaler Inhale 2 puffs into the lungs 4 (four) times daily.      levalbuterol (XOPENEX HFA) 45 MCG/ACT inhaler Inhale 2 puffs into the lungs every 4 (four) hours as needed for wheezing.     Multiple Vitamin (MULTIVITAMIN WITH MINERALS) TABS Take 1 tablet by mouth daily.     mupirocin ointment (BACTROBAN) 2 % APPLY TO AFFECTED AREA 3 TIMES A DAY     pantoprazole (PROTONIX) 40 MG tablet Take 40 mg by mouth daily as needed (GERD). For stomach     Polyvinyl Alcohol-Povidone (REFRESH OP) Place 1 drop into both eyes daily as needed (dry eyes). (Patient not taking: Reported on 02/21/2024)     XDEMVY 0.25 % SOLN      ZIRGAN 0.15 % GEL APPLY 1 A SMALL AMOUNT IN AFFECTED EYE 5 TIMES A DAY UNTIL DIRECTED     No current facility-administered medications for this visit.    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   Anxiety    Arrhythmia    Arthritis    Asthma    Back pain    Bronchiectasis (HCC) 09/04/2019   Chronic obstructive airway disease (HCC)    Depression    Diverticulosis    Fibromyalgia    Fibromyalgia    GERD (gastroesophageal reflux disease)    Heart murmur    Irritable bowel syndrome    Kidney stone  Lupus    suspected   Migraine    Mitral valve prolapse    Multiple allergies 09/04/2019   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   Mycobacterium avium complex colonization 02/21/2024   Osteoporosis    Paroxysmal A-fib (HCC)    Psoriatic arthritis (HCC)    Raynaud's disease    Reactive airway disease    S/P chemotherapy, time since greater than 12 weeks 1984   cervical ca   Seizure (HCC)    Sjogren's syndrome (HCC)    Vaccine counseling 12/09/2020   Weight loss 11/10/2021    Past Surgical History:  Procedure Laterality Date   ABDOMINAL HYSTERECTOMY     BACK SURGERY     BREAST BIOPSY Left 20+ YRS AGO   EXCISIONAL - NEG   CHOLECYSTECTOMY     ESOPHAGOGASTRODUODENOSCOPY N/A 11/11/2021   Procedure: ESOPHAGOGASTRODUODENOSCOPY (EGD);   Surgeon: Onita Elspeth Sharper, DO;  Location: [facility] ENDOSCOPY;  Service: Gastroenterology;  Laterality: N/A;   OOPHORECTOMY     TUBAL LIGATION       Social History   Tobacco Use   Smoking status: Former    Current packs/day: 0.00    Types: Cigarettes    Quit date: 07/13/1981    Years since quitting: 42.8    Passive exposure: Past   Smokeless tobacco: Never  Vaping Use   Vaping status: Never Used  Substance Use Topics   Alcohol use: Not Currently    Comment: drank as a teen.  Not since 1990   Drug use: Not Currently    Comment: pot as a teen   ***    Family History  Problem Relation Age of Onset   Lupus Mother    Breast cancer Neg Hx    ***  Allergies  Allergen Reactions   Albuterol Other (See Comments) and Palpitations    Atrial fibrillation Atrial fibrillation    Cephalosporins Itching and Rash    Rash and itching   Flagyl [Metronidazole] Dermatitis and Other (See Comments)    Other Reaction: SKIN SLOUGHING   Macrobid [Nitrofurantoin Macrocrystal] Itching   Neomy-Bacit-Polymyx-Pramoxine Itching and Swelling    Pt got the issue for the eye lids after instill the eyes drops.    Penicillins Anaphylaxis   Sulfa Antibiotics Itching   Vancomycin Shortness Of Breath    Other reaction(s): Unknown   Vicodin [Hydrocodone-Acetaminophen] Shortness Of Breath   Amoxicillin Itching and Swelling    She had intense itching and a rash see updated answers to questions below  Has patient had a PCN reaction causing immediate rash, facial/tongue/throat swelling, SOB or lightheadedness with hypotension: No Has patient had a PCN reaction causing severe rash involving mucus membranes or skin necrosis: No Has patient had a PCN reaction that required hospitalization: No Has patient had a PCN reaction occurring within the last 10 years: No If all of the above answers are NO, then may proceed with Ceph   Ciprofloxacin Swelling    Arm swelled up bright red , rash, itching   Latex  Rash   Bacitracin    Bacitracin-Neomycin-Polymyxin Itching and Swelling    Pt got the issue for the eye lids after instill the eyes drops.    Barium-Containing Compounds    Nitrofurantoin     Other reaction(s): Unknown   Tobramycin Itching and Swelling    Pt got the issue after instill the eyes drops   Decongest-Aid [Pseudoephedrine] Palpitations   Eggshell Membrane (Chicken) [Egg Shells] Rash   Hydrocodone-Acetaminophen Itching   Levofloxacin Rash  Itchy, swelling   Other Palpitations    BARIUM CONTRAST   Prednisone Palpitations   Zithromax [Azithromycin] Other (See Comments)    GI upset, C-diff     REVIEW OF SYSTEMS (Negative unless checked)  Constitutional: [] Weight loss  [] Fever  [] Chills Cardiac: [] Chest pain   [] Chest pressure   [] Palpitations   [] Shortness of breath when laying flat   [] Shortness of breath at rest   [] Shortness of breath with exertion. Vascular:  [] Pain in legs with walking   [] Pain in legs at rest   [] Pain in legs when laying flat   [] Claudication   [] Pain in feet when walking  [] Pain in feet at rest  [] Pain in feet when laying flat   [] History of DVT   [] Phlebitis   [] Swelling in legs   [] Varicose veins   [] Non-healing ulcers Pulmonary:   [] Uses home oxygen   [] Productive cough   [] Hemoptysis   [] Wheeze  [] COPD   [] Asthma Neurologic:  [] Dizziness  [] Blackouts   [] Seizures   [] History of stroke   [] History of TIA  [] Aphasia   [] Temporary blindness   [] Dysphagia   [] Weakness or numbness in arms   [] Weakness or numbness in legs Musculoskeletal:  [] Arthritis   [] Joint swelling   [] Joint pain   [] Low back pain Hematologic:  [] Easy bruising  [] Easy bleeding   [] Hypercoagulable state   [] Anemic   Gastrointestinal:  [] Blood in stool   [] Vomiting blood  [] Gastroesophageal reflux/heartburn   [] Abdominal pain Genitourinary:  [] Chronic kidney disease   [] Difficult urination  [] Frequent urination  [] Burning with urination   [] Hematuria Skin:  [] Rashes   [] Ulcers    [] Wounds Psychological:  [] History of anxiety   []  History of major depression.  Physical Examination  There were no vitals taken for this visit. Gen:  WD/WN, NAD Head: [place]/AT, No temporalis wasting. Ear/Nose/Throat: Hearing grossly intact, nares w/o erythema or drainage Eyes: Conjunctiva clear. Sclera non-icteric Neck: Supple.  Trachea midline Pulmonary:  Good air movement, no use of accessory muscles.  Cardiac: RRR, no JVD Vascular: *** Vessel Right Left  Radial Palpable Palpable                          PT Palpable Palpable  DP Palpable Palpable   Gastrointestinal: soft, non-tender/non-distended. No guarding/reflex.  Musculoskeletal: M/S 5/5 throughout.  No deformity or atrophy. *** edema. Neurologic: Sensation grossly intact in extremities.  Symmetrical.  Speech is fluent.  Psychiatric: Judgment intact, Mood & affect appropriate for pt's clinical situation. Dermatologic: No rashes or ulcers noted.  No cellulitis or open wounds.      Labs No results found for this or any previous visit (from the past 2160 hours).  Radiology No results found.  Assessment/Plan  No problem-specific Assessment & Plan notes found for this encounter.    Selinda Gu, MD  04/30/2024 10:46 AM    This note was created with Dragon medical transcription system.  Any errors from dictation are purely unintentional

## 2024-05-01 NOTE — Assessment & Plan Note (Signed)
 Her aortoiliac duplex today shows mild atherosclerotic changes to the aorta without any hemodynamically significant stenosis.  The maximal aortic diameter is 2.1 cm.  Her iliac arteries are normal in size and have normal biphasic flow without any hemodynamically significant stenosis.  This can be monitored every other year with duplex.

## 2024-05-16 ENCOUNTER — Ambulatory Visit (INDEPENDENT_AMBULATORY_CARE_PROVIDER_SITE_OTHER): Admitting: Psychiatry

## 2024-05-16 DIAGNOSIS — A310 Pulmonary mycobacterial infection: Secondary | ICD-10-CM | POA: Diagnosis not present

## 2024-05-16 DIAGNOSIS — F330 Major depressive disorder, recurrent, mild: Secondary | ICD-10-CM

## 2024-05-16 DIAGNOSIS — F411 Generalized anxiety disorder: Secondary | ICD-10-CM | POA: Diagnosis not present

## 2024-05-16 DIAGNOSIS — Z636 Dependent relative needing care at home: Secondary | ICD-10-CM | POA: Diagnosis not present

## 2024-05-16 DIAGNOSIS — Z8659 Personal history of other mental and behavioral disorders: Secondary | ICD-10-CM

## 2024-05-16 NOTE — Progress Notes (Signed)
 Psychotherapy Progress Note [facility] [name], PhD LP  [name])    MRN: [id number] Therapy format: Individual psychotherapy Date: 05/16/2024      Start: 1:04p     Stop: 1:54p     Time Spent: 50 min Location: Telehealth visit -- I connected with this patient by an approved telecommunication method (video), with her informed consent, and verifying identity and patient privacy.  I was located at my office and patient at her home.  As needed, we discussed the limitations, risks, and security and privacy concerns associated with telehealth service, including the availability and conditions which currently govern in-person appointments and the possibility that 3rd-party payment may not be fully guaranteed and she may be responsible for charges.  After she indicated understanding, we proceeded with the session.  Also discussed treatment planning, as needed, including ongoing verbal agreement with the plan, the opportunity to ask and answer all questions, her demonstrated understanding of instructions, and her readiness to call the office should symptoms worsen or she feels she is in a crisis state and needs more immediate and tangible assistance.   Session narrative (presenting needs, interim history, self-report of stressors and symptoms, applications of prior therapy, status changes, and interventions made in session) In a bit of a soul sucking week.  Preparing to go to the beach, ambivalent about it.  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  He had a low b.s. of 51 on the [facility] that did not respond easily to glucose tabs, apple juice, several other measures.  Took over an hour to get above 70, and it turned out he had silenced the <60 alarm.  Suspects he double dosed his insulin by mistake.  Realized in discussion that he missed his Creon (digestive enzyme), which  would necessarily have slowed his absorption.  Considers it a near miss because only noticed his shaking hands for asking him to come see something.    Her own hx of being dx'd hypoglycemic, got down to 38 once, adopted q 2 hr eating schedule and surprised herself come 2020 when she started not eating a lot and did not go into crash.  Interpreted she probably had reactive hypoglycemia (hyperinsulinemia -- matches her aggressive carb habit back then) and that working through PTSD also probably took her through more aggressive physiological responses while working through, before settling better and effectively de-conditioning   Ongoing stress of Sandra Burns's pestering behavior (Mama can we.SABRASABRA?) and whining reminding her of estranged son Sandra Burns as a child.  Have also had a prolonged ordeal setting up a nighttime camera to fulfill doctor's recommendation to see what his sleep behavior is actually like.  Prolonged wait for his punch biopsy to differentiate dementias, with hope that results can guide treatment decisions and maybe validate coverage for further services.  Klonopin -- and separate beds -- both seem to be helping.  He is missing sharing the bed, but it still makes sense to keep to the separateness.  New concern for him cooking, too, with emerging swallowing problems.  Support/validation provided.   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  Enjoys several roles at once right now, and being the one  to document and present people for awards, managing social media, chairing or co-chairing several committees within the chapter and above, all while supporting the chapter president personally (and guarding against the perception of teacher's pet).  Affirmed her service, supported in the reaction that  women can be very catty, well borne out by her experience.  Wishes she could have the frankness with women she's appreciated with men in work roles before.  Concerned for Sandra Burns in her role of imposed-upon grandmother, too.  Affirmed and encouraged in being basic empathic support, asking illuminating questions if interested to motivate her to her own improvements.  Therapeutic modalities: Cognitive Behavioral Therapy, Solution-Oriented/Positive Psychology, and Ego-Supportive  Mental Status/Observations:  Appearance:   Casual     Behavior:  Appropriate  Motor:  Normal  Speech/Language:   Clear and Coherent  Affect:  Appropriate  Mood:  normal and wearied  Thought process:  normal  Thought content:    WNL  Sensory/Perceptual disturbances:    WNL  Orientation:  Fully oriented  Attention:  Good    Concentration:  Good  Memory:  WNL  Insight:    Good  Judgment:   Good  Impulse Control:  Good   Risk Assessment: Danger to Self: No Self-injurious Behavior: No Danger to Others: No Physical Aggression / Violence: No Duty to Warn: No Access to Firearms a concern: No  Assessment of progress:  progressing  Diagnosis:   ICD-10-CM   1. Generalized anxiety disorder (with hx panic, agoraphobia, and PTSD)  F41.1     2. Major depressive disorder, recurrent episode, mild with melancholic features (HCC)  F33.0     3. Caregiver stress  Z63.6     4. Mycobacterium avium complex (HCC)  A31.0     5. History of posttraumatic stress disorder (PTSD)  Z86.59      Plan:  Regrets/trauma hx OK to continue reminiscing, helpful to reach the good with the bad Remember, contextualize, forgive ad lib -- Sandra Burns, parents, and herself Re bereavement and unfinished business with Sandra Burns -- self-affirm bereavement, good work done to reconcile, and constructive beliefs about Sandra Burns's fate and God's care/concern, and continue tasks and needful communication with bereaved family.  Discretion about revealing to SIL  that Sandra Burns had been consulting her in fear and probably misguided concern that he needed to spare her.  Options to talk or write to [facility] in absentia, pray, memorialize as she sees fit, reaffirm forgiveness posthumously. Phobias -- foodborne and airborne illness Reframe the moment she feels like avoiding food as her golden opportunity to say yes and to be in control every bit a much as saying no Continue exposures to perceived risk of contamination she knows is minuscule or well-controlled Reduce arbitrary wait time for food, increase bites taken as a test experiment with broader and deeper intake Use imaginary [place] as coach when facing anxiety  Take food-related dreams as a sign of restoring normal appetite OK to use anorexic videos as inspiration to eat more normally instead  Try smelling spices and/or commitment to reopen a forgotten taste per week as ways to revive appetite rather than rely on moralizing with herself to do as she should Similarly, assess and relax restrictions and protocols on airborne illness, within reason Continue self-help through [name] website ad lib Physical health Evaluate as needed hearing loss Stretch liberally as able for orthopedic/spinal issues Observe reasonable standards for airborne infection control given her lung condition For sleep, continue to establish electronics curfew and willingness to turn in  For weight maintenance, ensure good calorie density in food choices and sufficient quantity For health, ensure good variety, especially adequate vegetable and fruit nutrients, antioxidant content, and wheat OK Caregiver stress 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  It does mean an imposed parenting role until he transitions to professional nursing care. Pursue reasonable  collaborations with his treatment team for cognitive health Seek adequate time away from home and perpetual care -- may be opportunities safe enough, where Sandra Burns is capable of managing some time alone.  Look into respite care and facility and in-home care alternatives for Sandra Burns to get ahead on eventual developments. 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   Assert as needed with friend Sandra Burns.  OK to be direct, keep it about behavior and its better/worse effects.  Try to maintain benevolent interpretation, resist equating her with Sandra Burns or other treacherous personalities.  Assume not knowing before not caring.  Continue willing to ask as needed if anything is amiss, to demonstrate safety to talk, actually. Family strife Tasks of bereavement as indicated Continue stoic approach to son Sandra Burns's resentment/rejection.  Refrain from drastic moves to block, just trust she's strong enough to non-reply if what she gets is actually unfair or aggressive. Self-affirm she can legitimately love the child and fear/guard against the adult he became without it being sinful Other recommendations/advice -- As may be noted above.  Continue to utilize previously learned skills ad lib. Medication compliance -- Maintain medication as prescribed and work faithfully with relevant prescriber(s) if any changes are desired or seem indicated. Crisis service -- Aware of call list and work-in appts.  Call the clinic on-call service, 988/hotline, 911, or present to [facility] or ER if any life-threatening  psychiatric crisis. Followup -- Return for time as already scheduled.  Next scheduled visit with me 06/04/2024.  Next scheduled in this office 06/04/2024.  Lamar Kendall, PhD [name], PhD LP Clinical Psychologist, [facility] [address] [place], [place] [address] [phone]

## 2024-05-20 ENCOUNTER — Ambulatory Visit: Admitting: Infectious Disease

## 2024-06-04 ENCOUNTER — Ambulatory Visit: Admitting: Psychiatry

## 2024-06-04 DIAGNOSIS — F411 Generalized anxiety disorder: Secondary | ICD-10-CM

## 2024-06-04 DIAGNOSIS — F330 Major depressive disorder, recurrent, mild: Secondary | ICD-10-CM | POA: Diagnosis not present

## 2024-06-04 DIAGNOSIS — A310 Pulmonary mycobacterial infection: Secondary | ICD-10-CM | POA: Diagnosis not present

## 2024-06-04 DIAGNOSIS — Z8659 Personal history of other mental and behavioral disorders: Secondary | ICD-10-CM

## 2024-06-04 DIAGNOSIS — Z636 Dependent relative needing care at home: Secondary | ICD-10-CM | POA: Diagnosis not present

## 2024-06-04 NOTE — Progress Notes (Signed)
 Psychotherapy Progress Note [facility] [name], PhD LP  Patient ID: Kita Neace)    MRN: [id number] Therapy format: Individual psychotherapy Date: 06/04/2024      Start: 3:02p     Stop: 4:06p     Time Spent: 64 min Location: Telehealth visit -- I connected with this patient by an approved telecommunication method (video), with her informed consent, and verifying identity and patient privacy.  I was located at my office and patient at her home.  As needed, we discussed the limitations, risks, and security and privacy concerns associated with telehealth service, including the availability and conditions which currently govern in-person appointments and the possibility that 3rd-party payment may not be fully guaranteed and she may be responsible for charges.  After she indicated understanding, we proceeded with the session.  Also discussed treatment planning, as needed, including ongoing verbal agreement with the plan, the opportunity to ask and answer all questions, her demonstrated understanding of instructions, and her readiness to call the office should symptoms worsen or she feels she is in a crisis state and needs more immediate and tangible assistance.   Session narrative (presenting needs, interim history, self-report of stressors and symptoms, applications of prior therapy, status changes, and interventions made in session) Pt away from computer when Tx logged on, arrived shortly, needed time to go clear something in her eye.    Challenging time with allergies (early, strong ragweed season) and responsibilities with [name] book club, etc.  Had beach vacation with [name], who was overstimulated at first and hit low b.s.  (Alarm on, thankfully.)  Notes he has a shrunken list of things he eats -- her food palette is actually larger now -- so he got challenged by chicken breast he usually doesn't have, and raw onions, and progressed to projectile vomiting, had to  figure out whether he had food poisoning or a blockage (quite likely given his hx of adhesions and abdominal pseudocysts).  Had medical attention down there, suspicion of a high blockage, took a calculated risk to come home, sick in the car, turned out to be most likely that but he vomited so effectively he didn't need emergency attention, just overloaded his narrowed upper GI tract.  He has wanted a new CT scan, but dr keeps saying no good reason.  Suggested he may really want to see live action what happens with his gut, or an illustration, e.g., see what Schatzki rings looks like and how they function, extrapolate from there.  Herself, notes she was found to have multiple ulcers and precancerous stomach polyps a couple years ago.  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    Notes she's puzzled why she hasn't developed a psychological dependency on Xanax .  Reminded that she is an unusually determined personality -- one of her trauma-conditioned issues, perhaps -- to not be owned, whether it's a person or a pill.  In fact, she has often enough worked through self-set dose alarms and forgotten to take as scheduled, so if anything, she is extra protected.  Admittedly, it is ego dystonic for her to be taking a sedative, even though she's had multiple pushers in life among prior physicians, and experience with one former psychiatrist actively trying to run up the dose, in her opinion.  Discussed parameters of use and ideas on eventually weaning off it.  Turns out she has naturally reduced parts of her 4/day  dosing already anyway.  Most logical way to wean would be to knock half-doses off 1 at a time and see ho she adjusts, but refer to psychiatry for collaborative decision-making.  Possible even that she could go through a course of off-label Campral once she does wean, on the theory that longterm use has proliferated glutamate  receptors, same as alcohol does.  Lastly, she has found a LBD caregiver support group.  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  Assorted concerns with him not making good use of CPAP, and unrealistically wanting to get a hypoglossal stimulator instead (swallowing issues preclude).  He is showing more obsessions as well, and now pestering for sex that will not happen and has been long gone from their marriage.  Affirmed and encouraged in constructive limit-setting.  Running over, she wants to bookmark discussion of her mother's influence and anxiety next time.  Therapeutic modalities: Cognitive Behavioral Therapy, Solution-Oriented/Positive Psychology, and Faith-sensitive  Mental Status/Observations:  Appearance:   Casual     Behavior:  Appropriate  Motor:  Normal  Speech/Language:   Clear and Coherent  Affect:  Appropriate  Mood:  dysthymic, responsive  Thought process:  normal  Thought content:    WNL  Sensory/Perceptual disturbances:    WNL  Orientation:  Fully oriented  Attention:  Good    Concentration:  Good  Memory:  WNL  Insight:    Good  Judgment:   Good  Impulse Control:  Good   Risk Assessment: Danger to Self: No Self-injurious Behavior: No Danger to Others: No Physical Aggression / Violence: No Duty to Warn: No Access to Firearms a concern: No  Assessment of progress:  progressing  Diagnosis:   ICD-10-CM   1. Major depressive disorder, recurrent episode, mild with melancholic features (HCC)  F33.0     2. Generalized anxiety disorder (with hx panic, agoraphobia, and PTSD)  F41.1     3. Caregiver stress  Z63.6     4. Mycobacterium avium complex (HCC)  A31.0     5. History of posttraumatic stress disorder (PTSD)  Z86.59      Plan:  Regrets/trauma hx OK to continue reminiscing, helpful to reach the good with the bad Remember,  contextualize, forgive ad lib -- [name], parents, and herself Re bereavement and unfinished business with [name] self-affirm bereavement, good work done to reconcile, and constructive beliefs about [name] fate and God's care/concern, and continue tasks and needful communication with bereaved family.  Discretion about revealing to SIL that [name] in fear and probably misguided concern that he needed to spare her.  Options to talk or write to [facility] in absentia, pray, memorialize as she sees fit, reaffirm forgiveness posthumously. Phobias -- foodborne and airborne illness Reframe the moment she feels like avoiding food as her golden opportunity to say yes and to be in control every bit a much as saying no Continue exposures to perceived risk of contamination she knows is minuscule or well-controlled Reduce arbitrary wait time for food, increase bites taken as a test experiment with broader and deeper intake Use imaginary [place] as coach when facing anxiety  Take food-related dreams as a sign of restoring normal appetite OK to use anorexic videos as inspiration to eat more normally instead  Try smelling spices and/or commitment to reopen a forgotten taste per week as ways to revive appetite rather than rely on moralizing  with herself to do as she should Similarly, assess and relax restrictions and protocols on airborne illness, within reason Continue self-help through [name] website ad lib Physical health Evaluate as needed hearing loss Stretch liberally as able for orthopedic/spinal issues Observe reasonable standards for airborne infection control given her lung condition For sleep, continue to establish electronics curfew and willingness to turn in For weight maintenance, ensure good calorie density in food choices and sufficient quantity For health, ensure good variety, especially adequate vegetable and fruit nutrients, antioxidant content, and wheat  OK Caregiver stress 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  It does mean an imposed parenting role until he transitions to professional nursing care. Pursue reasonable collaborations with his treatment team for cognitive health Seek adequate time away from home and perpetual care -- may be opportunities safe enough, where [name] is capable of managing some time alone.  Look into respite care and facility and in-home care alternatives for [name] to get ahead on eventual developments. 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   Assert as needed with friend [name].  OK to be direct, keep it about behavior and its better/worse effects.  Try to maintain benevolent interpretation, resist equating her with [name] or other treacherous personalities.  Assume not knowing before not caring.  Continue willing to ask as needed if anything is amiss, to demonstrate safety to talk, actually. Family strife Tasks of bereavement as indicated Continue stoic approach to son [name] resentment/rejection.  Refrain from drastic moves to block, just trust she's strong enough to non-reply if what she gets is actually unfair or aggressive. Self-affirm she can legitimately love the child and fear/guard against the adult he became without it being sinful Other  recommendations/advice -- As may be noted above.  Continue to utilize previously learned skills ad lib. Medication compliance -- Maintain medication as prescribed and work faithfully with relevant prescriber(s) if any changes are desired or seem indicated. Crisis service -- Aware of call list and work-in appts.  Call the clinic on-call service, 988/hotline, 911, or present to [facility] or ER if any life-threatening psychiatric crisis. Followup -- Return for time as already scheduled.  Next scheduled visit with me 06/18/2024.  Next scheduled in this office 06/18/2024.  Lamar Kendall, PhD [name], PhD LP Clinical Psychologist, [facility] [address] [place], [place] [address] [phone]

## 2024-06-17 ENCOUNTER — Ambulatory Visit: Admitting: Infectious Disease

## 2024-06-18 ENCOUNTER — Ambulatory Visit (INDEPENDENT_AMBULATORY_CARE_PROVIDER_SITE_OTHER): Admitting: Psychiatry

## 2024-06-18 DIAGNOSIS — A310 Pulmonary mycobacterial infection: Secondary | ICD-10-CM

## 2024-06-18 DIAGNOSIS — F411 Generalized anxiety disorder: Secondary | ICD-10-CM

## 2024-06-18 DIAGNOSIS — Z630 Problems in relationship with spouse or partner: Secondary | ICD-10-CM

## 2024-06-18 DIAGNOSIS — R69 Illness, unspecified: Secondary | ICD-10-CM

## 2024-06-18 DIAGNOSIS — Z636 Dependent relative needing care at home: Secondary | ICD-10-CM

## 2024-06-18 DIAGNOSIS — F330 Major depressive disorder, recurrent, mild: Secondary | ICD-10-CM | POA: Diagnosis not present

## 2024-06-18 DIAGNOSIS — Z8659 Personal history of other mental and behavioral disorders: Secondary | ICD-10-CM

## 2024-06-18 DIAGNOSIS — Z889 Allergy status to unspecified drugs, medicaments and biological substances status: Secondary | ICD-10-CM

## 2024-06-18 NOTE — Progress Notes (Signed)
 Psychotherapy Progress Note [facility] [name], PhD LP  [name])    MRN: [id number] Therapy format: Individual psychotherapy Date: 06/18/2024      Start: 2:10p     Stop: 3:16p     Time Spent: 66 min Location: Telehealth visit -- I connected with this patient by an approved telecommunication method (video), with her informed consent, and verifying identity and patient privacy.  I was located at my office and patient at her home.  As needed, we discussed the limitations, risks, and security and privacy concerns associated with telehealth service, including the availability and conditions which currently govern in-person appointments and the possibility that 3rd-party payment may not be fully guaranteed and she may be responsible for charges.  After she indicated understanding, we proceeded with the session.  Also discussed treatment planning, as needed, including ongoing verbal agreement with the plan, the opportunity to ask and answer all questions, her demonstrated understanding of instructions, and her readiness to call the office should symptoms worsen or she feels she is in a crisis state and needs more immediate and tangible assistance.   Session narrative (presenting needs, interim history, self-report of stressors and symptoms, applications of prior therapy, status changes, and interventions made in session) Video connection on but Pt out of the room when arriving.  C/o probably very sensitive allergy response (ragweed) after going out yesterday.  Also first time [name] wandered off, alleged he was avoiding a clipboard-toting employee, and trying to decide whether to put a couple items in a bag.  Not true wandering, more at disinhibition.  Admits she's struggling to control her irritability with him.  Ironically, both are interested now in 3rd party home help and respite care, although [name] managed to tell her she's not stable and she's  mean, which is at least vaguely offensive.  While her potential overcommitment has had her feeling worse off, in more pain from multiple pain sites.  C/o [name] making more impulsive decisions and trying not to interrupt her and making a bad judgment on his own.  He's also wearing out faster on tasks he sets himself to, and trying to learn to ask her before making any decisions.  In large part, any hot idea he gets, she has to wind up working the plan because of his impatience, his hearing, his comprehension, and went through a slew of these at the very time she de-scheduled herself from an overload of [name] matters.  He's also continuing some coarse sexual comments sometimes, needed to get backed off, and educated that it sounds disrespectful.  Admittedly soul crushing to be on duty 24/7 watching out for problems.  Challenged to figure out which she would rather assume when she can't tell if [name] is being demented or difficult.  Has been sampling online support groups, and has revealed how he quit CPAP and temporarily stopped brushing his teeth.  She has gotten hygiene back on track.  Further issues now with potentially huge Medicare changes coming.    [facility] on [facility] personal care and respite care.  Briefly educated on varieties and scalable help.  After session, addressed via email possible telehealth in-person requirement which may go into effect next week, depending.  Unclear whether her BCBS Medicare alternative plan will require, advised to check with her insurance directly, and worst case, we can write up exception based on respiratory illness risk.    Therapeutic modalities: Cognitive Behavioral Therapy,  Solution-Oriented/Positive Psychology, and Ego-Supportive  Mental Status/Observations:  Appearance:   Casual     Behavior:  Appropriate  Motor:  Normal and slowed  Speech/Language:   Clear and Coherent and somewhat weakened by allergic response   Affect:  Appropriate  Mood:  dysthymic  Thought process:  normal  Thought content:    WNL  Sensory/Perceptual disturbances:    WNL  Orientation:  Fully oriented  Attention:  Good    Concentration:  Good  Memory:  WNL  Insight:    Good  Judgment:   Good  Impulse Control:  Good   Risk Assessment: Danger to Self: No Self-injurious Behavior: No Danger to Others: No Physical Aggression / Violence: No Duty to Warn: No Access to Firearms a concern: No  Assessment of progress:  situational setback(s)  Diagnosis:   ICD-10-CM   1. Major depressive disorder, recurrent episode, mild with melancholic features  F33.0     2. Generalized anxiety disorder (with hx panic, agoraphobia, and PTSD)  F41.1     3. Caregiver stress  Z63.6     4. History of posttraumatic stress disorder (PTSD)  Z86.59     5. Relationship problem between partners  Z63.0     6. r/o Lupus (SLE) vs. fibromyalgia vs. unspecified complex AI disorder  R69     7. Mycobacterium avium complex (HCC)  A31.0     8. Multiple allergies, with complex chronic pulmonary disease, and multiple immune illnesses and high risk of disabling infection  Z88.9      Plan:  Regrets/trauma hx OK to continue reminiscing, helpful to reach the good with the bad Remember, contextualize, forgive ad lib -- [name], parents, and herself Re bereavement and unfinished business with [name] self-affirm bereavement, good work done to reconcile, and constructive beliefs about [name] fate and God's care/concern, and continue tasks and needful communication with bereaved family.  Discretion about revealing to SIL that [name] in fear and probably misguided concern that he needed to spare her.  Options to talk or write to [facility] in absentia, pray, memorialize as she sees fit, reaffirm forgiveness posthumously. Phobias -- foodborne and airborne illness Reframe the moment she feels like avoiding food as her golden opportunity to  say yes and to be in control every bit a much as saying no Continue exposures to perceived risk of contamination she knows is minuscule or well-controlled Reduce arbitrary wait time for food, increase bites taken as a test experiment with broader and deeper intake Use imaginary [place] as coach when facing anxiety  Take food-related dreams as a sign of restoring normal appetite OK to use anorexic videos as inspiration to eat more normally instead  Try smelling spices and/or commitment to reopen a forgotten taste per week as ways to revive appetite rather than rely on moralizing with herself to do as she should Similarly, assess and relax restrictions and protocols on airborne illness, within reason Continue self-help through [name] website ad lib Physical health Evaluate as needed hearing loss Stretch liberally as able for orthopedic/spinal issues Observe reasonable standards for airborne infection control given her lung condition For sleep, continue to establish electronics curfew and willingness to turn in For weight maintenance, ensure good calorie density in food choices and sufficient quantity For health, ensure good variety, especially adequate vegetable and fruit nutrients, antioxidant content, and wheat OK Caregiver stress 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  It does mean an imposed parenting role until he transitions to professional nursing care. Pursue reasonable collaborations with his treatment team for cognitive health Seek adequate time away from home and perpetual care -- may be opportunities safe enough, where [name] is capable of managing some time alone.  Look into respite care and facility and in-home care alternatives for [name] to get ahead on eventual developments. Make use as desired of  caregiver support organization(s) As long as his history of infidelity is not worth addressing explicitly, then view forgiveness as merely a way to unburden herself of resentment, not a pledge that nothing ever broke between them Social support Work assertiveness with friends to get adequate time listened to, not just falling into the automatic listener role Continuing discretion disclosing to friends and asking support and her turn  Manage her work/service and her limits with [name] and other organizations.   Assert as needed with friend [name].  OK to be direct, keep it about behavior and its better/worse effects.  Try to maintain benevolent interpretation, resist equating her with [name] or other treacherous personalities.  Assume not knowing before not caring.  Continue willing to ask as needed if anything is amiss, to demonstrate safety to talk, actually. Family strife Tasks of bereavement as indicated Continue stoic approach to son [name] resentment/rejection.  Refrain from drastic moves to block, just trust she's strong enough to non-reply if what she gets is actually unfair or aggressive. Self-affirm she can legitimately love the child and fear/guard against the adult he became without it being sinful Other recommendations/advice -- As may be noted above.  Continue to utilize previously learned skills ad lib. Medication compliance -- Maintain medication as prescribed and work faithfully with relevant prescriber(s) if any changes are desired or seem indicated. Crisis service -- Aware of call list and work-in appts.  Call the clinic on-call service, 988/hotline, 911, or present to [facility] or ER if any life-threatening psychiatric crisis. Followup -- Return for time as already scheduled.  Next scheduled visit with me 07/09/2024.  Next scheduled in this office 07/09/2024.  Lamar Kendall, PhD [name], PhD LP Clinical Psychologist, [facility] [address] [place], [place] [address] [phone]

## 2024-07-09 ENCOUNTER — Ambulatory Visit (INDEPENDENT_AMBULATORY_CARE_PROVIDER_SITE_OTHER): Admitting: Psychiatry

## 2024-07-09 DIAGNOSIS — Z630 Problems in relationship with spouse or partner: Secondary | ICD-10-CM

## 2024-07-09 DIAGNOSIS — F330 Major depressive disorder, recurrent, mild: Secondary | ICD-10-CM

## 2024-07-09 DIAGNOSIS — A310 Pulmonary mycobacterial infection: Secondary | ICD-10-CM | POA: Diagnosis not present

## 2024-07-09 DIAGNOSIS — Z8659 Personal history of other mental and behavioral disorders: Secondary | ICD-10-CM

## 2024-07-09 DIAGNOSIS — F411 Generalized anxiety disorder: Secondary | ICD-10-CM

## 2024-07-09 DIAGNOSIS — R69 Illness, unspecified: Secondary | ICD-10-CM

## 2024-07-09 DIAGNOSIS — Z636 Dependent relative needing care at home: Secondary | ICD-10-CM

## 2024-07-09 NOTE — Progress Notes (Signed)
 Psychotherapy Progress Note [facility] Sandra Kendall, PhD LP  [name])    MRN: [id number] Therapy format: Individual psychotherapy Date: 07/09/2024      Start: 2:18p     Stop: 3:19p     Time Spent: 61 min Location: Telehealth visit -- I connected with this patient by an approved telecommunication method (video), with her informed consent, and verifying identity and patient privacy.  I was located at my office and patient at her home.  As needed, we discussed the limitations, risks, and security and privacy concerns associated with telehealth service, including the availability and conditions which currently govern in-person appointments and the possibility that 3rd-party payment may not be fully guaranteed and she may be responsible for charges.  After she indicated understanding, we proceeded with the session.  Also discussed treatment planning, as needed, including ongoing verbal agreement with the plan, the opportunity to ask and answer all questions, her demonstrated understanding of instructions, and her readiness to call the office should symptoms worsen or she feels she is in a crisis state and needs more immediate and tangible assistance.   Session narrative (presenting needs, interim history, self-report of stressors and symptoms, applications of prior therapy, status changes, and interventions made in session) Fatigued today.  Sandra Burns went on a short trip to grocery today and took way too long to come back, turned out the car battery quit.  To his credit, he cogently called roadside assistance, but it shook her up, and she got irritable with him earlier.  Has joined a LBD caregiver support group, found it welcoming, warm, validating, and informative.  Trusts the leaders, who is herself widowed at this point.  Been dealing with Sandra Burns hitting low b.s. lately, but he did have a cogent plan.  Continuing care for LBD, seeing neuro next month,  getting good camera footage of his REM sleep disorder with  a field cam.    Dreamt about not having any friends.  Thinks it has to do with a couple of abrasive moments with friends lately.  One, Sandra Burns come over unexpectedly, banging on the door and barging in when Sandra Burns answered the door.  Apparently she left a message about dropping by with food from [employer] fry, and intent on delivering.  It was rather disruptive, and brusque of her.  Allowed to narrate at some length, endorsed her response to her.  No new c/o friend Sandra Burns, who is a fellow patient, nor new issues with serving multiple roles in [place].  Therapeutic modalities: Cognitive Behavioral Therapy, Solution-Oriented/Positive Psychology, and Ego-Supportive  Mental Status/Observations:  Appearance:   Casual     Behavior:  Appropriate  Motor:  Normal  Speech/Language:   Clear and Coherent  Affect:  Appropriate  Mood:  wearied  Thought process:  normal  Thought content:    WNL  Sensory/Perceptual disturbances:    WNL  Orientation:  Fully oriented  Attention:  Good    Concentration:  Good  Memory:  WNL  Insight:    Good  Judgment:   Good  Impulse Control:  Good   Risk Assessment: Danger to Self: No Self-injurious Behavior: No Danger to Others: No Physical Aggression / Violence: No Duty to Warn: No Access to Firearms a concern: No  Assessment of progress:  progressing  Diagnosis:   ICD-10-CM   1. Major depressive disorder, recurrent episode, mild with melancholic features  F33.0     2. Generalized anxiety disorder (with hx panic, agoraphobia, and  PTSD)  F41.1     3. Caregiver stress  Z63.6     4. History of posttraumatic stress disorder (PTSD)  Z86.59     5. Relationship problem between partners  Z63.0     6. r/o Lupus (SLE) vs. fibromyalgia vs. unspecified complex AI disorder  R69     7. Mycobacterium avium complex (HCC)  A31.0      Plan:  Regrets/trauma hx OK to continue reminiscing, helpful to reach the  good with the bad Remember, contextualize, forgive ad lib -- Sandra Burns, parents, and herself Re bereavement and unfinished business with Sandra Burns -- self-affirm bereavement, good work done to reconcile, and constructive beliefs about Sandra Burns's fate and God's care/concern, and continue tasks and needful communication with bereaved family.  Discretion about revealing to Sandra Burns that Sandra Burns had been consulting her in fear and probably misguided concern that he needed to spare her.  Options to talk or write to [facility] in absentia, pray, memorialize as she sees fit, reaffirm forgiveness posthumously. Phobias -- foodborne and airborne illness Reframe the moment she feels like avoiding food as her golden opportunity to say yes and to be in control every bit a much as saying no Continue exposures to perceived risk of contamination she knows is minuscule or well-controlled Reduce arbitrary wait time for food, increase bites taken as a test experiment with broader and deeper intake Use imaginary [place] as coach when facing anxiety  Take food-related dreams as a sign of restoring normal appetite OK to use anorexic videos as inspiration to eat more normally instead  Try smelling spices and/or commitment to reopen a forgotten taste per week as ways to revive appetite rather than rely on moralizing with herself to do as she should Similarly, assess and relax restrictions and protocols on airborne illness, within reason Continue self-help through [name] website ad lib Physical health Evaluate as needed hearing loss Stretch liberally as able for orthopedic/spinal issues Observe reasonable standards for airborne infection control given her lung condition For sleep, continue to establish electronics curfew and willingness to turn in For weight maintenance, ensure good calorie density in food choices and sufficient quantity For health, ensure good variety, especially adequate vegetable and fruit nutrients,  antioxidant content, and wheat OK Caregiver stress 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  It does mean an imposed parenting role until he transitions to professional nursing care. Pursue reasonable collaborations with his treatment team for cognitive health Seek adequate time away from home and perpetual care -- may be opportunities safe enough, where Sandra Burns is capable of managing some time alone.  Look into respite care and facility and in-home care alternatives for Sandra Burns to get ahead on eventual developments. 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   Assert as needed with friend Sandra Burns.  OK to be direct, keep it about behavior and its better/worse effects.  Try to maintain benevolent interpretation, resist equating her with [name]  or other treacherous personalities.  Assume not knowing before not caring.  Continue willing to ask as needed if anything is amiss, to demonstrate safety to talk, actually. Family strife Tasks of bereavement as indicated Continue stoic approach to son [name] resentment/rejection.  Refrain from drastic moves to block, just trust she's strong enough to non-reply if what she gets is actually unfair or aggressive. Self-affirm she can legitimately love the child and fear/guard against the adult he became without it  being sinful Other recommendations/advice -- As may be noted above.  Continue to utilize previously learned skills ad lib. Medication compliance -- Maintain medication as prescribed and work faithfully with relevant prescriber(s) if any changes are desired or seem indicated. Crisis service -- Aware of call list and work-in appts.  Call the clinic on-call service, 988/hotline, 911, or present to [facility] or ER if any life-threatening psychiatric crisis. Followup -- Return for time as already scheduled.  Next scheduled visit with me 07/25/2024.  Next scheduled in this office 07/25/2024.  Lamar Kendall, PhD Sandra Kendall, PhD LP Clinical Psychologist, [facility] [address] [place], [place] [address] [phone]

## 2024-07-25 ENCOUNTER — Ambulatory Visit: Admitting: Psychiatry

## 2024-07-25 DIAGNOSIS — Z8659 Personal history of other mental and behavioral disorders: Secondary | ICD-10-CM

## 2024-07-25 DIAGNOSIS — F411 Generalized anxiety disorder: Secondary | ICD-10-CM

## 2024-07-25 DIAGNOSIS — G8929 Other chronic pain: Secondary | ICD-10-CM

## 2024-07-25 DIAGNOSIS — Z636 Dependent relative needing care at home: Secondary | ICD-10-CM

## 2024-07-25 DIAGNOSIS — M5440 Lumbago with sciatica, unspecified side: Secondary | ICD-10-CM

## 2024-07-25 DIAGNOSIS — F330 Major depressive disorder, recurrent, mild: Secondary | ICD-10-CM

## 2024-07-25 NOTE — Progress Notes (Signed)
 Psychotherapy Progress Note [facility] [name], PhD LP  [name] [facility])    MRN: [id number] Therapy format: Individual psychotherapy Date: 07/25/2024      Start: 2:12p     Stop: 3:15p     Time Spent: 65 min Location: Telehealth visit -- I connected with this patient by an approved telecommunication method (video), with her informed consent, and verifying identity and patient privacy.  I was located at my office and patient at her home.  As needed, we discussed the limitations, risks, and security and privacy concerns associated with telehealth service, including the availability and conditions which currently govern in-person appointments and the possibility that 3rd-party payment may not be fully guaranteed and she may be responsible for charges.  After she indicated understanding, we proceeded with the session.  Also discussed treatment planning, as needed, including ongoing verbal agreement with the plan, the opportunity to ask and answer all questions, her demonstrated understanding of instructions, and her readiness to call the office should symptoms worsen or she feels she is in a crisis state and needs more immediate and tangible assistance.   Session narrative (presenting needs, interim history, self-report of stressors and symptoms, applications of prior therapy, status changes, and interventions made in session) Good news that Sandra Burns agreed to a participate in a dementia support group on [place] and seemed to enjoy socializing with a couple of females.  Gently encouraging him that he will make friends, broaden his own support, and have his own space independent of her.  [name], for her part, has settled into her own caregiver support group, has numbers of other women, primarily.    Enjoyed a new thriller, [place] [facility] of [address] on Netflix.  Contrasting grief reminders lately. 1 year passed since Sandra Burns's death.  In touch with Sandra Burns still,  successfully able to both acknowledge missing him, remember the good, and deal with other aspects of living and family.  26th anniversary of her best friend Sandra Burns's death from a very rare, race-linked cancer, in the context of a drug-addicted daughter, an estranged husband living with her, and Sandra Burns serving as a sister-like support.  Notable memory of being the one who risked it to convince her to enter the Hospice home, at a time  -- to relieve multiple pressures in family, her own agitation, and her A's unwise effort to feed her.  Describes a dear picture in the bedroom, taken 2 wks before death, that embodies the love she had for her, and the practice of visiting her beach grave when they go.  Pleasant memories of helping out her daughter Sandra Burns, who was 28 when she fell ill, 14 when she died.  Affirmed lessons in grief that (a) it's OK to remember it all, the painful and the pleasant, and (b) it's OK to choose what to dwell on.  Unfortunately, Sandra Burns has drifted away over the years, but she did get the chance to tell her richly, in memoir chapters, of their friendship over the decades from childhood.    Morale relatively good.  Does not regard herself depressed, per se, just in ongoing caregiver stress.  Enjoying genealogy, including the find that her angry friend Sandra Burns's 5th GGF was a friend of her 5th GGF and his executor in 1771, and they have relatives who married, and therefore other relatives who share DNA.  Musing about whether DNA has memory and recognition beyond conscious memory.  Constant pain at this point, below her back fusion, with known  herniated discs and scoliosis.  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  Clear she does not want opioids.  Informed briefly of a new, non-opioid med, Journavx.  Therapeutic modalities: Cognitive Behavioral Therapy, Solution-Oriented/Positive Psychology, Ego-Supportive, and Humanistic/Existential  Mental Status/Observations:  Appearance:    Casual     Behavior:  Appropriate  Motor:  Normal  Speech/Language:   Clear and Coherent  Affect:  Appropriate  Mood:  Appropriate to subject, responsive  Thought process:  normal and detailed  Thought content:    WNL  Sensory/Perceptual disturbances:    WNL  Orientation:  Fully oriented  Attention:  Good    Concentration:  Good  Memory:  WNL  Insight:    Good  Judgment:   Good  Impulse Control:  Good   Risk Assessment: Danger to Self: No Self-injurious Behavior: No Danger to Others: No Physical Aggression / Violence: No Duty to Warn: No Access to Firearms a concern: No  Assessment of progress:  progressing  Diagnosis:   ICD-10-CM   1. Major depressive disorder, recurrent episode, mild with melancholic features  F33.0     2. Generalized anxiety disorder (with hx panic, agoraphobia, and PTSD)  F41.1     3. Caregiver stress  Z63.6     4. History of posttraumatic stress disorder (PTSD)  Z86.59     5. Chronic midline low back pain with sciatica, sciatica laterality unspecified, with hx of extensive scoliosis surgery  M54.40    G89.29      Plan:  Regrets/trauma hx OK to continue reminiscing, helpful to reach the good with the bad Remember, contextualize, forgive ad lib -- Sandra Burns, parents, and herself Re bereavement and unfinished business with Sandra Burns -- self-affirm bereavement, good work done to reconcile, and constructive beliefs about Sandra Burns's fate and God's care/concern, and continue tasks and needful communication with bereaved family.  Discretion about revealing to SIL that Sandra Burns had been consulting her in fear and probably misguided concern that he needed to spare her.  Options to talk or write to [facility] in absentia, pray, memorialize as she sees fit, reaffirm forgiveness posthumously. Phobias -- foodborne and airborne illness Reframe the moment she feels like avoiding food as her golden opportunity to say yes and to be in control every bit a much as saying  no Continue exposures to perceived risk of contamination she knows is minuscule or well-controlled Reduce arbitrary wait time for food, increase bites taken as a test experiment with broader and deeper intake Use imaginary [place] as coach when facing anxiety  Take food-related dreams as a sign of restoring normal appetite OK to use anorexic videos as inspiration to eat more normally instead  Try smelling spices and/or commitment to reopen a forgotten taste per week as ways to revive appetite rather than rely on moralizing with herself to do as she should Similarly, assess and relax restrictions and protocols on airborne illness, within reason Continue self-help through [name] website ad lib Physical health Evaluate as needed hearing loss Stretch liberally as able for orthopedic/spinal issues Observe reasonable standards for airborne infection control given her lung condition For sleep, continue to establish electronics curfew and willingness to turn in For weight maintenance, ensure good calorie density in food choices and sufficient quantity For health, ensure good variety, especially adequate vegetable and fruit nutrients, antioxidant content, and wheat OK Caregiver stress Concur with safety measures for REM Sleep Behavior Disorder and LBD Continue reframing PRN [name]'s inconvenient behaviors as his second childhood, not her  re-raising [name] or revisiting her put-upon childhood.  It does mean an imposed parenting role until he transitions to professional nursing care. Pursue reasonable collaborations with his treatment team for cognitive health Seek adequate time away from home and perpetual care -- may be opportunities safe enough, where Sandra Burns is capable of managing some time alone.  Look into respite care and facility and in-home care alternatives for Sandra Burns to get ahead on eventual developments. Make use as desired of caregiver support organization(s) As long as his history  of infidelity is not worth addressing explicitly, then view forgiveness as merely a way to unburden herself of resentment, not a pledge that nothing ever broke between them Social support Work assertiveness with friends to get adequate time listened to, not just falling into the automatic listener role Continuing discretion disclosing to friends and asking support and her turn  Manage her work/service and her limits with [name] and other organizations.   Assert as needed with friend [name].  OK to be direct, keep it about behavior and its better/worse effects.  Try to maintain benevolent interpretation, resist equating her with [name] or other treacherous personalities.  Assume not knowing before not caring.  Continue willing to ask as needed if anything is amiss, to demonstrate safety to talk, actually. Family strife Tasks of bereavement as indicated Continue stoic approach to son [name] resentment/rejection.  Refrain from drastic moves to block, just trust she's strong enough to non-reply if what she gets is actually unfair or aggressive. Self-affirm she can legitimately love the child and fear/guard against the adult he became without it being sinful Other recommendations/advice -- As may be noted above.  Continue to utilize previously learned skills ad lib. Medication compliance -- Maintain medication as prescribed and work faithfully with relevant prescriber(s) if any changes are desired or seem indicated. Crisis service -- Aware of call list and work-in appts.  Call the clinic on-call service, 988/hotline, 911, or present to [name] or ER if any life-threatening psychiatric crisis. Followup -- Return for time as already scheduled, avail earlier @ PT's need, put on [place] list.  Next scheduled visit with me 09/06/2024.  Next scheduled in this office 09/06/2024.  Lamar Kendall, PhD [name], PhD LP Clinical Psychologist, [facility] [address] [place], [place] [address] [phone]

## 2024-07-29 NOTE — Progress Notes (Deleted)
 Subjective:  Chief Complaint: followup for M avium colonization, infection, bronchiectasis   [name], female    DOB: [birth year], 68 y.o.   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   Mycobacterium avium complex colonization 02/21/2024   Osteoporosis    Paroxysmal A-fib (HCC)    Psoriatic arthritis (HCC)    Raynaud's disease    Reactive airway disease    S/P chemotherapy, time since greater than 12 weeks 1984   cervical ca   Seizure (HCC)    Sjogren's syndrome    Vaccine counseling 12/09/2020   Weight loss 11/10/2021    Past Surgical History:  Procedure Laterality Date   ABDOMINAL HYSTERECTOMY     BACK SURGERY     BREAST BIOPSY Left 20+ YRS AGO   EXCISIONAL - NEG   CHOLECYSTECTOMY     ESOPHAGOGASTRODUODENOSCOPY N/A 11/11/2021   Procedure: ESOPHAGOGASTRODUODENOSCOPY (EGD);  Surgeon: Onita Elspeth Sharper, DO;  Location: [facility] ENDOSCOPY;  Service: Gastroenterology;  Laterality: N/A;   OOPHORECTOMY     TUBAL LIGATION      Family History  Problem Relation Age of Onset   Lupus Mother    Breast cancer Neg Hx       Social History   Socioeconomic History   Marital status: Married    Spouse name: Not on file   Number of children: Not on file   Years of education: Not on file   Highest education level: Not on file  Occupational History   Occupation: RN    Comment: Disabled since  '09 Cardiopulmonary reasons  Tobacco Use   Smoking status: Former    Current packs/day: 0.00    Types: Cigarettes    Quit date: 07/13/1981    Years since quitting: 43.0    Passive exposure: Past   Smokeless tobacco: Never  Vaping Use   Vaping status: Never Used  Substance and Sexual Activity   Alcohol use: Not Currently    Comment: drank as a teen.  Not since 1990   Drug use: Not Currently    Comment: pot as a teen   Sexual activity: Not on file  Other Topics Concern   Not on file  Social History Narrative   Not on file   Social Drivers of Health   Financial Resource Strain: Low Risk  (02/13/2024)   Received from [facility]   Overall Financial Resource Strain (CARDIA)    Difficulty of Paying Living Expenses: Not hard at all  Food Insecurity: No Food Insecurity (02/13/2024)   Received from [facility]   Hunger Vital Sign    Within the past 12 months, you worried that your food would run out before you got the money to buy more.: Never true    Within the past 12 months, the  food you bought just didn't last and you didn't have money to get more.: Never true  Transportation Needs: No Transportation Needs (02/13/2024)   Received from [facility] - Transportation    In the past 12 months, has lack of transportation kept you from medical appointments or from getting medications?: No    Lack of Transportation (Non-Medical): No  Physical Activity: Insufficiently Active (09/29/2021)   Received from [facility]   Exercise Vital Sign    On average, how many days per week do you engage in moderate to strenuous exercise (like a brisk walk)?: 1 day    On average, how many minutes do you engage in exercise at this level?: 10 min  Stress: No Stress Concern Present (08/09/2023)   Received from [facility] - Occupational Stress Questionnaire    Feeling of Stress : Not  at all  Social Connections: Unknown (02/06/2022)   Received from [facility]   Social Network    Social Network: Not on file    Allergies  Allergen Reactions   Albuterol Other (See Comments) and Palpitations    Atrial fibrillation Atrial fibrillation    Cephalosporins Itching and Rash    Rash and itching   Flagyl [Metronidazole] Dermatitis and Other (See Comments)    Other Reaction: SKIN SLOUGHING   Macrobid [Nitrofurantoin Macrocrystal] Itching   Neomy-Bacit-Polymyx-Pramoxine Itching and Swelling    Pt got the issue for the eye lids after instill the eyes drops.    Penicillins Anaphylaxis   Sulfa Antibiotics Itching   Vancomycin Shortness Of Breath    Other reaction(s): Unknown   Vicodin [Hydrocodone-Acetaminophen] Shortness Of Breath   Amoxicillin Itching and Swelling    She had intense itching and a rash see updated answers to questions below  Has patient had a PCN reaction causing immediate rash, facial/tongue/throat swelling, SOB or lightheadedness with hypotension: No Has patient had a PCN reaction causing severe rash involving mucus membranes or skin necrosis: No Has patient had a PCN reaction that required hospitalization: No Has patient had a PCN reaction occurring within the last 10 years: No If all of the above answers are NO, then may proceed with Ceph   Ciprofloxacin Swelling    Arm swelled up bright red , rash, itching   Latex Rash   Bacitracin    Bacitracin-Neomycin-Polymyxin Itching and Swelling    Pt got the issue for the eye lids after instill the eyes drops.    Barium-Containing Compounds    Nitrofurantoin     Other reaction(s): Unknown   Tobramycin Itching and Swelling    Pt got the issue after instill the eyes drops   Decongest-Aid [Pseudoephedrine] Palpitations   Eggshell Membrane (Chicken) [Egg Shells] Rash   Hydrocodone-Acetaminophen Itching   Levofloxacin Rash    Itchy, swelling   Other Palpitations    BARIUM CONTRAST   Prednisone  Palpitations   Zithromax [Azithromycin] Other (See Comments)    GI upset, C-diff     Current Outpatient Medications:    acetaminophen (TYLENOL) 500 MG tablet, Take 500 mg by mouth every 6 (six) hours as needed., Disp: , Rfl:    ALPRAZolam  (XANAX ) 0.5 MG tablet, TAKE 1.5 PILLS IN MORNING, 1.5 A PILLS AT LUNCH, 1 AT DINNER, AND 1.5 PILLS AT BEDTIME, ALL AS NEEDED, Disp: 165 tablet, Rfl: 5   Artificial Tear Ointment (REFRESH P.M. OP), Place 1 application into both eyes at bedtime. Apply eye gel to  both eyelids, Disp: , Rfl:    aspirin 81 MG chewable tablet, Chew 81 mg by mouth daily., Disp: , Rfl:    atenolol (TENORMIN) 25 MG tablet, Take 12.5 mg by mouth 2 (two) times daily. , Disp: , Rfl:    budesonide (PULMICORT) 180 MCG/ACT inhaler, Inhale 2 puffs into the lungs 2 (two) times daily. , Disp: , Rfl:    COVID-19 mRNA bivalent vaccine, Pfizer, (PFIZER COVID-19 VAC BIVALENT) injection, Inject into the muscle. (Patient not taking: Reported on 04/30/2024), Disp: 0.3 mL, Rfl: 0   COVID-19 mRNA vaccine 2023-2024 (COMIRNATY ) syringe, Inject into the muscle. (Patient not taking: Reported on 04/30/2024), Disp: 0.3 mL, Rfl: 0   COVID-19 mRNA vaccine, Pfizer, (COMIRNATY ) syringe, Inject 0.3 mLs into the muscle. (Patient not taking: Reported on 04/30/2024), Disp: 0.3 mL, Rfl: 0   dicyclomine  (BENTYL ) 20 MG tablet, Take 1 tablet (20 mg total) by mouth 2 (two) times daily., Disp: 20 tablet, Rfl: 0   fluticasone (FLONASE) 50 MCG/ACT nasal spray, Place 1 spray into both nostrils daily as needed for allergies., Disp: , Rfl: 6   gabapentin (NEURONTIN) 100 MG capsule, Take 100 mg by mouth at bedtime as needed. (Patient not taking: Reported on 04/30/2024), Disp: , Rfl:    ipratropium (ATROVENT HFA) 17 MCG/ACT inhaler, Inhale 2 puffs into the lungs 4 (four) times daily., Disp: , Rfl:    levalbuterol (XOPENEX HFA) 45 MCG/ACT inhaler, Inhale 2 puffs into the lungs every 4 (four) hours as needed for wheezing., Disp: , Rfl:     Multiple Vitamin (MULTIVITAMIN WITH MINERALS) TABS, Take 1 tablet by mouth daily., Disp: , Rfl:    mupirocin ointment (BACTROBAN) 2 %, APPLY TO AFFECTED AREA 3 TIMES A DAY, Disp: , Rfl:    pantoprazole (PROTONIX) 40 MG tablet, Take 40 mg by mouth daily as needed (GERD). For stomach, Disp: , Rfl:    XDEMVY 0.25 % SOLN, , Disp: , Rfl:    ZIRGAN 0.15 % GEL, APPLY 1 A SMALL AMOUNT IN AFFECTED EYE 5 TIMES A DAY UNTIL DIRECTED, Disp: , Rfl:    Review of Systems     Objective:   Physical Exam        Assessment & Plan:

## 2024-07-30 ENCOUNTER — Ambulatory Visit: Admitting: Infectious Disease

## 2024-08-01 ENCOUNTER — Other Ambulatory Visit: Payer: Self-pay

## 2024-08-27 ENCOUNTER — Ambulatory Visit: Admitting: Psychiatry

## 2024-08-27 DIAGNOSIS — Z636 Dependent relative needing care at home: Secondary | ICD-10-CM

## 2024-08-27 DIAGNOSIS — F411 Generalized anxiety disorder: Secondary | ICD-10-CM

## 2024-08-27 DIAGNOSIS — F330 Major depressive disorder, recurrent, mild: Secondary | ICD-10-CM

## 2024-08-27 DIAGNOSIS — Z8659 Personal history of other mental and behavioral disorders: Secondary | ICD-10-CM
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
## 2024-09-06 ENCOUNTER — Ambulatory Visit: Admitting: Psychiatry

## 2024-09-06 DIAGNOSIS — F330 Major depressive disorder, recurrent, mild: Secondary | ICD-10-CM

## 2024-09-06 DIAGNOSIS — G8929 Other chronic pain: Secondary | ICD-10-CM | POA: Diagnosis not present

## 2024-09-06 DIAGNOSIS — A310 Pulmonary mycobacterial infection: Secondary | ICD-10-CM

## 2024-09-06 DIAGNOSIS — Z636 Dependent relative needing care at home: Secondary | ICD-10-CM

## 2024-09-06 DIAGNOSIS — F411 Generalized anxiety disorder: Secondary | ICD-10-CM | POA: Diagnosis not present

## 2024-09-06 DIAGNOSIS — M5440 Lumbago with sciatica, unspecified side: Secondary | ICD-10-CM | POA: Diagnosis not present

## 2024-09-06 DIAGNOSIS — Z8659 Personal history of other mental and behavioral disorders: Secondary | ICD-10-CM
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
## 2024-09-09 ENCOUNTER — Telehealth: Payer: Self-pay | Admitting: Physician Assistant

## 2024-09-09 NOTE — Telephone Encounter (Signed)
 Patient called in stating that she needs PA for Alprazolam  0.5mg . She will be getting new insurance in January and they are requesting the PA. Unable to put new insurance as it has not become effective yet. Please rtc [phone] Appt 2/2. Unable to put new insurance as it has not become effective yet

## 2024-09-23 ENCOUNTER — Ambulatory Visit: Admitting: Psychiatry

## 2024-09-23 DIAGNOSIS — Z636 Dependent relative needing care at home: Secondary | ICD-10-CM | POA: Diagnosis not present

## 2024-09-23 DIAGNOSIS — Z8659 Personal history of other mental and behavioral disorders: Secondary | ICD-10-CM | POA: Diagnosis not present

## 2024-09-23 DIAGNOSIS — F330 Major depressive disorder, recurrent, mild: Secondary | ICD-10-CM | POA: Diagnosis not present

## 2024-09-23 DIAGNOSIS — A310 Pulmonary mycobacterial infection: Secondary | ICD-10-CM | POA: Diagnosis not present

## 2024-09-23 DIAGNOSIS — F411 Generalized anxiety disorder: Secondary | ICD-10-CM | POA: Diagnosis not present

## 2024-09-23 NOTE — Progress Notes (Unsigned)
 Psychotherapy Progress Note [facility] [name], PhD LP  [name])    MRN: [id number] Therapy format: Individual psychotherapy Date: 09/23/2024      Start: 4:24p     Stop: 5:26p     Time Spent: 62 min Location: Telehealth visit -- I connected with this patient by an approved telecommunication method (video), with her informed consent, and verifying identity and patient privacy.  I was located at my office and patient at her home.  As needed, we discussed the limitations, risks, and security and privacy concerns associated with telehealth service, including the availability and conditions which currently govern in-person appointments and the possibility that 3rd-party payment may not be fully guaranteed and she may be responsible for charges.  After she indicated understanding, we proceeded with the session.  Also discussed treatment planning, as needed, including ongoing verbal agreement with the plan, the opportunity to ask and answer all questions, her demonstrated understanding of instructions, and her readiness to call the office should symptoms worsen or she feels she is in a crisis state and needs more immediate and tangible assistance.   Session narrative (presenting needs, interim history, self-report of stressors and symptoms, applications of prior therapy, status changes, and interventions made in session) Trouble getting online today but Tx also delayed by previous patient.  A bit annoyed at no replies since 2 weeks ago when she notified the office of the oncoming need for a prior auth on medication.  Worked out to message her provider, discovered administrative strictures in the office that prevent acting on it until new insurance kicks in, but was able to let her know message received and inform her what happens next.  Advised call in her info this week and specifically let staff know it's needed for the nurse to work on a prior auth,  possibly as soon as Friday.  Christmas was prepared to be down but turned out brighter than expected.  [name] was in a less mature place than usual, which turned out to be more childlike, happy.  Simplified holiday without much material gifting and no particular travel and visiting.  Alyse Race having an irrational reaction to cardiac ablation she's needed (complications found) and she pitched a fit about the handling of Christmas gifts while Lochlyn was indisposed.  Has been in good touch with [name], [name] widow, good to be support to he and to be relied on, both.  2nd Christmas since he died unexpectedly, able to normalize it for her, and able in some measure to fill [name] shoes as her confidante.  Validating to be entrusted more, the way [name] is lately, and some wanted companionship, as well as a supremely grateful card from her.  Niece Burnard is also in touch, which is gratifying, and good to see her building a life as a principal and raising a healthy family.  Aneita contrast to 2 other kids of [name] who estranged, and a kindred spirit within the family.  Finding to her satisfaction that she didn't think about [name] on Christmas.  Generous cash gift from [place] will bankroll a historical trip to [place], [place] for Minkler and [name] to see more about their colonial ancestors.    Also sweet to talk with her great nephew and great niece, to have them want to talk with her and recognize her as their Pacle's little sister and the 4yo to tell her he saw him.  Felt more truly like she's being allowed to have a  family again, and depression lifted with it.  Feels stronger for her caregiving stresses because of it, closer to accepting what may come with [name].    Pressing overtime again, agreed to table further issues.  Affirmed and encouraged working he caregiving situation and keeping her serenity with those who irritate her, and authority over unrealistic expectations.  Therapeutic modalities:  Cognitive Behavioral Therapy, Solution-Oriented/Positive Psychology, Ego-Supportive, and Humanistic/Existential  Mental Status/Observations:  Appearance:   Casual     Behavior:  Appropriate  Motor:  Normal  Speech/Language:   Clear and Coherent  Affect:  Appropriate  Mood:  normal  Thought process:  normal  Thought content:    WNL  Sensory/Perceptual disturbances:    WNL  Orientation:  Fully oriented  Attention:  Good    Concentration:  Good  Memory:  WNL  Insight:    Good  Judgment:   Good  Impulse Control:  Good   Risk Assessment: Danger to Self: No Self-injurious Behavior: No Danger to Others: No Physical Aggression / Violence: No Duty to Warn: No Access to Firearms a concern: No  Assessment of progress:  progressing  Diagnosis:   ICD-10-CM   1. Major depressive disorder, recurrent episode, mild with melancholic features  F33.0     2. Generalized anxiety disorder (with hx panic, agoraphobia, and PTSD)  F41.1     3. Caregiver stress  Z63.6     4. History of posttraumatic stress disorder (PTSD)  Z86.59     5. Mycobacterium avium complex (HCC)  A31.0      Plan:  Regrets/trauma hx OK to continue reminiscing, helpful to reach the good with the bad Remember, contextualize, forgive ad lib -- [name], parents, and herself Re bereavement and unfinished business with [name] self-affirm bereavement, good work done to reconcile, and constructive beliefs about [name] fate and God's care/concern, and continue tasks and needful communication with bereaved family.  Discretion about revealing to SIL that [name] in fear and probably misguided concern that he needed to spare her.  Options to talk or write to [facility] in absentia, pray, memorialize as she sees fit, reaffirm forgiveness posthumously. Phobias -- foodborne and airborne illness Reframe the moment she feels like avoiding food as her golden opportunity to say yes and to be in control every bit a  much as saying no Continue exposures to perceived risk of contamination she knows is minuscule or well-controlled Reduce arbitrary wait time for food, increase bites taken as a test experiment with broader and deeper intake Use imaginary [place] as coach when facing anxiety  Take food-related dreams as a sign of restoring normal appetite OK to use anorexic videos as inspiration to eat more normally instead  Try smelling spices and/or commitment to reopen a forgotten taste per week as ways to revive appetite rather than rely on moralizing with herself to do as she should Similarly, assess and relax restrictions and protocols on airborne illness, within reason Continue self-help through [name] website ad lib Physical health Evaluate as needed hearing loss Stretch liberally as able for orthopedic/spinal issues Observe reasonable standards for airborne infection control given her lung condition For sleep, continue to establish electronics curfew and willingness to turn in For weight maintenance, ensure good calorie density in food choices and sufficient quantity For health, ensure good variety, especially adequate vegetable and fruit nutrients, antioxidant content, and wheat OK Caregiver stress Concur with safety measures for REM Sleep Behavior Disorder and LBD Continue reframing PRN [name]'s inconvenient behaviors  as his second childhood, not her re-raising [name] or revisiting her put-upon childhood.  It does mean an imposed parenting role until he transitions to professional nursing care. Pursue reasonable collaborations with his treatment team for cognitive health Seek adequate time away from home and perpetual care -- may be opportunities safe enough, where [name] is capable of managing some time alone.  Look into respite care and facility and in-home care alternatives for [name] to get ahead on eventual developments. Make use as desired of caregiver support organization(s) As long  as his history of infidelity is not worth addressing explicitly, then view forgiveness as merely a way to unburden herself of resentment, not a pledge that nothing ever broke between them Social support Work assertiveness with friends to get adequate time listened to, not just falling into the automatic listener role Continuing discretion disclosing to friends and asking support and her turn  Manage her work/service and her limits with [name] and other organizations.   Assert as needed with friend [name].  OK to be direct, keep it about behavior and its better/worse effects.  Try to maintain benevolent interpretation, resist equating her with [name] or other treacherous personalities.  Assume not knowing before not caring.  Continue willing to ask as needed if anything is amiss, to demonstrate safety to talk, actually. Family strife Tasks of bereavement as indicated Continue stoic approach to son [name] resentment/rejection.  Refrain from drastic moves to block, just trust she's strong enough to non-reply if what she gets is actually unfair or aggressive. Self-affirm she can legitimately love the child and fear/guard against the adult he became without it being sinful Other recommendations/advice -- As may be noted above.  Continue to utilize previously learned skills ad lib. Medication compliance -- Maintain medication as prescribed and work faithfully with relevant prescriber(s) if any changes are desired or seem indicated. Crisis service -- Aware of call list and work-in appts.  Call the clinic on-call service, 988/hotline, 911, or present to [facility] or ER if any life-threatening psychiatric crisis. Followup -- Return for time as already scheduled, avail earlier @ PT's need.  Next scheduled visit with me 10/18/2024.  Next scheduled in this office 10/18/2024.  Lamar Kendall, PhD [name], PhD LP Clinical Psychologist, [facility] [address] [place], [place] [address] [phone]

## 2024-09-27 ENCOUNTER — Telehealth: Payer: Self-pay | Admitting: Physician Assistant

## 2024-09-27 NOTE — Telephone Encounter (Signed)
 Sounds like this is just a PA request

## 2024-09-27 NOTE — Telephone Encounter (Signed)
 Patient's insurance [employer] called in stating they need medical necessity form info faxed over for Alprazolam  0.5mg . Prescription is for 165 tablets but insurance can only fill 92-100 tablets. Fax [phone]

## 2024-09-27 NOTE — Telephone Encounter (Signed)
 Pt has new insurance per last phone note. Is that in our system now?

## 2024-09-27 NOTE — Telephone Encounter (Signed)
 PA approved Alprazolam  0.5 mg #165/30 day [facility] 09/26/24-09/25/25 EJ#[id number]

## 2024-10-01 NOTE — Telephone Encounter (Signed)
 Pt said she needed a PA for qty. She said insurance will only cover 120 and she gets 165. Pharmacy said they filled for #165.

## 2024-10-18 ENCOUNTER — Ambulatory Visit: Admitting: Psychiatry

## 2024-10-18 DIAGNOSIS — F330 Major depressive disorder, recurrent, mild: Secondary | ICD-10-CM | POA: Diagnosis not present

## 2024-10-18 DIAGNOSIS — F411 Generalized anxiety disorder: Secondary | ICD-10-CM

## 2024-10-18 DIAGNOSIS — F422 Mixed obsessional thoughts and acts: Secondary | ICD-10-CM

## 2024-10-18 DIAGNOSIS — Z636 Dependent relative needing care at home: Secondary | ICD-10-CM

## 2024-10-18 NOTE — Progress Notes (Signed)
 Psychotherapy Progress Note [facility] Sandra Kendall, PhD LP  [name])    MRN: [id number] Therapy format: Individual psychotherapy Date: 10/18/2024      Start: 2:08p     Stop: 3:16p     Time Spent: 68 min Location: In-person   Session narrative (presenting needs, interim history, self-report of stressors and symptoms, applications of prior therapy, status changes, and interventions made in session) When reached, Sandra Burns was present to hold her place for her and in a distinctly chipper mood.  Made small talk about the ice storm coming and says for himself that he is on something new for LBD (memantine) for 30 days and noticing improvement, impressed with his provider.  Affirmed and encouraged, while Sandra Burns fought her frustration/irritation and worked through   [employer] came on 2:13p after, c/o [employer], and tells of an excursion yesterday to see the monks on their peace walk through the vicinity.  Enjoyable, though Sandra Burns couldn't keep up, and Sandra Burns tried to pee in my corn flakes by throwing off on them not being Christian and going to hell...  Affirmed the adventure and the mood lift she got out of it.  Has been in pain and angry recently in the ramp up to her birthday, recalls how F used to stress at holidays and her birthday use to get postponed for weather and/or deeply double-messaged about whether she was wanted.  Sandra Burns [facility], who tends to be needy, hyperactive, and intrusive and is a zealous return Catholic who overshares, has been in bloom around her birthday yesterday.  Kind of exhausting, as an introvert taking over 70 calls and messages.  Many original, but still risks being overstimulated.  Feels guilty about having such mixed feelings, but assured it's all normal.  Acknowledges Sandra Burns is having better good days when he has them, credit to memantine.    Insight in session that F may have been adversely affected by kids'  birthdays because they reminded him of Sandra Burns, who only lived one day.    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  Coping with a rather research scientist (physical sciences), and an online meeting that turned chaotic (enough for her to call them to order, and to feel guilty for maybe coming on too strong.  Form the sound of it no, they were just surprised, encouraged to settle with her own authority.  Writing haikus again, on theme of something peaceful every day.  New pet name for the president also working to help her stomach the latest abuses and offensive developments in the news.  Affirmed and encouraged.  Therapeutic modalities: Cognitive Behavioral Therapy, Solution-Oriented/Positive Psychology, and Ego-Supportive  Mental Status/Observations:  Appearance:   Casual     Behavior:  Appropriate  Motor:  Normal  Speech/Language:   Clear and Coherent  Affect:  Appropriate  Mood:  Somewhat stressed, responsive   Thought process:  normal  Thought content:    WNL  Sensory/Perceptual disturbances:    WNL  Orientation:  Fully oriented  Attention:  Good    Concentration:  Fair  Memory:  WNL  Insight:    Good  Judgment:   Good  Impulse Control:  Good   Risk Assessment: Danger to Self: No Self-injurious Behavior: No Danger to Others: No Physical  Aggression / Violence: No Duty to Warn: No Access to Firearms a concern: No  Assessment of progress:  progressing  Diagnosis:   ICD-10-CM   1. Major depressive disorder, recurrent episode, mild with melancholic features  F33.0     2. Generalized anxiety disorder (with hx panic, agoraphobia, and PTSD)  F41.1     3. Caregiver stress  Z63.6     4. 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  Discretion about revealing to SIL that Sandra Burns had been consulting her in fear and probably misguided concern that he needed to spare her.  Options to talk or write to [name] in absentia, pray, memorialize as she sees fit, reaffirm forgiveness posthumously. Phobias -- foodborne and airborne illness Reframe the moment she feels like avoiding food as her golden opportunity to say yes and to be in control every bit a much as saying no Continue exposures to perceived risk of contamination she knows is minuscule or well-controlled Reduce arbitrary wait time for food, increase bites taken as a test experiment with broader and deeper intake Use imaginary [place] as coach when facing anxiety  Take food-related dreams as a sign of restoring normal appetite OK to use anorexic videos as inspiration to eat more normally instead  Try smelling spices and/or commitment to reopen a forgotten taste per week as ways to revive appetite rather than rely on moralizing with herself to do as she should Similarly, assess and relax restrictions and protocols on airborne illness, within reason Continue self-help through [name] website ad lib Physical health Evaluate as needed hearing loss Stretch liberally as able for orthopedic/spinal issues Observe reasonable standards for airborne infection control given her lung condition For sleep, continue to establish electronics curfew and willingness to turn in For weight maintenance, ensure good calorie density in food choices and sufficient quantity For health, ensure good variety, especially adequate vegetable and fruit nutrients,  antioxidant content, and wheat OK Caregiver stress 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  It does mean an imposed parenting role until he transitions to professional nursing care. Pursue reasonable collaborations with his treatment team for cognitive health Seek adequate time away from home and perpetual care -- may be opportunities safe enough, where Sandra Burns is capable of managing some time alone.  Look into respite care and facility and in-home care alternatives for Sandra Burns to get ahead on eventual developments. 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   Assert as needed with friend [name].  OK to be direct, keep it about behavior and its better/worse effects.  Try to maintain benevolent interpretation, resist equating her with [name] or other treacherous personalities.  Assume not knowing before not caring.  Continue willing to ask as needed if anything is amiss, to demonstrate safety to talk, actually. Family strife Tasks of bereavement as indicated Continue stoic approach to son [name] resentment/rejection.  Refrain from drastic moves to block, just trust she's strong enough to non-reply if what she gets is actually unfair or aggressive. Self-affirm she can legitimately love the child and fear/guard against the adult he became without it  being sinful Other recommendations/advice -- As may be noted above.  Continue to utilize previously learned skills ad lib. Medication compliance -- Maintain medication as prescribed and work faithfully with relevant prescriber(s) if any changes are desired or seem indicated. Crisis service -- Aware of call list and work-in appts.  Call the clinic on-call service, 988/hotline, 911, or present to [facility] or ER if any life-threatening psychiatric crisis. Followup -- Return for time as already scheduled.  Next scheduled visit with me 10/28/2024.  Next scheduled in this office 10/28/2024.  Lamar Kendall, PhD Sandra Kendall, PhD LP Clinical Psychologist, [facility] [address] [place], [place] [address] [phone]

## 2024-10-24 NOTE — Progress Notes (Incomplete)
 Psychotherapy Progress Note [facility] [name], PhD LP  [name])    MRN: [id number] Therapy format: Individual psychotherapy Date: 10/18/2024      Start: 2:08p     Stop: 3:16p     Time Spent: 68 min Location: In-person   Session narrative (presenting needs, interim history, self-report of stressors and symptoms, applications of prior therapy, status changes, and interventions made in session) When reached, Sandra Burns was present to hold her place for her and in a distinctly chipper mood.  Made small talk about the ice storm coming and says for himself that he is on something new for LBD (memantine) for 30 days and noticing improvement, impressed with his provider.  Affirmed and encouraged, while Sandra Burns and worked through   [employer] came on 2:13p after, c/o Sandra Burns, and tells of an excursion yesterday to see the monks on their peace walk through the vicinity.  Enjoyable, though Sandra Burns couldn't keep up, and Sandra Burns tried to pee in my corn flakes by throwing off on them not being Christian and going to hell...  Affirmed the adventure and the mood lift she got out of it.  Has been in pain and angry recently in the ramp up to her birthday, recalls how F used to stress at holidays and her birthday use to get postponed for weather and/or deeply double-messaged about whether she was wanted.  Sandra Burns, who tends to be needy, hyperactive, and intrusive and is a zealous return Catholic who overshares, has been in bloom around her birthday yesterday.  Kind of exhausting, as an introvert taking over 70 calls and messages.  Many original, but still risks being overstimulated.  Feels guilty about having such mixed feelings, but assured it's all normal.  Acknowledges Sandra Burns is having better good days when he has them, credit to memantine.    Insight in session that F may have been adversely affected by kids'  birthdays because they reminded him of Sandra Burns, who only lived one day.    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  One involves a new plaque installation and state and local dignitaries at the [employer] site, a state [place], and a number of committees, in October.  Coping with a rather research scientist (physical sciences), and an online meeting that turned chaotic (enough for her to call them to order, and to feel guilty   Writing haikus again, on theme of something peaceful every day.  New pet name for the president working to help stomach the latest offensive developments.    Therapeutic modalities: {AM:23362::Cognitive Behavioral Therapy,Solution-Oriented/Positive Psychology}  Mental Status/Observations:  Appearance:   {PSY:22683}     Behavior:  {PSY:21022743}  Motor:  {PSY:22302}  Speech/Language:   {PSY:22685}  Affect:  {PSY:22687}  Mood:  {PSY:31886}  Thought process:  {PSY:31888}  Thought content:    {PSY:212-145-6322}  Sensory/Perceptual disturbances:    {PSY:816-526-7697}  Orientation:  {Psych Orientation:23301::Fully oriented}  Attention:  {Good-Fair-Poor ratings:23770::Good}    Concentration:  {Good-Fair-Poor ratings:23770::Good}  Memory:  {PSY:269-526-4188}  Insight:    {Good-Fair-Poor ratings:23770::Good}  Judgment:   {Good-Fair-Poor ratings:23770::Good}  Impulse Control:  {Good-Fair-Poor ratings:23770::Good}   Risk Assessment: Danger to Self: {Risk:22599::No} Self-injurious Behavior: {Risk:22599::No} Danger to Others: {Risk:22599::No} Physical Aggression / Violence: {Risk:22599::No} Duty to Warn: {AMYesNo:22526::No} Access to Firearms a concern: {AMYesNo:22526::No}  Assessment of progress:  {Progress:22147::progressing}  Diagnosis:   ICD-10-CM   1. Major depressive disorder, recurrent episode, mild  with melancholic features  F33.0     2. Generalized anxiety disorder (with hx panic, agoraphobia, and PTSD)   F41.1     3. Caregiver stress  Z63.6     4. OCD - contamination/illness type  F42.2      Plan:  Regrets/trauma hx OK to continue reminiscing, helpful to reach the good with the bad Remember, contextualize, forgive ad lib -- Sandra Burns, parents, and herself Re bereavement and unfinished business with Sandra Burns -- self-affirm bereavement, good work done to reconcile, and constructive beliefs about Sandra Burns and Sandra Burns's care/concern, and continue tasks and needful communication with bereaved family.  Discretion about revealing to SIL that Sandra Burns had been consulting her in fear and probably misguided concern that he needed to spare her.  Options to talk or write to Sandra Burns in absentia, pray, memorialize as she sees fit, reaffirm forgiveness posthumously. Phobias -- foodborne and airborne illness Reframe the moment she feels like avoiding food as her golden opportunity to say yes and to be in control every bit a much as saying no Continue exposures to perceived risk of contamination she knows is minuscule or well-controlled Reduce arbitrary wait time for food, increase bites taken as a test experiment with broader and deeper intake Use imaginary [place] as coach when facing anxiety  Take food-related dreams as a sign of restoring normal appetite OK to use anorexic videos as inspiration to eat more normally instead  Try smelling spices and/or commitment to reopen a forgotten taste per week as ways to revive appetite rather than rely on moralizing with herself to do as she should Similarly, assess and relax restrictions and protocols on airborne illness, within reason Continue self-help through Sandra Burns website ad lib Physical health Evaluate as needed hearing loss Stretch liberally as able for orthopedic/spinal issues Observe reasonable standards for airborne infection control given her lung condition For sleep, continue to establish electronics curfew and willingness to turn in For weight  maintenance, ensure good calorie density in food choices and sufficient quantity For health, ensure good variety, especially adequate vegetable and fruit nutrients, antioxidant content, and wheat OK Caregiver stress 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  It does mean an imposed parenting role until he transitions to professional nursing care. Pursue reasonable collaborations with his treatment team for cognitive health Seek adequate time away from home and perpetual care -- may be opportunities safe enough, where Sandra Burns is capable of managing some time alone.  Look into respite care and facility and in-home care alternatives for Sandra Burns to get ahead on eventual developments. 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   Assert as needed with friend Sandra Burns.  OK to be direct, keep it about behavior and its better/worse effects.  Try to maintain benevolent interpretation, resist equating her with Sandra Burns or other treacherous personalities.  Assume not knowing before not caring.  Continue willing to ask as needed if anything is amiss, to  demonstrate safety to talk, actually. Family strife Tasks of bereavement as indicated Continue stoic approach to son Sandra Burns's resentment/rejection.  Refrain from drastic moves to block, just trust she's strong enough  to non-reply if what she gets is actually unfair or aggressive. Self-affirm she can legitimately love the child and fear/guard against the adult he became without it being sinful Other recommendations/advice -- As may be noted above.  Continue to utilize previously learned skills ad lib. Medication compliance -- Maintain medication as prescribed and work faithfully with relevant prescriber(s) if any changes are desired or seem indicated. Crisis service -- Aware of call list and work-in appts.  Call the clinic on-call service, 988/hotline, 911, or present to [facility] or ER if any life-threatening psychiatric crisis. Followup -- Return for time as already scheduled.  Next scheduled visit with me 10/28/2024.  Next scheduled in this office 10/28/2024.  Lamar Kendall, PhD [name], PhD LP Clinical Psychologist, [facility] [address] [place], [place] [address] [phone]

## 2024-10-28 ENCOUNTER — Encounter: Payer: Self-pay | Admitting: Physician Assistant

## 2024-10-28 ENCOUNTER — Telehealth: Admitting: Physician Assistant

## 2024-10-28 ENCOUNTER — Ambulatory Visit: Admitting: Psychiatry

## 2024-10-28 DIAGNOSIS — Z634 Disappearance and death of family member: Secondary | ICD-10-CM

## 2024-10-28 DIAGNOSIS — F411 Generalized anxiety disorder: Secondary | ICD-10-CM

## 2024-10-28 DIAGNOSIS — F330 Major depressive disorder, recurrent, mild: Secondary | ICD-10-CM

## 2024-10-28 DIAGNOSIS — Z636 Dependent relative needing care at home: Secondary | ICD-10-CM

## 2024-10-28 DIAGNOSIS — F4310 Post-traumatic stress disorder, unspecified: Secondary | ICD-10-CM

## 2024-10-28 MED ORDER — ALPRAZOLAM 0.5 MG PO TABS: ORAL_TABLET | ORAL | 5 refills | Status: AC

## 2024-10-28 NOTE — Progress Notes (Signed)
 Psychotherapy Progress Note [facility] Sandra Kendall, PhD LP  [name])    MRN: [id number] Therapy format: Individual psychotherapy Date: 10/28/2024      Start: 3:06p     Stop: 4:11p     Time Spent: 65 min Location: Telehealth visit -- I connected with this patient by an approved telecommunication method (video), with her informed consent, and verifying identity and patient privacy.  I was located at my home and patient at her home.  As needed, we discussed the limitations, risks, and security and privacy concerns associated with telehealth service, including the availability and conditions which currently govern in-person appointments and the possibility that 3rd-party payment may not be fully guaranteed and she may be responsible for charges.  After she indicated understanding, we proceeded with the session.  Also discussed treatment planning, as needed, including ongoing verbal agreement with the plan, the opportunity to ask and answer all questions, her demonstrated understanding of instructions, and her readiness to call the office should symptoms worsen or she feels she is in a crisis state and needs more immediate and tangible assistance.   Session narrative (presenting needs, interim history, self-report of stressors and symptoms, applications of prior therapy, status changes, and interventions made in session) Reached at home, per usual, on a work-from-home day in which she previously saw psychiatry.  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  Apparently a political issue with national implications around transgender (MTF) rights to participate, under the law.  News that a large chapter nearby split over whether to prioritize adherence to the charter, which, like the [employer], could be a proxy for  accepting vs. unaccepting.  Personally, relieved to lose the frustrations of the woman domineering, sequestering documents, casting aspersions about the state regent, and otherwise practicing what reminds her of oppositional-defiant child behavior, among other things.  Reminiscent of Sandra Burns, actually, but substantially more power-addicted.  Feels better able to speak truth and stand up for her boundaries without resorting to anger for the work we've done.  Example in getting her friend Sandra Burns to pause and let her finish her turn.  Symbolically large for Sandra Burns, in contrast to long history back to childhood of swallowing her voice.  Sees good stress coming organizing by herself in the near term.    Stress of working with Sandra Burns is noticeably down as he responds to memantine.  Proud of herself for not freaking out about medication continuity issues, and for taking times to get herself out of the flow of to-dos and in better tough with sunlight during winter.  Doing better about keeping natural light available in the work space.  Finding herself better able to let things pass, not ruminate on them, and yet be able to address things that do require some problem-solving.  Rates herself about 6-7/10 fluent about recognizing when something should be controlled vs surrendered.  Remains grateful, and affirming, at herself for forgiving Sandra Burns -- would not have had a number of gifts of his last 5 years had she not.    Recognizes she more or less deified her mother but came to resent her, and that -- being angry at her -- is pointedly ego-dystonic.  Realized in session that she used to tell herself she'd better behave perfectly or she could get abandoned, and times when M  forgot to pick her up spiked this pattern.  Encouraged to consider what she might offer if her mother could visit, in person, and -- benefit of heavenly experience -- ready to hear anything, ready to wait if she needs, and ready  to acknowledge whatever is needful.    Therapeutic modalities: Cognitive Behavioral Therapy, Solution-Oriented/Positive Psychology, and Ego-Supportive  Mental Status/Observations:  Appearance:   Casual     Behavior:  Appropriate  Motor:  Normal  Speech/Language:   Clear and Coherent  Affect:  Appropriate  Mood:  Mildly dysthymic  Thought process:  normal  Thought content:    WNL  Sensory/Perceptual disturbances:    WNL  Orientation:  Fully oriented  Attention:  Good    Concentration:  Good  Memory:  WNL  Insight:    Good  Judgment:   Good  Impulse Control:  Good   Risk Assessment: Danger to Self: No Self-injurious Behavior: No Danger to Others: No Physical Aggression / Violence: No Duty to Warn: No Access to Firearms a concern: No  Assessment of progress:  progressing  Diagnosis:   ICD-10-CM   1. Generalized anxiety disorder (with hx panic, agoraphobia, and PTSD)  F41.1     2. Major depressive disorder, recurrent episode, mild with melancholic features  F33.0     3. Caregiver stress  Z63.6     4. Bereavement  Z63.4      Plan:  Regrets/trauma hx OK to continue reminiscing, helpful to reach the good with the bad Remember, contextualize, forgive ad lib -- Sandra Burns, parents, and herself Re bereavement and unfinished business with Sandra Burns -- self-affirm bereavement, good work done to reconcile, and constructive beliefs about Sandra Burns's fate and God's care/concern, and continue tasks and needful communication with bereaved family.  Discretion about revealing to SIL that Sandra Burns had been consulting her in fear and probably misguided concern that he needed to spare her.  Options to talk or write to [facility] in absentia, pray, memorialize as she sees fit, reaffirm forgiveness posthumously. Phobias -- foodborne and airborne illness Reframe the moment she feels like avoiding food as her golden opportunity to say yes and to be in control every bit a much as saying no Continue  exposures to perceived risk of contamination she knows is minuscule or well-controlled Reduce arbitrary wait time for food, increase bites taken as a test experiment with broader and deeper intake Use imaginary [place] as coach when facing anxiety  Take food-related dreams as a sign of restoring normal appetite OK to use anorexic videos as inspiration to eat more normally instead  Try smelling spices and/or commitment to reopen a forgotten taste per week as ways to revive appetite rather than rely on moralizing with herself to do as she should Similarly, assess and relax restrictions and protocols on airborne illness, within reason Continue self-help through [name] website ad lib Physical health Evaluate as needed hearing loss Stretch liberally as able for orthopedic/spinal issues Observe reasonable standards for airborne infection control given her lung condition For sleep, continue to establish electronics curfew and willingness to turn in For weight maintenance, ensure good calorie density in food choices and sufficient quantity For health, ensure good variety, especially adequate vegetable and fruit nutrients, antioxidant content, and wheat OK Caregiver stress Concur with 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  It does mean an imposed parenting role until he transitions to  professional nursing care. Pursue reasonable collaborations with his treatment team for cognitive health Seek adequate time away from home and perpetual care -- may be opportunities safe enough, where Sandra Burns is capable of managing some time alone.  Look into respite care and facility and in-home care alternatives for Sandra Burns to get ahead on eventual developments. 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   Assert as needed with friend [name].  OK to be direct, keep it about behavior and its better/worse effects.  Try to maintain benevolent interpretation, resist equating her with Sandra Burns or other treacherous personalities.  Assume not knowing before not caring.  Continue willing to ask as needed if anything is amiss, to demonstrate safety to talk, actually. Family strife Tasks of bereavement as indicated Continue stoic approach to son Sandra Burns's resentment/rejection.  Refrain from drastic moves to block, just trust she's strong enough to non-reply if what she gets is actually unfair or aggressive. Self-affirm she can legitimately love the child and fear/guard against the adult he became without it being sinful Other recommendations/advice -- As may be noted above.  Continue to utilize previously learned skills ad lib. Medication compliance -- Maintain medication as prescribed and work faithfully with relevant prescriber(s) if any changes are desired or seem indicated. Crisis service -- Aware of call list and work-in appts.  Call the clinic on-call service, 988/hotline, 911, or present to [facility] or ER if any life-threatening psychiatric crisis. Followup -- Return for time as already scheduled.  Next scheduled visit with me 11/11/2024.  Next scheduled in this office 11/11/2024.  Lamar Kendall, PhD Sandra Kendall, PhD LP Clinical Psychologist, [facility] [address] [place], [place] [address] [phone]

## 2024-11-11 ENCOUNTER — Ambulatory Visit: Admitting: Psychiatry

## 2024-11-28 ENCOUNTER — Ambulatory Visit: Admitting: Psychiatry

## 2024-12-13 ENCOUNTER — Ambulatory Visit: Admitting: Psychiatry

## 2024-12-26 ENCOUNTER — Ambulatory Visit: Admitting: Psychiatry

## 2025-04-28 ENCOUNTER — Ambulatory Visit: Admitting: Physician Assistant
